# Patient Record
Sex: Female | Born: 1955
Health system: Southern US, Community
[De-identification: ages and names within clinical notes are randomized; demographics above are authoritative.]

## PROBLEM LIST (undated history)

## (undated) DIAGNOSIS — A6000 Herpesviral infection of urogenital system, unspecified: Secondary | ICD-10-CM

## (undated) DIAGNOSIS — E079 Disorder of thyroid, unspecified: Secondary | ICD-10-CM

## (undated) DIAGNOSIS — H3530 Unspecified macular degeneration: Secondary | ICD-10-CM

## (undated) HISTORY — DX: Unspecified macular degeneration: H35.30

## (undated) HISTORY — DX: Disorder of thyroid, unspecified: E07.9

## (undated) HISTORY — DX: Herpesviral infection of urogenital system, unspecified: A60.00

---

## 2002-11-12 ENCOUNTER — Encounter: Payer: Self-pay | Admitting: Family Medicine

## 2002-11-12 ENCOUNTER — Encounter: Admission: RE | Admit: 2002-11-12 | Discharge: 2002-11-12 | Payer: Self-pay | Admitting: Family Medicine

## 2002-11-28 ENCOUNTER — Encounter: Admission: RE | Admit: 2002-11-28 | Discharge: 2002-11-28 | Payer: Self-pay | Admitting: Family Medicine

## 2002-11-28 ENCOUNTER — Encounter: Payer: Self-pay | Admitting: Family Medicine

## 2003-07-24 ENCOUNTER — Encounter: Admission: RE | Admit: 2003-07-24 | Discharge: 2003-07-24 | Payer: Self-pay | Admitting: Family Medicine

## 2003-11-06 ENCOUNTER — Other Ambulatory Visit: Admission: RE | Admit: 2003-11-06 | Discharge: 2003-11-06 | Payer: Self-pay | Admitting: Family Medicine

## 2003-11-25 ENCOUNTER — Encounter: Admission: RE | Admit: 2003-11-25 | Discharge: 2003-11-25 | Payer: Self-pay | Admitting: Family Medicine

## 2004-07-25 ENCOUNTER — Encounter: Admission: RE | Admit: 2004-07-25 | Discharge: 2004-07-25 | Payer: Self-pay | Admitting: Family Medicine

## 2004-09-06 ENCOUNTER — Other Ambulatory Visit: Admission: RE | Admit: 2004-09-06 | Discharge: 2004-09-06 | Payer: Self-pay | Admitting: Family Medicine

## 2005-09-01 ENCOUNTER — Ambulatory Visit (HOSPITAL_COMMUNITY): Admission: RE | Admit: 2005-09-01 | Discharge: 2005-09-01 | Payer: Self-pay | Admitting: Family Medicine

## 2006-07-05 ENCOUNTER — Other Ambulatory Visit: Admission: RE | Admit: 2006-07-05 | Discharge: 2006-07-05 | Payer: Self-pay | Admitting: Family Medicine

## 2006-09-20 ENCOUNTER — Ambulatory Visit (HOSPITAL_COMMUNITY): Admission: RE | Admit: 2006-09-20 | Discharge: 2006-09-20 | Payer: Self-pay | Admitting: Family Medicine

## 2007-09-23 ENCOUNTER — Encounter: Admission: RE | Admit: 2007-09-23 | Discharge: 2007-09-23 | Payer: Self-pay | Admitting: Family Medicine

## 2008-09-24 ENCOUNTER — Ambulatory Visit (HOSPITAL_COMMUNITY): Admission: RE | Admit: 2008-09-24 | Discharge: 2008-09-24 | Payer: Self-pay | Admitting: Family Medicine

## 2009-09-24 ENCOUNTER — Ambulatory Visit (HOSPITAL_COMMUNITY): Admission: RE | Admit: 2009-09-24 | Discharge: 2009-09-24 | Payer: Self-pay | Admitting: Family Medicine

## 2010-07-10 ENCOUNTER — Encounter: Payer: Self-pay | Admitting: Family Medicine

## 2010-08-22 ENCOUNTER — Other Ambulatory Visit (HOSPITAL_COMMUNITY): Payer: Self-pay | Admitting: Family Medicine

## 2010-08-22 DIAGNOSIS — Z1231 Encounter for screening mammogram for malignant neoplasm of breast: Secondary | ICD-10-CM

## 2010-09-26 ENCOUNTER — Ambulatory Visit (HOSPITAL_COMMUNITY): Payer: BC Managed Care – PPO

## 2010-10-03 ENCOUNTER — Ambulatory Visit (HOSPITAL_COMMUNITY)
Admission: RE | Admit: 2010-10-03 | Discharge: 2010-10-03 | Disposition: A | Discharge status: Home / Self Care | Payer: BC Managed Care – PPO | Source: Ambulatory Visit | Attending: Family Medicine | Admitting: Family Medicine

## 2010-10-03 DIAGNOSIS — Z1231 Encounter for screening mammogram for malignant neoplasm of breast: Secondary | ICD-10-CM | POA: Insufficient documentation

## 2011-08-30 ENCOUNTER — Other Ambulatory Visit (HOSPITAL_COMMUNITY): Payer: Self-pay | Admitting: Family Medicine

## 2011-08-30 DIAGNOSIS — Z1231 Encounter for screening mammogram for malignant neoplasm of breast: Secondary | ICD-10-CM

## 2011-10-04 ENCOUNTER — Ambulatory Visit (HOSPITAL_COMMUNITY)
Admission: RE | Admit: 2011-10-04 | Discharge: 2011-10-04 | Disposition: A | Discharge status: Home / Self Care | Payer: BC Managed Care – PPO | Source: Ambulatory Visit | Attending: Family Medicine | Admitting: Family Medicine

## 2011-10-04 DIAGNOSIS — Z1231 Encounter for screening mammogram for malignant neoplasm of breast: Secondary | ICD-10-CM | POA: Insufficient documentation

## 2012-09-02 ENCOUNTER — Other Ambulatory Visit (HOSPITAL_COMMUNITY): Payer: Self-pay | Admitting: Family Medicine

## 2012-09-02 DIAGNOSIS — Z1231 Encounter for screening mammogram for malignant neoplasm of breast: Secondary | ICD-10-CM

## 2012-10-08 ENCOUNTER — Ambulatory Visit (HOSPITAL_COMMUNITY): Payer: BC Managed Care – PPO

## 2013-09-30 ENCOUNTER — Ambulatory Visit (HOSPITAL_COMMUNITY)
Admission: RE | Admit: 2013-09-30 | Discharge: 2013-09-30 | Disposition: A | Discharge status: Home / Self Care | Payer: BC Managed Care – PPO | Source: Ambulatory Visit | Attending: Family Medicine | Admitting: Family Medicine

## 2013-09-30 DIAGNOSIS — Z1231 Encounter for screening mammogram for malignant neoplasm of breast: Secondary | ICD-10-CM

## 2013-11-21 ENCOUNTER — Encounter: Payer: Self-pay | Admitting: Sports Medicine

## 2013-11-21 ENCOUNTER — Ambulatory Visit (INDEPENDENT_AMBULATORY_CARE_PROVIDER_SITE_OTHER): Payer: BC Managed Care – PPO | Admitting: Sports Medicine

## 2013-11-21 VITALS — BP 113/72 | HR 82 | Wt 134.0 lb

## 2013-11-21 DIAGNOSIS — Q667 Congenital pes cavus, unspecified foot: Secondary | ICD-10-CM

## 2013-11-21 DIAGNOSIS — IMO0002 Reserved for concepts with insufficient information to code with codable children: Secondary | ICD-10-CM

## 2013-11-21 DIAGNOSIS — M5416 Radiculopathy, lumbar region: Secondary | ICD-10-CM | POA: Insufficient documentation

## 2013-11-21 MED ORDER — MELOXICAM 15 MG PO TABS: ORAL_TABLET | ORAL | Status: DC

## 2013-11-21 MED ORDER — PREDNISONE 50 MG PO TABS: ORAL_TABLET | ORAL | Status: DC

## 2013-11-21 NOTE — Assessment & Plan Note (Signed)
Custom orthotics.

## 2013-11-21 NOTE — Assessment & Plan Note (Signed)
Prednisone, Mobic, formal physical therapy, x-rays. Return in a month, MRI for interventional injection planning if no better.

## 2013-11-21 NOTE — Progress Notes (Addendum)
    Patient was fitted for a : standard, cushioned, semi-rigid orthotic. The orthotic was heated and afterward the patient stood on the orthotic blank positioned on the orthotic stand. The patient was positioned in subtalar neutral position and 10 degrees of ankle dorsiflexion in a weight bearing stance. After completion of molding, a stable base was applied to the orthotic blank. The blank was ground to a stable position for weight bearing. Size: 9 Base: Blue EVA Additional Posting and Padding: None The patient ambulated these, and they were very comfortable.  I spent 45 minutes with this patient, greater than 50% was face-to-face time counseling regarding the below diagnosis.

## 2013-12-10 ENCOUNTER — Ambulatory Visit: Payer: BC Managed Care – PPO | Admitting: Physical Therapy

## 2013-12-11 ENCOUNTER — Telehealth: Payer: Self-pay | Admitting: *Deleted

## 2013-12-11 NOTE — Telephone Encounter (Signed)
The orthotic is molded exactly to her foot so its likely perfect, I don't think i'd make another set any differently.  I would recommend making her shoe really right since if the foot slides around it can pull the sock down.  Also maybe a taller sock? Or no Socks?

## 2013-12-11 NOTE — Telephone Encounter (Signed)
Pt called and said she got orthotics from you a couple weeks ago and no matter what shoes or socks she tries her sock is pulled down by orthotic.  Does she need a new one made?  KG CMA

## 2013-12-22 ENCOUNTER — Ambulatory Visit: Payer: BC Managed Care – PPO | Admitting: Sports Medicine

## 2014-09-04 ENCOUNTER — Other Ambulatory Visit (HOSPITAL_COMMUNITY): Payer: Self-pay | Admitting: Family Medicine

## 2014-09-04 DIAGNOSIS — Z1231 Encounter for screening mammogram for malignant neoplasm of breast: Secondary | ICD-10-CM

## 2014-10-07 ENCOUNTER — Ambulatory Visit (HOSPITAL_COMMUNITY)
Admission: RE | Admit: 2014-10-07 | Discharge: 2014-10-07 | Disposition: A | Discharge status: Home / Self Care | Payer: BC Managed Care – PPO | Source: Ambulatory Visit | Attending: Family Medicine | Admitting: Family Medicine

## 2014-10-07 DIAGNOSIS — Z1231 Encounter for screening mammogram for malignant neoplasm of breast: Secondary | ICD-10-CM | POA: Insufficient documentation

## 2015-04-14 ENCOUNTER — Encounter: Payer: Self-pay | Admitting: Sports Medicine

## 2015-04-14 ENCOUNTER — Ambulatory Visit (INDEPENDENT_AMBULATORY_CARE_PROVIDER_SITE_OTHER): Payer: BC Managed Care – PPO | Admitting: Sports Medicine

## 2015-04-14 VITALS — BP 143/59 | HR 86 | Wt 138.0 lb

## 2015-04-14 DIAGNOSIS — M25531 Pain in right wrist: Secondary | ICD-10-CM | POA: Diagnosis not present

## 2015-04-14 MED ORDER — NAPROXEN-ESOMEPRAZOLE 500-20 MG PO TBEC: 1.0000 | DELAYED_RELEASE_TABLET | Freq: Two times a day (BID) | ORAL | Status: DC

## 2015-04-14 NOTE — Assessment & Plan Note (Signed)
Multifactorial, there is a degree of osteoarthritis, and likely some carpal tunnel syndrome. She will wear her wrist brace at night and when possible work, adding Vimovo samples due to history of PUD, and formal physical therapy.  Declines x-ray, return in one month.

## 2015-04-14 NOTE — Progress Notes (Signed)
  Subjective:    CC: Right wrist pain  HPI: This is a pleasant 59 year old female, for the past month she has had pain that she localizes on the volar aspect of the right wrist, moderate, persistent with radiation into the forearm, no numbness or tingling into the hands but she simply feels tight. She has a job consists of repetitive motions, she did file a Sport and exercise psychologist. claim and was denied. Seen by nurse practitioner and diagnosed as tendinitis and given a wrist brace. She was taken out of work, currently she wants to return to work, wrist brace helps, not taking any NSAIDs, tells me she does have a history of peptic ulcer disease.  Past medical history, Surgical history, Family history not pertinant except as noted below, Social history, Allergies, and medications have been entered into the medical record, reviewed, and no changes needed.   Review of Systems: No fevers, chills, night sweats, weight loss, chest pain, or shortness of breath.   Objective:    General: Well Developed, well nourished, and in no acute distress.  Neuro: Alert and oriented x3, extra-ocular muscles intact, sensation grossly intact.  HEENT: Normocephalic, atraumatic, pupils equal round reactive to light, neck supple, no masses, no lymphadenopathy, thyroid nonpalpable.  Skin: Warm and dry, no rashes. Cardiac: Regular rate and rhythm, no murmurs rubs or gallops, no lower extremity edema.  Respiratory: Clear to auscultation bilaterally. Not using accessory muscles, speaking in full sentences. Right Wrist: Inspection normal with no visible erythema or swelling. ROM smooth and normal with good flexion and extension and ulnar/radial deviation that is symmetrical with opposite wrist. Minimal tenderness to palpation at the scapholunate joint on the volar aspect. No snuffbox tenderness. No tenderness over Canal of [place]. Strength 5/5 in all directions without pain. Negative Finkelstein, tinel's and phalens. Negative  Watson's test.  Impression and Recommendations:

## 2015-04-16 ENCOUNTER — Ambulatory Visit: Payer: BC Managed Care – PPO | Admitting: Sports Medicine

## 2015-04-20 ENCOUNTER — Ambulatory Visit: Payer: BC Managed Care – PPO | Admitting: Rehabilitative and Restorative Service Providers"

## 2015-04-22 ENCOUNTER — Encounter: Payer: Self-pay | Admitting: Rehabilitative and Restorative Service Providers"

## 2015-04-22 ENCOUNTER — Ambulatory Visit (INDEPENDENT_AMBULATORY_CARE_PROVIDER_SITE_OTHER): Payer: BC Managed Care – PPO | Admitting: Rehabilitative and Restorative Service Providers"

## 2015-04-22 DIAGNOSIS — M2560 Stiffness of unspecified joint, not elsewhere classified: Secondary | ICD-10-CM

## 2015-04-22 DIAGNOSIS — Z7409 Other reduced mobility: Secondary | ICD-10-CM

## 2015-04-22 DIAGNOSIS — M623 Immobility syndrome (paraplegic): Secondary | ICD-10-CM

## 2015-04-22 DIAGNOSIS — R293 Abnormal posture: Secondary | ICD-10-CM

## 2015-04-22 DIAGNOSIS — R531 Weakness: Secondary | ICD-10-CM

## 2015-04-22 NOTE — Therapy (Signed)
[place], [place], [address] Phone: [phone]   Fax:  [phone]  Physical Therapy Evaluation  Patient Details  Name: Sheila Harris MRN: [id number] Date of Birth: 17-Jan-1956 Referring Provider: Dr. Dianah Field  Encounter Date: 04/22/2015      PT End of Session - 04/22/15 0932    Visit Number 1   Number of Visits 6   Date for PT Re-Evaluation 06/03/15   PT Start Time 0917   PT Stop Time 1000   PT Time Calculation (min) 43 min   Activity Tolerance Patient tolerated treatment well      Past Medical History  Diagnosis Date  . Genital herpes   . Thyroid disease     History reviewed. No pertinent past surgical history.  There were no vitals filed for this visit.  Visit Diagnosis:  Abnormal posture - Plan: PT plan of care cert/re-cert  Stiffness due to immobility - Plan: PT plan of care cert/re-cert  Decreased strength, endurance, and mobility - Plan: PT plan of care cert/re-cert      Subjective Assessment - 04/22/15 0922    Subjective patient reports pain and discomfort in Rt forearm; wrist; hand which has been present for the past 6-12 months. She has seen several doctors with no resolution.    Pertinent History History of neck and back problems sees a chiropractor PRN   Currently in Pain? No/denies   Pain Location Wrist   Pain Orientation Right   Pain Descriptors / Indicators --  Stiffness   Pain Type Chronic pain   Pain Radiating Towards forearm; wrist; hand; fingers   Pain Onset More than a month ago   Pain Frequency Intermittent   Aggravating Factors  repetitive movements; lifting/grasping   Pain Relieving Factors deep tissue work; stretching            [facility] PT Assessment - 04/22/15 0001    Assessment   Medical Diagnosis Rt wrist pain    Referring Provider Dr. Dianah Field   Onset Date/Surgical Date 05/04/15   Hand Dominance Right   Next MD Visit 05/10/15    Prior Therapy none   Precautions   Precautions None   Balance Screen   Has the patient fallen in the past 6 months No   Has the patient had a decrease in activity level because of a fear of falling?  No   Is the patient reluctant to leave their home because of a fear of falling?  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; grasping; mopping; sweeping; reaching for ~5 years   Leisure household chores; cleaning; shopping   Observation/Other Assessments   Focus on Therapeutic Outcomes (FOTO)  37% limitation   Sensation   Additional Comments WFL's per patient report   Functional Tests   Functional tests --  (+) neural tension test bilat median nerve    AROM   Overall AROM Comments ROM shd/elbow/wrist/hand bilat WNL's throughout    Strength   Overall Strength Comments Bilat shoulder/elbow/wrist bilat 5/5 throughout   Right Hand Grip (lbs) 46   Right Hand Lateral Pinch 7.5 lbs   Right Hand 3 Point Pinch 8 lbs   Left Hand Grip (lbs) 49   Left Hand Lateral Pinch 8.5 lbs   Left Hand 3 Point Pinch 8 lbs   Palpation   Palpation comment tightness through the extensor forearm/slightly  through flexor forearm Rt                   [facility] Adult PT Treatment/Exercise - 04/22/15 0001    Exercises   Exercises --  wrist flexor stretch 30 sec x3 palm at wall   Shoulder Exercises: Standing   Other Standing Exercises chest lift    Shoulder Exercises: Stretch   Corner Stretch Limitations doorway 3 positions 30 sec x3    Neck Exercises: Stretches   Neck Stretch Limitations axial extension supine 10 sec x 5    Other Neck Stretches neural tension stretch bilat 1 min x2 reps                PT Education - 04/22/15 1010    Education provided Yes   Education Details postural education; HEP   Person(s) Educated Patient   Methods Explanation;Demonstration;Tactile  cues;Verbal cues;Handout   Comprehension Verbalized understanding;Returned demonstration;Verbal cues required;Tactile cues required             PT Long Term Goals - 04/22/15 1412    PT LONG TERM GOAL #2   Title Patient I in HEP at discharge 06/03/15   Time 6   Period Weeks   Status New   PT LONG TERM GOAL #3   Title Improve FOTO to </= 25% limitation 06/03/15   Time 6   Period Weeks               Plan - 04/22/15 1406    Clinical Impression Statement Sheila Harris presents with Rt UE stiffness and soreness in forearm; wrist; hand and fingers. She has poor posture and alighment; poor scapular stability and positioning; tightness to palpatioin through forearm musculature on RT and positive neural tension test through median nerve distribution bilaterally. ROM and strength are WFL's bilat UE's. We will schedule patient for trial of PT to address posture and posterior shoulder stability as well as a trial of dry needling to Rt forearm tightness.    Pt will benefit from skilled therapeutic intervention in order to improve on the following deficits Postural dysfunction;Improper body mechanics;Decreased activity tolerance;Increased fascial restricitons;Decreased strength   Rehab Potential Good   PT Frequency 1x / week   PT Duration 6 weeks   PT Treatment/Interventions Patient/family education;ADLs/Self Care Home Management;Therapeutic exercise;Therapeutic activities;Neuromuscular re-education;Dry needling;Cryotherapy;Electrical Stimulation;Moist Heat;Ultrasound;Manual techniques   PT Next Visit Plan trial of dry needling; check stretches; progress with posterior shoulder girdle strengthening   PT Home Exercise Plan postural correction; HEP   Consulted and Agree with Plan of Care Patient         Problem List Patient Active Problem List   Diagnosis Date Noted  . Right wrist pain 04/14/2015  . Pes cavus 11/21/2013  . Right lumbar radiculopathy 11/21/2013    Noland Pizano Nilda Simmer PT,  MPH 04/22/2015, 2:17 PM  [facility] [place], [place], [address] Phone: [phone]   Fax:  [phone]  Name: Sheila Harris MRN: [id number] Date of Birth: 08-Aug-1955

## 2015-04-22 NOTE — Patient Instructions (Signed)
Neurovascular: Median Nerve Glide With Cervical Bias - Supine    Lie with neck supported, right arm out to side, elbow straight, thumb down, fingers and wrist bent back. Hold 1 minute Repeat __2__ times per set. Do _2___ sets per session. Do __2__ sessions per day.  Axial Extension (Chin Tuck)    Pull chin in and lengthen back of neck. Hold __10-15__ seconds while counting out loud. Repeat __5-10__ times. Do _several ___ sessions per day. Lying; sitting and standing   Scapula Adduction With Pectoralis Stretch: Low - Standing   Shoulders at 45 hands even with shoulders, keeping weight through legs, shift weight forward until you feel pull or stretch through the front of your chest. Hold _30__ seconds. Do _3__ times, _2-4__ times per day.   Scapula Adduction With Pectoralis Stretch: Mid-Range - Standing   Shoulders at 90 elbows even with shoulders, keeping weight through legs, shift weight forward until you feel pull or strength through the front of your chest. Hold __30_ seconds. Do _3__ times, __2-4_ times per day.   Scapula Adduction With Pectoralis Stretch: High - Standing   Shoulders at 120 hands up high on the doorway, keeping weight on feet, shift weight forward until you feel pull or stretch through the front of your chest. Hold _30__ seconds. Do _3__ times, _2-3__ times per day.

## 2015-04-27 ENCOUNTER — Encounter: Payer: BC Managed Care – PPO | Admitting: Physical Therapy

## 2015-04-27 ENCOUNTER — Encounter: Payer: BC Managed Care – PPO | Admitting: Rehabilitative and Restorative Service Providers"

## 2015-04-28 ENCOUNTER — Ambulatory Visit (INDEPENDENT_AMBULATORY_CARE_PROVIDER_SITE_OTHER): Payer: BC Managed Care – PPO | Admitting: Physical Therapy

## 2015-04-28 ENCOUNTER — Encounter: Payer: Self-pay | Admitting: Physical Therapy

## 2015-04-28 DIAGNOSIS — R293 Abnormal posture: Secondary | ICD-10-CM | POA: Diagnosis not present

## 2015-04-28 DIAGNOSIS — Z7409 Other reduced mobility: Secondary | ICD-10-CM

## 2015-04-28 DIAGNOSIS — M623 Immobility syndrome (paraplegic): Secondary | ICD-10-CM | POA: Diagnosis not present

## 2015-04-28 DIAGNOSIS — M2560 Stiffness of unspecified joint, not elsewhere classified: Secondary | ICD-10-CM

## 2015-04-28 DIAGNOSIS — R531 Weakness: Secondary | ICD-10-CM

## 2015-04-28 NOTE — Therapy (Signed)
[place], [place], [address] Phone: [phone]   Fax:  [phone]  Physical Therapy Treatment  Patient Details  Name: Sheila Harris MRN: [id number] Date of Birth: Dec 20, 1955 Referring Provider: Dr. Dianah Field  Encounter Date: 04/28/2015      PT End of Session - 04/28/15 1023    Visit Number 2   Number of Visits 6   Date for PT Re-Evaluation 06/03/15   PT Start Time 1018   PT Stop Time 1103   PT Time Calculation (min) 45 min   Activity Tolerance Patient tolerated treatment well      Past Medical History  Diagnosis Date  . Genital herpes   . Thyroid disease     History reviewed. No pertinent past surgical history.  There were no vitals filed for this visit.  Visit Diagnosis:  Abnormal posture  Stiffness due to immobility  Decreased strength, endurance, and mobility      Subjective Assessment - 04/28/15 1021    Subjective Pt reports no change since her last visit, she did get a brace for her trigger finger and it seems to help. Still wearing a wrist brace while working and seems to have some relief with that.    Currently in Pain? No/denies                         [facility] Adult PT Treatment/Exercise - 04/28/15 0001    Elbow Exercises   Elbow Flexion --  stretching wrist flex/ext   Modalities   Modalities Moist Heat   Moist Heat Therapy   Number Minutes Moist Heat 15 Minutes   Moist Heat Location --  Rt forearm   Manual Therapy   Manual Therapy Soft tissue mobilization   Soft tissue mobilization Rt forearm and hand interossei between 3rd and 4th finger.           Trigger Point Dry Needling - 04/28/15 1053    Consent Given? Yes   Education Handout Provided Yes   Muscles Treated Upper Body --  common extensor muscles in Rt forearm, flexor carpi ulnaris,                   PT Long Term Goals - 04/22/15 1412    PT LONG TERM GOAL #2   Title  Patient I in HEP at discharge 06/03/15   Time 6   Period Weeks   Status New   PT LONG TERM GOAL #3   Title Improve FOTO to </= 25% limitation 06/03/15   Time 6   Period Weeks               Plan - 04/28/15 1056    Clinical Impression Statement This is patients second visit, no goals met . She had good twitch responses to TDN in the Rt forearm.     Pt will benefit from skilled therapeutic intervention in order to improve on the following deficits Postural dysfunction;Improper body mechanics;Decreased activity tolerance;Increased fascial restricitons;Decreased strength   Rehab Potential Good   PT Frequency 1x / week   PT Duration 6 weeks   PT Treatment/Interventions Patient/family education;ADLs/Self Care Home Management;Therapeutic exercise;Therapeutic activities;Neuromuscular re-education;Dry needling;Cryotherapy;Electrical Stimulation;Moist Heat;Ultrasound;Manual techniques   PT Next Visit Plan assess response to TDN   Consulted and Agree with Plan of Care Patient        Problem List Patient Active Problem List   Diagnosis Date Noted  . Right wrist pain 04/14/2015  .  Pes cavus 11/21/2013  . Right lumbar radiculopathy 11/21/2013    Jeral Pinch PT 04/28/2015, 10:58 AM  [facility] [place], [place], [address] Phone: [phone]   Fax:  [phone]  Name: Sheila Harris MRN: [id number] Date of Birth: 1955/10/30

## 2015-04-28 NOTE — Patient Instructions (Signed)

## 2015-05-04 ENCOUNTER — Encounter: Payer: BC Managed Care – PPO | Admitting: Physical Therapy

## 2015-05-10 ENCOUNTER — Ambulatory Visit: Payer: BC Managed Care – PPO | Admitting: Sports Medicine

## 2015-05-12 ENCOUNTER — Ambulatory Visit: Payer: BC Managed Care – PPO | Admitting: Sports Medicine

## 2015-05-12 ENCOUNTER — Ambulatory Visit (INDEPENDENT_AMBULATORY_CARE_PROVIDER_SITE_OTHER): Payer: BC Managed Care – PPO | Admitting: Physical Therapy

## 2015-05-12 DIAGNOSIS — Z7409 Other reduced mobility: Secondary | ICD-10-CM | POA: Diagnosis not present

## 2015-05-12 DIAGNOSIS — R531 Weakness: Secondary | ICD-10-CM

## 2015-05-12 DIAGNOSIS — R293 Abnormal posture: Secondary | ICD-10-CM

## 2015-05-12 DIAGNOSIS — M2560 Stiffness of unspecified joint, not elsewhere classified: Secondary | ICD-10-CM

## 2015-05-12 DIAGNOSIS — M623 Immobility syndrome (paraplegic): Secondary | ICD-10-CM | POA: Diagnosis not present

## 2015-05-12 NOTE — Patient Instructions (Addendum)
Over Head Pull: Narrow Grip     [place] [phone]   On back, knees bent, feet flat, band across thighs, elbows straight but relaxed. Pull hands apart (start). Keeping elbows straight, bring arms up and over head, hands toward floor. Keep pull steady on band. Hold momentarily. Return slowly, keeping pull steady, back to start. Repeat _2x10__ times. Band color __red____   Side Pull: Double Arm   On back, knees bent, feet flat. Arms perpendicular to body, shoulder level, elbows straight but relaxed. Pull arms out to sides, elbows straight. Resistance band comes across collarbones, hands toward floor. Hold momentarily. Slowly return to starting position. Repeat _2x10__ times. Band color _red____   Elmer Picker   On back, knees bent, feet flat, left hand on left hip, right hand above left. Pull right arm DIAGONALLY (hip to shoulder) across chest. Bring right arm along head toward floor. Hold momentarily. Slowly return to starting position. Repeat _2x10__ times. Do with left arm. Band color __red____   Shoulder Rotation: Double Arm   On back, knees bent, feet flat, elbows tucked at sides, bent 90, hands palms up. Pull hands apart and down toward floor, keeping elbows near sides. Hold momentarily. Slowly return to starting position. Repeat _2x10__ times. Band color _red_____    Finger waves x10Protrusion    Yoga for the jaw.

## 2015-05-12 NOTE — Therapy (Addendum)
[place], [place], [address] Phone: [phone]   Fax:  [phone]  Physical Therapy Treatment  Patient Details  Name: Sheila Harris MRN: [id number] Date of Birth: Dec 03, 1955 Referring Provider: Dr. Dianah Field  Encounter Date: 05/12/2015      PT End of Session - 05/12/15 0942    Visit Number 3   Number of Visits 6   Date for PT Re-Evaluation 06/03/15   PT Start Time 0933   PT Stop Time 1027   PT Time Calculation (min) 54 min   Activity Tolerance Patient tolerated treatment well      Past Medical History  Diagnosis Date  . Genital herpes   . Thyroid disease     No past surgical history on file.  There were no vitals filed for this visit.  Visit Diagnosis:  Abnormal posture  Stiffness due to immobility  Decreased strength, endurance, and mobility      Subjective Assessment - 05/12/15 0934    Subjective Pt was very sore for about 5 days after needling, now she feels a lot better, even her finger feels better. Stopped wearing the finger brace.    Pertinent History Went to chiropractor due to some vertigo he released the Rt side of her neck to release this area     Patient Stated Goals decrease pain   Currently in Pain? No/denies  nothing in the Rt arm                         [facility] Adult PT Treatment/Exercise - 05/12/15 0001    Self-Care   Self-Care --  yoga for the jaw/TMJ   Exercises   Exercises Shoulder;Hand   Shoulder Exercises: Supine   Horizontal ABduction Strengthening;Both;20 reps;Theraband   Theraband Level (Shoulder Horizontal ABduction) Level 2 (Red)   External Rotation Strengthening;Both;20 reps;Theraband   Theraband Level (Shoulder External Rotation) Level 2 (Red)   Flexion Strengthening;Both;20 reps;Theraband  over head   Shoulder Exercises: Seated   Other Seated Exercises SASH x20 with red band   Hand Exercises   Other Hand Exercises  finger waves Rt hand   Moist Heat Therapy   Number Minutes Moist Heat 15 Minutes   Moist Heat Location Cervical                PT Education - 05/12/15 0948    Education provided Yes   Education Details HEP   Person(s) Educated Patient   Methods Explanation;Demonstration;Handout   Comprehension Returned demonstration;Verbalized understanding             PT Long Term Goals - 05/12/15 0940    PT LONG TERM GOAL #1   Title Improve posture and alignment 06/03/15   Status On-going   PT LONG TERM GOAL #2   Title Patient I in HEP at discharge 06/03/15   Status On-going   PT LONG TERM GOAL #3   Title Improve FOTO to </= 25% limitation 06/03/15   Status On-going   PT LONG TERM GOAL #4   Status --               Plan - 05/12/15 0943    Clinical Impression Statement Sheila Harris had a lot of soreness in her Rt forearm/hand after her last treatment.  Once this settled down her overall pain was much improved and she is having less symptoms/problems with her trigger finger also.  She is now having more issues with her neck and  TMJ. These are long standing problems.  She is progressing towards her goals.    Pt will benefit from skilled therapeutic intervention in order to improve on the following deficits Postural dysfunction;Improper body mechanics;Decreased activity tolerance;Increased fascial restricitons;Decreased strength   Rehab Potential Good   PT Frequency 1x / week   PT Duration 6 weeks   PT Treatment/Interventions Patient/family education;ADLs/Self Care Home Management;Therapeutic exercise;Therapeutic activities;Neuromuscular re-education;Dry needling;Cryotherapy;Electrical Stimulation;Moist Heat;Ultrasound;Manual techniques   PT Next Visit Plan see one more visit and see if she is still doing well with forearm after adding in ther ex.    Consulted and Agree with Plan of Care Patient        Problem List Patient Active Problem List   Diagnosis Date Noted  . Right  wrist pain 04/14/2015  . Pes cavus 11/21/2013  . Right lumbar radiculopathy 11/21/2013    Jeral Pinch PT 05/12/2015, 10:15 AM  [facility] [address] [place], [place], [address] Phone: [phone]   Fax:  [phone]  Name: Sheila Harris MRN: [id number] Date of Birth: Mar 21, 1956    PHYSICAL THERAPY DISCHARGE SUMMARY  Visits from Start of Care: 3  Current functional level related to goals / functional outcomes: See above   Remaining deficits: Patient reports she is doing well and pleased with her progress   Education / Equipment: HEP  Plan: Patient agrees to discharge.  Patient goals were not met. Patient is being discharged due to being pleased with the current functional level.  ?????   Jeral Pinch, PT 06/10/2015 3:51 PM

## 2015-05-18 ENCOUNTER — Ambulatory Visit: Payer: BC Managed Care – PPO | Admitting: Sports Medicine

## 2015-05-18 ENCOUNTER — Encounter: Payer: BC Managed Care – PPO | Admitting: Physical Therapy

## 2015-05-20 ENCOUNTER — Ambulatory Visit: Payer: BC Managed Care – PPO | Admitting: Sports Medicine

## 2015-05-24 ENCOUNTER — Other Ambulatory Visit: Payer: Self-pay | Admitting: Sports Medicine

## 2015-05-24 DIAGNOSIS — M25531 Pain in right wrist: Secondary | ICD-10-CM

## 2015-05-24 MED ORDER — NAPROXEN-ESOMEPRAZOLE 500-20 MG PO TBEC: 1.0000 | DELAYED_RELEASE_TABLET | Freq: Two times a day (BID) | ORAL | Status: DC

## 2015-05-26 ENCOUNTER — Ambulatory Visit: Payer: BC Managed Care – PPO | Admitting: Sports Medicine

## 2015-05-26 ENCOUNTER — Encounter: Payer: BC Managed Care – PPO | Admitting: Physical Therapy

## 2015-05-27 ENCOUNTER — Other Ambulatory Visit: Payer: Self-pay

## 2015-05-27 DIAGNOSIS — M25531 Pain in right wrist: Secondary | ICD-10-CM

## 2015-05-27 MED ORDER — NAPROXEN-ESOMEPRAZOLE 500-20 MG PO TBEC: 1.0000 | DELAYED_RELEASE_TABLET | Freq: Two times a day (BID) | ORAL | Status: DC

## 2015-07-13 ENCOUNTER — Ambulatory Visit: Payer: BC Managed Care – PPO | Admitting: Family Medicine

## 2015-07-27 ENCOUNTER — Ambulatory Visit (INDEPENDENT_AMBULATORY_CARE_PROVIDER_SITE_OTHER): Payer: BC Managed Care – PPO | Admitting: Sports Medicine

## 2015-07-27 ENCOUNTER — Encounter: Payer: Self-pay | Admitting: Sports Medicine

## 2015-07-27 VITALS — BP 117/71 | HR 75 | Resp 16 | Wt 138.9 lb

## 2015-07-27 DIAGNOSIS — M216X9 Other acquired deformities of unspecified foot: Secondary | ICD-10-CM | POA: Diagnosis not present

## 2015-07-27 NOTE — Assessment & Plan Note (Signed)
Second set of custom orthotics as above.

## 2015-07-27 NOTE — Progress Notes (Signed)

## 2015-08-31 ENCOUNTER — Other Ambulatory Visit: Payer: Self-pay

## 2015-08-31 DIAGNOSIS — Z1231 Encounter for screening mammogram for malignant neoplasm of breast: Secondary | ICD-10-CM

## 2015-09-22 ENCOUNTER — Other Ambulatory Visit: Payer: Self-pay | Admitting: Family Medicine

## 2015-09-22 DIAGNOSIS — M810 Age-related osteoporosis without current pathological fracture: Secondary | ICD-10-CM

## 2015-09-27 ENCOUNTER — Ambulatory Visit: Payer: BC Managed Care – PPO | Admitting: Sports Medicine

## 2015-10-08 ENCOUNTER — Ambulatory Visit (INDEPENDENT_AMBULATORY_CARE_PROVIDER_SITE_OTHER): Payer: BC Managed Care – PPO | Admitting: Sports Medicine

## 2015-10-08 ENCOUNTER — Encounter: Payer: Self-pay | Admitting: Sports Medicine

## 2015-10-08 ENCOUNTER — Ambulatory Visit: Payer: BC Managed Care – PPO | Admitting: Sports Medicine

## 2015-10-08 VITALS — BP 109/70 | HR 71 | Wt 137.0 lb

## 2015-10-08 DIAGNOSIS — M6530 Trigger finger, unspecified finger: Secondary | ICD-10-CM | POA: Insufficient documentation

## 2015-10-08 NOTE — Progress Notes (Signed)
   Subjective:    I'm seeing this patient as a consultation for:  Dr. Marjean Donna  CC: Left hand pain  HPI: For weeks this pleasant 60 year old female has had pain that she localizes on the volar aspect of her left hand with occasional triggering of her fourth finger, symptoms are moderate, persistent, no radiation. 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  Past medical history, Surgical history, Family history not pertinant except as noted below, Social history, Allergies, and medications have been entered into the medical record, reviewed, and no changes needed.   Review of Systems: No headache, visual changes, nausea, vomiting, diarrhea, constipation, dizziness, abdominal pain, skin rash, fevers, chills, night sweats, weight loss, swollen lymph nodes, body aches, joint swelling, muscle aches, chest pain, shortness of breath, mood changes, visual or auditory hallucinations.   Objective:   General: Well Developed, well nourished, and in no acute distress.  Neuro/Psych: Alert and oriented x3, extra-ocular muscles intact, able to move all 4 extremities, sensation grossly intact. Skin: Warm and dry, no rashes noted.  Respiratory: Not using accessory muscles, speaking in full sentences, trachea midline.  Cardiovascular: Pulses palpable, no extremity edema. Abdomen: Does not appear distended. Left hand: Tender to palpation over the A1 pulley with palpable nodule, no visible triggering.  Procedure: Real-time Ultrasound Guided Injection of left fourth flexor tendon sheath Device: GE Logiq E  Verbal informed consent obtained.  Time-out conducted.  Noted no overlying erythema, induration, or other signs of local infection.  Skin prepped in a sterile fashion.  Local anesthesia: Topical Ethyl chloride.  With sterile technique and under real time ultrasound guidance:  25-gauge needle advanced under the A1 pulley, taking care to avoid intratendinous injection I placed 1/2  mL kenalog 40, 1/2 mL lidocaine into the flexor tendon sheath. Completed without difficulty  Pain immediately resolved suggesting accurate placement of the medication.  Advised to call if fevers/chills, erythema, induration, drainage, or persistent bleeding.  Images permanently stored and available for review in the ultrasound unit.  Impression: Technically successful ultrasound guided injection.  Impression and Recommendations:   This case required medical decision making of moderate complexity.

## 2015-10-08 NOTE — Assessment & Plan Note (Signed)
Ultrasound guided injection as above.

## 2015-10-12 ENCOUNTER — Ambulatory Visit (INDEPENDENT_AMBULATORY_CARE_PROVIDER_SITE_OTHER): Payer: BC Managed Care – PPO | Admitting: Sports Medicine

## 2015-10-12 VITALS — BP 106/69 | HR 75 | Resp 16 | Wt 137.2 lb

## 2015-10-12 DIAGNOSIS — M216X9 Other acquired deformities of unspecified foot: Secondary | ICD-10-CM | POA: Diagnosis not present

## 2015-10-12 NOTE — Progress Notes (Signed)

## 2015-10-12 NOTE — Assessment & Plan Note (Signed)
New custom orthotics created, arch was a bit high on the previous. Less aggressive arch today.

## 2015-10-12 NOTE — Addendum Note (Signed)
Addended by: Elizabeth Sauer on: 10/12/2015 10:41 AM   Modules accepted: Medications

## 2015-10-13 ENCOUNTER — Ambulatory Visit: Payer: BC Managed Care – PPO

## 2015-11-02 ENCOUNTER — Ambulatory Visit
Admission: RE | Admit: 2015-11-02 | Discharge: 2015-11-02 | Disposition: A | Discharge status: Home / Self Care | Payer: BC Managed Care – PPO | Source: Ambulatory Visit

## 2015-11-02 ENCOUNTER — Ambulatory Visit
Admission: RE | Admit: 2015-11-02 | Discharge: 2015-11-02 | Disposition: A | Discharge status: Home / Self Care | Payer: BC Managed Care – PPO | Source: Ambulatory Visit | Attending: Family Medicine | Admitting: Family Medicine

## 2015-11-02 DIAGNOSIS — M810 Age-related osteoporosis without current pathological fracture: Secondary | ICD-10-CM

## 2015-11-02 DIAGNOSIS — Z1231 Encounter for screening mammogram for malignant neoplasm of breast: Secondary | ICD-10-CM

## 2015-12-30 ENCOUNTER — Ambulatory Visit (INDEPENDENT_AMBULATORY_CARE_PROVIDER_SITE_OTHER): Payer: BC Managed Care – PPO | Admitting: Sports Medicine

## 2015-12-30 ENCOUNTER — Encounter: Payer: Self-pay | Admitting: Sports Medicine

## 2015-12-30 VITALS — BP 128/75 | HR 86 | Resp 18 | Wt 140.6 lb

## 2015-12-30 DIAGNOSIS — M19041 Primary osteoarthritis, right hand: Secondary | ICD-10-CM

## 2015-12-30 DIAGNOSIS — M5030 Other cervical disc degeneration, unspecified cervical region: Secondary | ICD-10-CM | POA: Insufficient documentation

## 2015-12-30 DIAGNOSIS — M5412 Radiculopathy, cervical region: Secondary | ICD-10-CM | POA: Diagnosis not present

## 2015-12-30 NOTE — Assessment & Plan Note (Signed)
Injection of the right third PIP

## 2015-12-30 NOTE — Progress Notes (Signed)
   Subjective:    I'm seeing this patient as a consultation for:  Dr. Orpah Melter  CC: Hand pain, right arm pain  HPI: This is a pleasant 60 year old female, for several months she's had pain that she localizes at the right third proximal interphalangeal joint. Moderate, persistent without radiation.  Right arm pain: Also notes some mild weakness, as well as with pain radiating from the right upper chest down the lateral aspect of the right forearm to the dorsum of the second and third fingers. No bowel or bladder dysfunction, no lower extremity symptoms.  Past medical history, Surgical history, Family history not pertinant except as noted below, Social history, Allergies, and medications have been entered into the medical record, reviewed, and no changes needed.   Review of Systems: No headache, visual changes, nausea, vomiting, diarrhea, constipation, dizziness, abdominal pain, skin rash, fevers, chills, night sweats, weight loss, swollen lymph nodes, body aches, joint swelling, muscle aches, chest pain, shortness of breath, mood changes, visual or auditory hallucinations.   Objective:   General: Well Developed, well nourished, and in no acute distress.  Neuro/Psych: Alert and oriented x3, extra-ocular muscles intact, able to move all 4 extremities, sensation grossly intact. Skin: Warm and dry, no rashes noted.  Respiratory: Not using accessory muscles, speaking in full sentences, trachea midline.  Cardiovascular: Pulses palpable, no extremity edema. Abdomen: Does not appear distended. Right hand: Tender to palpation over the third proximal interphalangeal joint. Neck: Negative spurling's Full neck range of motion Grip strength and sensation normal in bilateral hands Strength good C4 to T1 distribution No sensory change to C4 to T1 Reflexes normal  Procedure: Real-time Ultrasound Guided Injection of right third proximal interphalangeal joint Device: GE Logiq E  Verbal informed  consent obtained.  Time-out conducted.  Noted no overlying erythema, induration, or other signs of local infection.  Skin prepped in a sterile fashion.  Local anesthesia: Topical Ethyl chloride.  With sterile technique and under real time ultrasound guidance:  1/2 mL kenalog 40, 1/2 mL lidocaine injected easily. Completed without difficulty  Pain immediately resolved suggesting accurate placement of the medication.  Advised to call if fevers/chills, erythema, induration, drainage, or persistent bleeding.  Images permanently stored and available for review in the ultrasound unit.  Impression: Technically successful ultrasound guided injection.  Impression and Recommendations:   This case required medical decision making of moderate complexity.

## 2015-12-30 NOTE — Assessment & Plan Note (Signed)
Physical therapy, x-rays shows severe multilevel degenerative disc disease. If no improvement after one month we will proceed with MRI.

## 2016-01-04 ENCOUNTER — Ambulatory Visit (INDEPENDENT_AMBULATORY_CARE_PROVIDER_SITE_OTHER): Payer: BC Managed Care – PPO | Admitting: Physical Therapy

## 2016-01-04 ENCOUNTER — Encounter: Payer: Self-pay | Admitting: Physical Therapy

## 2016-01-04 DIAGNOSIS — R293 Abnormal posture: Secondary | ICD-10-CM

## 2016-01-04 DIAGNOSIS — M5412 Radiculopathy, cervical region: Secondary | ICD-10-CM | POA: Diagnosis not present

## 2016-01-04 DIAGNOSIS — M6281 Muscle weakness (generalized): Secondary | ICD-10-CM | POA: Diagnosis not present

## 2016-01-04 NOTE — Patient Instructions (Signed)
Head Press With [place]     Tuck chin SLIGHTLY toward chest, keep mouth closed. Feel weight on back of head. Increase weight by pressing head down. Hold _5-10__ seconds. Relax. Repeat _10__ times. Once a day. Surface: floor   Over Head Pull: Narrow Grip       On back, knees bent, feet flat, band across thighs, elbows straight but relaxed. Pull hands apart (start). Keeping elbows straight, bring arms up and over head, hands toward floor. Keep pull steady on band. Hold momentarily. Return slowly, keeping pull steady, back to start. Repeat _3x10__ times. Band color ___green___ , once a day  Side Pull: Double Arm   On back, knees bent, feet flat. Arms perpendicular to body, shoulder level, elbows straight but relaxed. Pull arms out to sides, elbows straight. Resistance band comes across collarbones, hands toward floor. Hold momentarily. Slowly return to starting position. Repeat _3x10__ times. Band color __green___ , once a day  Sash   On back, knees bent, feet flat, left hand on left hip, right hand above left. Pull right arm DIAGONALLY (hip to shoulder) across chest. Bring right arm along head toward floor. Hold momentarily. Slowly return to starting position. Repeat _3x10__ times. Do with left arm. Band color _green_____ , once a day  Shoulder Rotation: Double Arm   On back, knees bent, feet flat, elbows tucked at sides, bent 90, hands palms up. Pull hands apart and down toward floor, keeping elbows near sides. Hold momentarily. Slowly return to starting position. Repeat _3x10__ times. Band color __green____ , once a day.

## 2016-01-04 NOTE — Therapy (Signed)
[place], [place], [address] Phone: [phone]   Fax:  [phone]  Physical Therapy Evaluation  Patient Details  Name: Sheila Harris MRN: [id number] Date of Birth: [birth year] Referring Provider: Dr Dianah Field  Encounter Date: 01/04/2016      PT End of Session - 01/04/16 1149    Visit Number 1   Number of Visits 8   Date for PT Re-Evaluation 02/15/16   PT Start Time 1149   PT Stop Time 1237   PT Time Calculation (min) 48 min   Activity Tolerance Patient tolerated treatment well      Past Medical History  Diagnosis Date  . Genital herpes   . Thyroid disease     History reviewed. No pertinent past surgical history.  There were no vitals filed for this visit.       Subjective Assessment - 01/04/16 1151    Subjective Pt followed up with MD on her finger, had an injection.  She mentioned her neck and showed him an old x-ray.  This showed severe cervical DDD. She has numbness into the Rt arm to the hand and tightness into the Lt chest and numb into the ulnar distribution of Lt hand.    Pertinent History costochondritis - still healing from this - lower ribs right now. severe osteoporosis on fortea. currently performing TRX exercise about 3 times a week   Diagnostic tests x-ray - severe cervical DDD   Patient Stated Goals MD wants her to try PT before having any injections in her neck.    Currently in Pain? No/denies            [facility] PT Assessment - 01/04/16 0001    Assessment   Medical Diagnosis Rt cervical radiculpathy   Referring Provider Dr Dianah Field   Onset Date/Surgical Date 07/07/15   Next MD Visit 01/24/16   Prior Therapy not for neck   Precautions   Precautions None   Balance Screen   Has the patient fallen in the past 6 months No   Has the patient had a decrease in activity level because of a fear of falling?  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   Vocation Requirements lift up to 50#, lots of bending   Leisure not sure   Observation/Other Assessments   Focus on Therapeutic Outcomes (FOTO)  41% limited   Posture/Postural Control   Posture/Postural Control Postural limitations   Postural Limitations Decreased thoracic kyphosis   ROM / Strength   AROM / PROM / Strength AROM;Strength   AROM   Overall AROM Comments UE's WNL   AROM Assessment Site Cervical   Cervical Flexion WNL   Cervical Extension WNL   Cervical - Right Rotation 58   Cervical - Left Rotation 40   Strength   Overall Strength Comments UE's WNL except Lt shoulder ER 4/5, triceps 4+/5   Flexibility   Soft Tissue Assessment /Muscle Length --  tight bilat pecs,    Palpation   Palpation comment tightness in bilat upper traps and levators                   [facility] Adult PT Treatment/Exercise - 01/04/16 0001    Exercises   Exercises Neck   Neck Exercises: Standing   Other Standing Exercises  standing wall push ups with bounce off/landing for bone growth/weight bearing exercise.    Neck Exercises: Supine   Cervical Isometrics 10 reps  head presses   Other Supine Exercise 10-15 reps each with green band, overhead pull, horizontal abd, ER with scap retrac, SASH - rec blue band at home                PT Education - 01/04/16 1249    Education provided Yes   Education Details HEP, reviewed current and new, osteoporosis precautions    Person(s) Educated Patient   Methods Explanation;Demonstration;Handout   Comprehension Returned demonstration;Verbalized understanding             PT Long Term Goals - 01/04/16 1258    PT LONG TERM GOAL #1   Title I with advanced HEP for cervical stabilization ( 02/15/16)    Time 6   Period Weeks   Status New   PT LONG TERM GOAL #2   Title  increase bilat cervical rotation =/> 60 degrees to assist with looking for traffic ( 02/15/16)    Time 6   Period Weeks   Status New   PT LONG TERM GOAL #3   Title Improve FOTO to </= 33% limitation (02/15/16)    Time 6   Period Weeks   Status New   PT LONG TERM GOAL #4   Title increase strength bilat shoulder ER 5/5 ( 02/15/16)    Time 6   Period Weeks   Status New               Plan - 01/04/16 1253    Clinical Impression Statement 60 yo female with severe cervical DDD and symptoms into her Rt UE.  She also has some symptoms into her Lt hand however she hit that elbow recently and may have just bruised her ulnar nerve. She also has severe osteoporosis.  [name] has a great understanding of the precautions that go along with this diagnosis.  She would benefit from instruction in cervical stabilization exercises.    Rehab Potential Good   PT Frequency 2x / week   PT Duration 6 weeks   PT Treatment/Interventions Ultrasound;Neuromuscular re-education;Patient/family education;Passive range of motion;Dry needling;Cryotherapy;Electrical Stimulation;Moist Heat;Therapeutic exercise;Manual techniques   PT Next Visit Plan Pt will be out of town until 01/21/16, will return then.  Cervical stabilization ex.    Consulted and Agree with Plan of Care Patient      Patient will benefit from skilled therapeutic intervention in order to improve the following deficits and impairments:  Postural dysfunction, Decreased strength, Impaired UE functional use, Increased muscle spasms, Decreased range of motion  Visit Diagnosis: Radiculopathy, cervical region - Plan: PT plan of care cert/re-cert  Muscle weakness (generalized) - Plan: PT plan of care cert/re-cert  Abnormal posture - Plan: PT plan of care cert/re-cert     Problem List Patient Active Problem List   Diagnosis Date Noted  . Primary osteoarthritis of right hand 12/30/2015  . Right cervical radiculopathy 12/30/2015  . Trigger finger of  left hand 10/08/2015  . Right lumbar radiculopathy 11/21/2013    Jeral Pinch PT  01/04/2016, 1:04 PM  [facility] [place], [place], [address] Phone: [phone]   Fax:  [phone]  Name: Sheila Harris MRN: [id number] Date of Birth: 03/29/1956

## 2016-01-21 ENCOUNTER — Encounter: Payer: Self-pay | Admitting: Rehabilitative and Restorative Service Providers"

## 2016-01-27 ENCOUNTER — Ambulatory Visit (INDEPENDENT_AMBULATORY_CARE_PROVIDER_SITE_OTHER): Payer: BC Managed Care – PPO | Admitting: Physical Therapy

## 2016-01-27 ENCOUNTER — Ambulatory Visit: Payer: BC Managed Care – PPO | Admitting: Sports Medicine

## 2016-01-27 DIAGNOSIS — M5412 Radiculopathy, cervical region: Secondary | ICD-10-CM

## 2016-01-27 DIAGNOSIS — R293 Abnormal posture: Secondary | ICD-10-CM

## 2016-01-27 DIAGNOSIS — M6281 Muscle weakness (generalized): Secondary | ICD-10-CM

## 2016-01-27 DIAGNOSIS — M2560 Stiffness of unspecified joint, not elsewhere classified: Secondary | ICD-10-CM

## 2016-01-27 DIAGNOSIS — Z7409 Other reduced mobility: Secondary | ICD-10-CM

## 2016-01-27 DIAGNOSIS — M623 Immobility syndrome (paraplegic): Secondary | ICD-10-CM | POA: Diagnosis not present

## 2016-01-27 NOTE — Therapy (Signed)
[place], [place], [address] Phone: [phone]   Fax:  [phone]  Physical Therapy Treatment  Patient Details  Name: Sheila Harris MRN: [id number] Date of Birth: 1955/07/24 Referring Provider: Dr Dianah Field  Encounter Date: 01/27/2016      PT End of Session - 01/27/16 0939    Visit Number 2   Number of Visits 8   Date for PT Re-Evaluation 02/15/16   PT Start Time 0936   PT Stop Time 1031   PT Time Calculation (min) 55 min      Past Medical History:  Diagnosis Date  . Genital herpes   . Thyroid disease     No past surgical history on file.  There were no vitals filed for this visit.      Subjective Assessment - 01/27/16 0935    Subjective Neck is not really hurting, thinks the new cervical pillow helps a lot with that. Concerned about the Rt hand and forearm stiffness.    Currently in Pain? No/denies  numbness and stiffness in Rt forearm and hand            [facility] PT Assessment - 01/27/16 0001      Assessment   Medical Diagnosis Rt cervical radiculpathy   Referring Provider Dr Dianah Field   Onset Date/Surgical Date 07/07/15   Next MD Visit rescheduling      AROM   AROM Assessment Site Cervical   Cervical - Right Rotation 69   Cervical - Left Rotation 10                     [facility] Adult PT Treatment/Exercise - 01/27/16 0001      Exercises   Exercises Wrist     Neck Exercises: Machines for Strengthening   UBE (Upper Arm Bike) L2x4' alt FWD/BWD     Wrist Exercises   Wrist Flexion Self ROM;Right;Standing  2x20 sec stretch   Wrist Extension Both;Self ROM;Standing  weightbearing through UE's 2x20sec   Other wrist exercises velcro roller board turn key and long handle roller.      Modalities   Modalities Electrical Stimulation;Moist Heat     Moist Heat Therapy   Number Minutes Moist Heat 15 Minutes   Moist Heat Location --  Rt forearm and hand     Electrical Stimulation   Electrical Stimulation Location rt forearm and hand   Electrical Stimulation Action IFC   Electrical Stimulation Parameters to tolerance   Electrical Stimulation Goals Pain;Tone     Manual Therapy   Manual Therapy Soft tissue mobilization   Soft tissue mobilization Rt forearm and hand           Trigger Point Dry Needling - 01/27/16 0953    Consent Given? Yes   Education Handout Provided No   Muscles Treated Upper Body --  Rt wrist extensors and hadn interosseos              PT Education - 01/27/16 0958    Education provided Yes   Education Details cervical stretch   Person(s) Educated Patient   Methods Explanation;Demonstration;Handout   Comprehension Returned demonstration;Verbalized understanding             PT Long Term Goals - 01/27/16 0938      PT LONG TERM GOAL #1   Title I with advanced HEP for cervical stabilization ( 02/15/16)    Time 6   Period Weeks   Status On-going  PT LONG TERM GOAL #2   Title increase bilat cervical rotation =/> 60 degrees to assist with looking for traffic ( 02/15/16)    Status Partially Met     PT LONG TERM GOAL #3   Title Improve FOTO to </= 33% limitation (02/15/16)    Time 6   Period Weeks   Status On-going     PT LONG TERM GOAL #4   Title increase strength bilat shoulder ER 5/5 ( 02/15/16)    Time 6   Period Weeks   Status On-going               Plan - 01/27/16 1021    Clinical Impression Statement this is [name]'s second visit, she has been out of town.  Her neck symptoms have calmed down and she has improved cervical ROM. She continues to have symptoms in her Rt forearm.  Responded well to TDN and manual work [employer] met a goal.  She may have to go out of town next week and may miss another week of PT    Rehab Potential Good   PT Frequency 2x / week   PT Duration 6 weeks   PT Treatment/Interventions Ultrasound;Neuromuscular re-education;Patient/family education;Passive  range of motion;Dry needling;Cryotherapy;Electrical Stimulation;Moist Heat;Therapeutic exercise;Manual techniques   PT Next Visit Plan assess response to TDN, cervical stab and forearm work      Patient will benefit from skilled therapeutic intervention in order to improve the following deficits and impairments:  Postural dysfunction, Decreased strength, Impaired UE functional use, Increased muscle spasms, Decreased range of motion  Visit Diagnosis: Radiculopathy, cervical region  Muscle weakness (generalized)  Abnormal posture  Stiffness due to immobility     Problem List Patient Active Problem List   Diagnosis Date Noted  . Primary osteoarthritis of right hand 12/30/2015  . Right cervical radiculopathy 12/30/2015  . Trigger finger of left hand 10/08/2015  . Right lumbar radiculopathy 11/21/2013    Sheila Harris PT 01/27/2016, 11:10 AM  [facility] [place], [place], [address] Phone: [phone]   Fax:  [phone]  Name: Sheila Harris MRN: [id number] Date of Birth: 28-Nov-1955

## 2016-01-27 NOTE — Patient Instructions (Addendum)
     Flexibility: Upper Trapezius Stretch    Gently grasp right side of head while reaching behind back with other hand. Tilt head away until a gentle stretch is felt. Hold _20-30___ seconds. Repeat __1-2 __ times per set. Do ___1_ sets per session. Do _1-2___ sessions per day.  http://orth.exer.us/340   Copyright  VHI. All rights reserved.

## 2016-02-04 ENCOUNTER — Encounter: Payer: BC Managed Care – PPO | Admitting: Physical Therapy

## 2016-02-10 ENCOUNTER — Encounter: Payer: Self-pay | Admitting: Physical Therapy

## 2016-02-10 ENCOUNTER — Ambulatory Visit (INDEPENDENT_AMBULATORY_CARE_PROVIDER_SITE_OTHER): Payer: BC Managed Care – PPO | Admitting: Physical Therapy

## 2016-02-10 ENCOUNTER — Encounter (INDEPENDENT_AMBULATORY_CARE_PROVIDER_SITE_OTHER): Payer: Self-pay

## 2016-02-10 DIAGNOSIS — R6889 Other general symptoms and signs: Secondary | ICD-10-CM

## 2016-02-10 DIAGNOSIS — M6281 Muscle weakness (generalized): Secondary | ICD-10-CM

## 2016-02-10 DIAGNOSIS — R531 Weakness: Secondary | ICD-10-CM

## 2016-02-10 DIAGNOSIS — M623 Immobility syndrome (paraplegic): Secondary | ICD-10-CM | POA: Diagnosis not present

## 2016-02-10 DIAGNOSIS — Z7409 Other reduced mobility: Secondary | ICD-10-CM

## 2016-02-10 DIAGNOSIS — R293 Abnormal posture: Secondary | ICD-10-CM

## 2016-02-10 DIAGNOSIS — M2560 Stiffness of unspecified joint, not elsewhere classified: Secondary | ICD-10-CM

## 2016-02-10 DIAGNOSIS — M5412 Radiculopathy, cervical region: Secondary | ICD-10-CM

## 2016-02-10 NOTE — Therapy (Addendum)
[place], [place], [address] Phone: [phone]   Fax:  [phone]  Physical Therapy Treatment  Patient Details  Name: Sheila Harris MRN: [id number] Date of Birth: May 05, 1956 Referring Provider: Dr Dianah Field  Encounter Date: 02/10/2016      PT End of Session - 02/10/16 0858    Visit Number 3   Number of Visits 8   Date for PT Re-Evaluation 02/15/16   PT Start Time 0853   PT Stop Time 0935   PT Time Calculation (min) 42 min   Activity Tolerance Patient tolerated treatment well      Past Medical History:  Diagnosis Date  . Genital herpes   . Thyroid disease     History reviewed. No pertinent surgical history.  There were no vitals filed for this visit.      Subjective Assessment - 02/10/16 0854    Subjective Pt has been swimming alot, laps and exercise. This helps her neck some, still has the nerve pain. Forearm symptoms are on/off, still has the hand stiffness, ulnar nerve distribution. Doesn't think the needling lasts long.  She has access to a home traction unit.  ALso wishes to learn some exercises that don't invovle gripping as this has bothered her hand some.    Patient Stated Goals MD wants her to try PT before having any injections in her neck.    Currently in Pain? No/denies  this morning there was some in her hand            [facility] PT Assessment - 02/10/16 0001      Assessment   Medical Diagnosis Rt cervical radiculpathy     AROM   Cervical - Right Rotation 55   Cervical - Left Rotation 55     Strength   Overall Strength Comments bilat shoulder ER and triceps 5/5                     [facility] Adult PT Treatment/Exercise - 02/10/16 0001      Neck Exercises: Machines for Strengthening   UBE (Upper Arm Bike) L2x4' alt FWD/BWD     Neck Exercises: Prone   Other Prone Exercise 30 reps corner of bed, T's , W's, Y's     Modalities   Modalities Traction      Traction   Type of Traction Cervical   Min (lbs) 5   Max (lbs) 14   Hold Time 60   Rest Time 20   Time 12                PT Education - 02/10/16 0906    Education provided Yes   Education Details HEP   Person(s) Educated Patient   Methods Explanation;Demonstration;Handout   Comprehension Returned demonstration;Verbalized understanding             PT Long Term Goals - 02/10/16 0859      PT LONG TERM GOAL #1   Title I with advanced HEP for cervical stabilization ( 02/15/16)    Status On-going     PT LONG TERM GOAL #2   Title increase bilat cervical rotation =/> 60 degrees to assist with looking for traffic ( 02/15/16)    Status On-going     PT LONG TERM GOAL #3   Title Improve FOTO to </= 33% limitation (02/15/16)    Status On-going     PT LONG TERM GOAL #4   Title increase strength bilat shoulder ER 5/5 (  02/15/16)    Status Achieved               Plan - 02/10/16 0907    Clinical Impression Statement Barrett has had some improvement with PT however not significant.  She has been out of town and missed the last two weeks. We are trying a low pull on traction to see if we can get pressure off the nerve in her neck and we modified her HEP so she doesn't have to grip a lot. She has met her strength goal.    Rehab Potential Good   PT Frequency 2x / week   PT Duration 6 weeks   PT Treatment/Interventions Ultrasound;Neuromuscular re-education;Patient/family education;Passive range of motion;Dry needling;Cryotherapy;Electrical Stimulation;Moist Heat;Therapeutic exercise;Manual techniques   PT Next Visit Plan assess response to cervical traction, patient going to schedule follow up with MD    Consulted and Agree with Plan of Care Patient      Patient will benefit from skilled therapeutic intervention in order to improve the following deficits and impairments:  Postural dysfunction, Decreased strength, Impaired UE functional use, Increased muscle spasms,  Decreased range of motion  Visit Diagnosis: Radiculopathy, cervical region  Muscle weakness (generalized)  Abnormal posture  Stiffness due to immobility  Decreased strength, endurance, and mobility     Problem List Patient Active Problem List   Diagnosis Date Noted  . Primary osteoarthritis of right hand 12/30/2015  . Right cervical radiculopathy 12/30/2015  . Trigger finger of left hand 10/08/2015  . Right lumbar radiculopathy 11/21/2013    Jeral Pinch PT  02/10/2016, 9:29 AM  [facility] [place], [place], [address] Phone: [phone]   Fax:  [phone]  Name: Sheila Harris MRN: [id number] Date of Birth: 12-31-1955  PHYSICAL THERAPY DISCHARGE SUMMARY  Visits from Start of Care: 3  Current functional level related to goals / functional outcomes: unknown   Remaining deficits: unknown   Education / Equipment: HEP Plan:                                                    Patient goals were partially met. Patient is being discharged due to not returning since the last visit.  ?????    Jeral Pinch, PT 05/15/16 2:17 PM

## 2016-02-10 NOTE — Patient Instructions (Addendum)
Can perform these on a ball or at the corner of a bed/stool.  Scapular Retraction: Abduction / Extension (Prone)    Lie with arms out from sides 90. Pinch shoulder blades together and raise arms a few inches from floor. Repeat __30__ times per set. Do _1___ sets per session. Do __1_ sessions per day. Scapular Retraction: Abduction (Prone)    Lie with upper arms straight out from sides, elbows bent to 90. Pinch shoulder blades together and raise arms a few inches from floor. Repeat __30__ times per set. Do __1__ sets per session. Do __1__ sessions per day. Scapular Retraction: Flexion (Prone)    Lie with arms forward. Pinch shoulder blades together and raise arms a few inches from floor. Repeat __30__ times per set. Do __1__ sets per session. Do ___1_ sessions per day. Scapular: Retraction in External Rotation    With hands clasped behind head, elbows up, pull elbows back, pinching shoulder blades together. Repeat __10__ times per set. Do __3__ sets per session. Do __1__ sessions per day. Copyright  VHI. All rights reserved.

## 2016-02-18 ENCOUNTER — Ambulatory Visit: Payer: BC Managed Care – PPO | Admitting: Sports Medicine

## 2016-02-22 ENCOUNTER — Ambulatory Visit (INDEPENDENT_AMBULATORY_CARE_PROVIDER_SITE_OTHER): Payer: BC Managed Care – PPO | Admitting: Sports Medicine

## 2016-02-22 DIAGNOSIS — M5412 Radiculopathy, cervical region: Secondary | ICD-10-CM

## 2016-02-22 DIAGNOSIS — G5601 Carpal tunnel syndrome, right upper limb: Secondary | ICD-10-CM | POA: Diagnosis not present

## 2016-02-22 DIAGNOSIS — M19041 Primary osteoarthritis, right hand: Secondary | ICD-10-CM | POA: Diagnosis not present

## 2016-02-22 NOTE — Progress Notes (Signed)
  Subjective:    CC: cervical radiculopathy and R 4th trigger finger   HPI: 60 yo with history of R cervical radiculopathy and trigger finger presenting for 6 week follow-up.  She has been going to PT for cervical pain, which has not helped relieve symptoms. She continues to complain of a stiff R neck and parasthesias and numbness that travels down her forearm and into her hand.  She says numbness in her hand is on the volar aspect of her palm.  She is also complaining of R fourth trigger finger, similar to prior.  She is not on Meloxicam because she says it hasn't helped in the past.  She is receiving "dry needling" treatments from her acupuncturist which she says provides some moderate temporary relief.  She understands proceeding to an epidural is a logical next step to treat her cervical radiculopathy, but does not think she can afford it at this time.   Past medical history, Surgical history, Family history not pertinant except as noted below, Social history, Allergies, and medications have been entered into the medical record, reviewed, and no changes needed.   Review of Systems: No fevers, chills, night sweats, weight loss, chest pain, or shortness of breath.   Objective:    General: Well Developed, well nourished, and in no acute distress.  Neuro: Alert and oriented x3, extra-ocular muscles intact, sensation grossly intact.  HEENT: Normocephalic, atraumatic, pupils equal round reactive to light, neck supple, no masses, no lymphadenopathy, thyroid nonpalpable.  Skin: Warm and dry, no rashes. Cardiac: Regular rate and rhythm, no murmurs rubs or gallops, no lower extremity edema.  Respiratory: Clear to auscultation bilaterally. Not using accessory muscles, speaking in full sentences. R Neck: Inspection unremarkable. No palpable stepoffs. Negative Spurling's maneuver. Full neck range of motion Grip strength and sensation normal in bilateral hands Strength good C4 to T1 distribution No  sensory change to C4 to T1 Negative Hoffman sign bilaterally Reflexes normal. R Wrist: Inspection normal with no visible erythema or swelling. ROM smooth and normal with good flexion and extension and ulnar/radial deviation that is symmetrical with opposite wrist. Palpation is normal over metacarpals, navicular, lunate, and TFCC.  No snuffbox tenderness. No tenderness over Canal of [place]. Strength 5/5 in all directions without pain. Positive Tinel's test.  Triggering of R fourth digit during flexion.   Procedure: Real-time Ultrasound Guided Injection of right median nerve hydrodissection Device: GE Logiq E  Verbal informed consent obtained.  Time-out conducted.  Noted no overlying erythema, induration, or other signs of local infection.  Skin prepped in a sterile fashion.  Local anesthesia: Topical Ethyl chloride.  With sterile technique and under real time ultrasound guidance:  Using a total of 1 mL kenalog 40, 5 mL lidocaine injected medication both superficial to and deep to the median nerve as well as a visible adjacent branch of the median nerve taking care to avoid intraneural injection, I also placed some medication deep within the carpal tunnel. Completed without difficulty  Pain immediately resolved suggesting accurate placement of the medication.  Advised to call if fevers/chills, erythema, induration, drainage, or persistent bleeding.  Images permanently stored and available for review in the ultrasound unit.  Impression: Technically successful ultrasound guided injection.  Impression and Recommendations:    1. R cervical radiculopathy  -MRI c-spine, will likely need epidural   2. Carpal tunnel: likely explanation of hand numbness/tingling -Carpal tunnel hydrodissection today  -Return in one month for follow-up

## 2016-02-22 NOTE — Assessment & Plan Note (Signed)
Knuckle pain has resolved after right third PIP injection at the last visit

## 2016-02-22 NOTE — Assessment & Plan Note (Signed)
Has failed conservative measures, median nerve hydrodissection as above, return in one month.

## 2016-02-22 NOTE — Assessment & Plan Note (Signed)
Persistent pain, she's already had an x-ray that showed C5 and C6 degenerative disc disease, proceeding with MRI for epidural planning, right cervical radiculopathy.

## 2016-02-23 ENCOUNTER — Encounter: Payer: Self-pay | Admitting: Radiology

## 2016-03-10 ENCOUNTER — Telehealth: Payer: Self-pay

## 2016-03-10 DIAGNOSIS — M159 Polyosteoarthritis, unspecified: Secondary | ICD-10-CM

## 2016-03-10 NOTE — Telephone Encounter (Signed)
Pt left VM stating she can't afford her MRI at this time but would like for you to put in a referral for a certified hand therapist. Pt stated multiple times she wants it to be someone who specializes in the hand for therapy. Please assist.

## 2016-03-10 NOTE — Telephone Encounter (Signed)
[facility] has occupational therapy, referral placed.

## 2016-03-21 ENCOUNTER — Ambulatory Visit: Payer: BC Managed Care – PPO | Admitting: Sports Medicine

## 2016-05-24 ENCOUNTER — Ambulatory Visit: Payer: BC Managed Care – PPO | Admitting: Family Medicine

## 2016-06-07 ENCOUNTER — Ambulatory Visit (INDEPENDENT_AMBULATORY_CARE_PROVIDER_SITE_OTHER): Payer: BC Managed Care – PPO | Admitting: Sports Medicine

## 2016-06-07 ENCOUNTER — Encounter: Payer: Self-pay | Admitting: Sports Medicine

## 2016-06-07 DIAGNOSIS — M6530 Trigger finger, unspecified finger: Secondary | ICD-10-CM | POA: Insufficient documentation

## 2016-06-07 DIAGNOSIS — M65331 Trigger finger, right middle finger: Secondary | ICD-10-CM

## 2016-06-07 NOTE — Assessment & Plan Note (Signed)
Right third flexor tendon sheath injection as above. Return in one month. If insufficient improve and we can consider a right third  MCP injection.

## 2016-06-07 NOTE — Progress Notes (Signed)
  Subjective:    CC: Follow-up  HPI: Right hand pain: Localized at the volar third flexor tendon sheath, desires interventional treatment today, only minimal triggering.  Right hand paresthesias: Did not respond to a median nerve hydrodissection. Has declined MRI in the past.  Low back pain: Agrees to discuss this at a future visit.  Past medical history:  Negative.  See flowsheet/record as well for more information.  Surgical history: Negative.  See flowsheet/record as well for more information.  Family history: Negative.  See flowsheet/record as well for more information.  Social history: Negative.  See flowsheet/record as well for more information.  Allergies, and medications have been entered into the medical record, reviewed, and no changes needed.   Review of Systems: No fevers, chills, night sweats, weight loss, chest pain, or shortness of breath.   Objective:    General: Well Developed, well nourished, and in no acute distress.  Neuro: Alert and oriented x3, extra-ocular muscles intact, sensation grossly intact.  HEENT: Normocephalic, atraumatic, pupils equal round reactive to light, neck supple, no masses, no lymphadenopathy, thyroid nonpalpable.  Skin: Warm and dry, no rashes. Cardiac: Regular rate and rhythm, no murmurs rubs or gallops, no lower extremity edema.  Respiratory: Clear to auscultation bilaterally. Not using accessory muscles, speaking in full sentences. Right hand: There is a palpable flexor tendon nodule with minimal tenderness at the third flexor tendon sheath near the A1 pulley.  Procedure: Real-time Ultrasound Guided Injection of right third flexor tendon sheath Device: GE Logiq E  Verbal informed consent obtained.  Time-out conducted.  Noted no overlying erythema, induration, or other signs of local infection.  Skin prepped in a sterile fashion.  Local anesthesia: Topical Ethyl chloride.  With sterile technique and under real time ultrasound guidance:   30-gauge needle advanced under the A1 pulling into the flexor tendon sheath and 1/2 mL kenalog 40, 1/2 mL lidocaine injected easily. Completed without difficulty  Pain immediately resolved suggesting accurate placement of the medication.  Advised to call if fevers/chills, erythema, induration, drainage, or persistent bleeding.  Images permanently stored and available for review in the ultrasound unit.  Impression: Technically successful ultrasound guided injection.  Impression and Recommendations:    Trigger finger of right hand Right third flexor tendon sheath injection as above. Return in one month. If insufficient improve and we can consider a right third  MCP injection.

## 2016-06-13 ENCOUNTER — Ambulatory Visit: Payer: BC Managed Care – PPO | Admitting: Family Medicine

## 2016-06-14 ENCOUNTER — Telehealth: Payer: Self-pay

## 2016-06-14 DIAGNOSIS — M6530 Trigger finger, unspecified finger: Secondary | ICD-10-CM

## 2016-06-14 MED ORDER — GABAPENTIN 300 MG PO CAPS: ORAL_CAPSULE | ORAL | 3 refills | Status: DC

## 2016-06-14 NOTE — Telephone Encounter (Signed)
Orders placed for gabapentin, also I would like her to touch base once with physical therapy to learn the hand exercises appropriately.

## 2016-06-14 NOTE — Telephone Encounter (Signed)
Pt left VM asking when she can do hand exercises? Pt also stated that she was told about gabapentin and if you were going to order it for her. Please advise.

## 2016-06-14 NOTE — Telephone Encounter (Signed)
Pt.notified

## 2016-06-29 ENCOUNTER — Ambulatory Visit: Payer: BC Managed Care – PPO | Admitting: Family Medicine

## 2016-07-05 ENCOUNTER — Ambulatory Visit: Payer: BC Managed Care – PPO | Admitting: Sports Medicine

## 2016-08-15 ENCOUNTER — Ambulatory Visit: Payer: BC Managed Care – PPO | Admitting: Family Medicine

## 2016-09-21 ENCOUNTER — Other Ambulatory Visit: Payer: Self-pay | Admitting: Physician Assistant

## 2016-09-21 DIAGNOSIS — Z1231 Encounter for screening mammogram for malignant neoplasm of breast: Secondary | ICD-10-CM

## 2016-09-29 ENCOUNTER — Ambulatory Visit (INDEPENDENT_AMBULATORY_CARE_PROVIDER_SITE_OTHER): Payer: BC Managed Care – PPO | Admitting: Sports Medicine

## 2016-09-29 DIAGNOSIS — M6530 Trigger finger, unspecified finger: Secondary | ICD-10-CM

## 2016-09-29 DIAGNOSIS — M7702 Medial epicondylitis, left elbow: Secondary | ICD-10-CM | POA: Insufficient documentation

## 2016-09-29 MED ORDER — PREDNISONE 50 MG PO TABS: ORAL_TABLET | ORAL | 0 refills | Status: DC

## 2016-09-29 NOTE — Progress Notes (Signed)
   Subjective:    I'm seeing this patient as a consultation for:  Dr. Orpah Melter  CC: Multiple issues  HPI: Right hand pain: With multiple episodes of stenosing tenosynovitis in the past that responded well to injections, now having pain and tightness in the second through fifth fingers.  Left elbow pain: Localized at the medial epicondyle, moderate, persistent with radiation down to the hand, worse with gripping.  Past medical history:  Negative.  See flowsheet/record as well for more information.  Surgical history: Negative.  See flowsheet/record as well for more information.  Family history: Negative.  See flowsheet/record as well for more information.  Social history: Negative.  See flowsheet/record as well for more information.  Allergies, and medications have been entered into the medical record, reviewed, and no changes needed.   Review of Systems: No headache, visual changes, nausea, vomiting, diarrhea, constipation, dizziness, abdominal pain, skin rash, fevers, chills, night sweats, weight loss, swollen lymph nodes, body aches, joint swelling, muscle aches, chest pain, shortness of breath, mood changes, visual or auditory hallucinations.   Objective:   General: Well Developed, well nourished, and in no acute distress.  Neuro/Psych: Alert and oriented x3, extra-ocular muscles intact, able to move all 4 extremities, sensation grossly intact. Skin: Warm and dry, no rashes noted.  Respiratory: Not using accessory muscles, speaking in full sentences, trachea midline.  Cardiovascular: Pulses palpable, no extremity edema. Abdomen: Does not appear distended. Left Elbow: Unremarkable to inspection. Range of motion full pronation, supination, flexion, extension. Strength is full to all of the above directions Stable to varus, valgus stress. Negative moving valgus stress test. Tender to palpation at the medial epicondyle Ulnar nerve does not sublux. Negative cubital tunnel  Tinel's.  Impression and Recommendations:   This case required medical decision making of moderate complexity.  Medial epicondylitis of elbow, left Rehabilitation exercises given. Injection if no better.  Trigger finger of right hand Stenosing tenosynovitis of the second through fifth flexor tendons, patient declines multiple injections so we are going to simply use oral prednisone for now. Return in one month, multiple injections if no better.

## 2016-09-29 NOTE — Assessment & Plan Note (Signed)
Stenosing tenosynovitis of the second through fifth flexor tendons, patient declines multiple injections so we are going to simply use oral prednisone for now. Return in one month, multiple injections if no better.

## 2016-09-29 NOTE — Assessment & Plan Note (Addendum)
Rehabilitation exercises given. Injection if no better.

## 2016-10-27 ENCOUNTER — Ambulatory Visit: Payer: BC Managed Care – PPO | Admitting: Sports Medicine

## 2016-11-02 ENCOUNTER — Ambulatory Visit
Admission: RE | Admit: 2016-11-02 | Discharge: 2016-11-02 | Disposition: A | Discharge status: Home / Self Care | Payer: BC Managed Care – PPO | Source: Ambulatory Visit | Attending: Physician Assistant | Admitting: Physician Assistant

## 2016-11-02 DIAGNOSIS — Z1231 Encounter for screening mammogram for malignant neoplasm of breast: Secondary | ICD-10-CM

## 2017-09-28 ENCOUNTER — Other Ambulatory Visit: Payer: Self-pay | Admitting: Family Medicine

## 2017-09-28 DIAGNOSIS — Z1231 Encounter for screening mammogram for malignant neoplasm of breast: Secondary | ICD-10-CM

## 2017-10-17 ENCOUNTER — Other Ambulatory Visit: Payer: Self-pay | Admitting: Family Medicine

## 2017-10-17 DIAGNOSIS — M810 Age-related osteoporosis without current pathological fracture: Secondary | ICD-10-CM

## 2017-11-06 ENCOUNTER — Ambulatory Visit
Admission: RE | Admit: 2017-11-06 | Discharge: 2017-11-06 | Disposition: A | Discharge status: Home / Self Care | Payer: BC Managed Care – PPO | Source: Ambulatory Visit | Attending: Family Medicine | Admitting: Family Medicine

## 2017-11-06 DIAGNOSIS — Z1231 Encounter for screening mammogram for malignant neoplasm of breast: Secondary | ICD-10-CM

## 2017-12-04 ENCOUNTER — Ambulatory Visit
Admission: RE | Admit: 2017-12-04 | Discharge: 2017-12-04 | Disposition: A | Discharge status: Home / Self Care | Payer: BC Managed Care – PPO | Source: Ambulatory Visit | Attending: Family Medicine | Admitting: Family Medicine

## 2017-12-04 ENCOUNTER — Other Ambulatory Visit: Payer: BC Managed Care – PPO

## 2017-12-04 DIAGNOSIS — M810 Age-related osteoporosis without current pathological fracture: Secondary | ICD-10-CM

## 2018-07-19 ENCOUNTER — Ambulatory Visit (INDEPENDENT_AMBULATORY_CARE_PROVIDER_SITE_OTHER): Payer: BC Managed Care – PPO | Admitting: Sports Medicine

## 2018-07-19 DIAGNOSIS — Q667 Congenital pes cavus, unspecified foot: Secondary | ICD-10-CM

## 2018-07-19 NOTE — Progress Notes (Signed)
    Patient was fitted for a : standard, cushioned, semi-rigid orthotic. The orthotic was heated and afterward the patient stood on the orthotic blank positioned on the orthotic stand. The patient was positioned in subtalar neutral position and 10 degrees of ankle dorsiflexion in a weight bearing stance. After completion of molding, a stable base was applied to the orthotic blank. The blank was ground to a stable position for weight bearing. Size: 10 Base: [place] Health and safety inspector and [place]adding: None The patient ambulated these, and they were very comfortable.  ___________________________________________ Gwen Her. Dianah Field, M.D., ABFM., CAQSM. Primary Care and [place] Instructor of [place] [facility]

## 2018-07-19 NOTE — Assessment & Plan Note (Signed)
New set of custom orthotics as above.

## 2018-09-26 ENCOUNTER — Other Ambulatory Visit: Payer: Self-pay | Admitting: Family Medicine

## 2018-09-26 DIAGNOSIS — Z1231 Encounter for screening mammogram for malignant neoplasm of breast: Secondary | ICD-10-CM

## 2018-11-29 ENCOUNTER — Ambulatory Visit: Payer: BC Managed Care – PPO

## 2019-01-10 ENCOUNTER — Other Ambulatory Visit: Payer: Self-pay

## 2019-01-10 ENCOUNTER — Ambulatory Visit
Admission: RE | Admit: 2019-01-10 | Discharge: 2019-01-10 | Disposition: A | Discharge status: Home / Self Care | Payer: BC Managed Care – PPO | Source: Ambulatory Visit | Attending: Family Medicine | Admitting: Family Medicine

## 2019-01-10 DIAGNOSIS — Z1231 Encounter for screening mammogram for malignant neoplasm of breast: Secondary | ICD-10-CM

## 2019-01-10 LAB — HM MAMMOGRAPHY

## 2019-06-27 ENCOUNTER — Ambulatory Visit: Payer: BC Managed Care – PPO | Attending: Internal Medicine

## 2019-06-27 DIAGNOSIS — Z20822 Contact with and (suspected) exposure to covid-19: Secondary | ICD-10-CM

## 2019-06-28 LAB — NOVEL CORONAVIRUS, NAA: SARS-CoV-2, NAA: NOT DETECTED

## 2019-07-02 LAB — COMPREHENSIVE METABOLIC PANEL: Calcium: 9.4 (ref 8.7–10.7)

## 2019-07-02 LAB — LIPID PANEL
Cholesterol: 181 (ref 0–200)
HDL: 63 (ref 35–70)
LDL Cholesterol: 106
Triglycerides: 65 (ref 40–160)

## 2019-07-02 LAB — BASIC METABOLIC PANEL
BUN: 15 (ref 4–21)
CO2: 31 — AB (ref 13–22)
Chloride: 105 (ref 99–108)
Creatinine: 0.8 (ref 0.5–1.1)
Glucose: 94
Potassium: 4.4 (ref 3.4–5.3)
Sodium: 142 (ref 137–147)

## 2019-07-02 LAB — VITAMIN B12: Vitamin B-12: 412

## 2019-07-02 LAB — VITAMIN D 25 HYDROXY (VIT D DEFICIENCY, FRACTURES): Vit D, 25-Hydroxy: 92.7

## 2019-07-14 ENCOUNTER — Other Ambulatory Visit: Payer: Self-pay | Admitting: Family Medicine

## 2019-07-14 DIAGNOSIS — M810 Age-related osteoporosis without current pathological fracture: Secondary | ICD-10-CM

## 2019-09-15 ENCOUNTER — Encounter: Payer: BC Managed Care – PPO | Admitting: Family Medicine

## 2019-10-29 LAB — HEMOGLOBIN A1C: Hemoglobin A1C: 5.3

## 2019-10-29 LAB — THYROID PANEL WITH TSH
Free T3: 3.65
Free T4: 1.11
Reverse T3, Serum: 13.5
TSH: 1.46
Thyroglobulin Antibody: 109.66
Thyroid Peroxidase (TPO) Ab: 16.04

## 2019-10-29 LAB — TESTOSTERONE: Testosterone: 21.62

## 2019-10-29 LAB — PROGESTERONE: Progesterone: 0.54

## 2019-10-29 LAB — ESTRADIOL: Estradiol: 37.7

## 2019-10-29 LAB — FOLLICLE STIMULATING HORMONE: FSH: 84.8

## 2019-11-19 ENCOUNTER — Ambulatory Visit (INDEPENDENT_AMBULATORY_CARE_PROVIDER_SITE_OTHER): Payer: PPO | Admitting: Family Medicine

## 2019-11-19 ENCOUNTER — Encounter: Payer: Self-pay | Admitting: Family Medicine

## 2019-11-19 VITALS — BP 118/66 | HR 77 | Ht 66.0 in | Wt 129.0 lb

## 2019-11-19 DIAGNOSIS — M545 Low back pain, unspecified: Secondary | ICD-10-CM | POA: Insufficient documentation

## 2019-11-19 DIAGNOSIS — B009 Herpesviral infection, unspecified: Secondary | ICD-10-CM

## 2019-11-19 DIAGNOSIS — D6851 Activated protein C resistance: Secondary | ICD-10-CM | POA: Diagnosis not present

## 2019-11-19 DIAGNOSIS — M818 Other osteoporosis without current pathological fracture: Secondary | ICD-10-CM

## 2019-11-19 DIAGNOSIS — E039 Hypothyroidism, unspecified: Secondary | ICD-10-CM | POA: Insufficient documentation

## 2019-11-19 DIAGNOSIS — F419 Anxiety disorder, unspecified: Secondary | ICD-10-CM | POA: Diagnosis not present

## 2019-11-19 DIAGNOSIS — M810 Age-related osteoporosis without current pathological fracture: Secondary | ICD-10-CM | POA: Diagnosis not present

## 2019-11-19 MED ORDER — LEVOTHYROXINE SODIUM 75 MCG PO TABS: 75.0000 ug | ORAL_TABLET | Freq: Every day | ORAL | 1 refills | Status: DC

## 2019-11-19 NOTE — Assessment & Plan Note (Addendum)
On Forteo x2 years.  Currently on Fosamax and participates in weightbearing exercise.  More recently trying topical estrogen therapy to help with her bone density through integrative specialist and [facility]

## 2019-11-19 NOTE — Assessment & Plan Note (Signed)
She is on daily suppressive therapy with Valtrex.

## 2019-11-19 NOTE — Assessment & Plan Note (Signed)
Continue with monthly therapy visits.  She uses clonazepam as needed.  Often taking half to 1 during the daytime and 1 at night.

## 2019-11-19 NOTE — Progress Notes (Signed)
Established Patient Office Visit  Subjective:  Patient ID: Sheila Harris, female    DOB: 1955/12/05  Age: 64 y.o. MRN: [id number]  CC:  Chief Complaint  Patient presents with  . Establish Care    HPI Sheila Harris presents to establish care here her husband and son have been seeing me for years.  She was recently seen by Dr. Sammuel Cooper.  She also had been getting treatment at integrative therapy in [facility] for her thyroid and hormones.  Hypothyroidism-she says she was recently increased from 50 mcg back to 75 mcg which was what she used to take.  She been feeling a little bit more fatigued she says since the switch she has been feeling better.  She also uses Cytomel.  She would like a 90-day prescription sent to pharmacy.  She says she was diagnosed with factor V Leiden years ago.  Also diagnosed with osteoporosis she has a T score of -3.5.  She said she was on Forteo for about 2 years and had some benefit and then was switched to Fosamax she does participate in weightbearing exercise.  She is due for her repeat 2-year DEXA scan in July.  She also has a history of colon polyps and gets a colonoscopy every 3 years she follows with Dr. Arville Go off.  She has been seeing a therapist virtually for about 4 years her name is Caitlin Hecox.  She has been seeing her monthly more recently.  Her mammogram is scheduled for July.  Past Medical History:  Diagnosis Date  . Genital herpes   . Thyroid disease     No past surgical history on file.  Family History  Problem Relation Age of Onset  . Heart disease Mother   . Hypertension Mother   . Stroke Mother   . Breast cancer Sister     Social History   Socioeconomic History  . Marital status: Married    Spouse name: Not on file  . Number of children: Not on file  . Years of education: Not on file  . Highest education level: Not on file  Occupational History  . Not on file  Tobacco Use  . Smoking status: Never Smoker   . Smokeless tobacco: Never Used  Substance and Sexual Activity  . Alcohol use: Yes    Alcohol/week: 5.0 standard drinks    Types: 5 Standard drinks or equivalent per week    Comment: weekly  . Drug use: Never  . Sexual activity: Not Currently    Partners: Male  Other Topics Concern  . Not on file  Social History Narrative  . Not on file   Social Determinants of Health   Financial Resource Strain:   . Difficulty of Paying Living Expenses:   Food Insecurity:   . Worried About Charity fundraiser in the Last Year:   . Arboriculturist in the Last Year:   Transportation Needs:   . Film/video editor (Medical):   Marland Kitchen Lack of Transportation (Non-Medical):   Physical Activity:   . Days of Exercise per Week:   . Minutes of Exercise per Session:   Stress:   . Feeling of Stress :   Social Connections:   . Frequency of Communication with Friends and Family:   . Frequency of Social Gatherings with Friends and Family:   . Attends Religious Services:   . Active Member of Clubs or Organizations:   . Attends Archivist Meetings:   .  Marital Status:   Intimate Partner Violence:   . Fear of Current or Ex-Partner:   . Emotionally Abused:   Marland Kitchen Physically Abused:   . Sexually Abused:     Outpatient Medications Prior to Visit  Medication Sig Dispense Refill  . alendronate (FOSAMAX) 70 MG tablet Take 70 mg by mouth once a week. Take with a full glass of water on an empty stomach.    Marland Kitchen ascorbic acid (VITAMIN C) 100 MG tablet Take by mouth.    Marland Kitchen b complex vitamins tablet Take by mouth.    . clonazePAM (KLONOPIN) 0.5 MG tablet Take 0.25 mg by mouth at bedtime as needed.    Marland Kitchen escitalopram (LEXAPRO) 10 MG tablet Take 15 mg by mouth daily.    Marland Kitchen estradiol (CLIMARA - DOSED IN MG/24 HR) 0.025 mg/24hr patch Place 0.025 mg onto the skin once a week.    Marland Kitchen liothyronine (CYTOMEL) 25 MCG tablet Take 12.5 mcg by mouth daily.    . Multiple Vitamin (THERA) TABS Take by mouth.    .  progesterone 200 MG SUPP Place 200 mg vaginally at bedtime.    . valACYclovir (VALTREX) 500 MG tablet Take by mouth.    . levothyroxine (SYNTHROID, LEVOTHROID) 50 MCG tablet Take by mouth.    . ALPRAZolam (XANAX) 0.25 MG tablet TAKE ONE OR TWO TABLETS BY MOUTH TWICE A DAY AS NEEDED FOR 30 DAYS  1  . gabapentin (NEURONTIN) 300 MG capsule One tab PO qHS for a week, then BID for a week, then TID. May double weekly to a max of 3,670m/day 90 capsule 3  . ibuprofen (ADVIL,MOTRIN) 200 MG tablet Take by mouth.    . predniSONE (DELTASONE) 50 MG tablet One tab PO daily for 5 days. 5 tablet 0  . Teriparatide, Recombinant, 600 MCG/2.4ML SOLN Inject into the skin.     No facility-administered medications prior to visit.    No Known Allergies  ROS Review of Systems  Constitutional: Negative for diaphoresis, fever and unexpected weight change.  HENT: Negative for hearing loss, rhinorrhea and tinnitus.   Eyes: Negative for visual disturbance.  Respiratory: Negative for cough and wheezing.   Cardiovascular: Negative for chest pain and palpitations.  Gastrointestinal: Negative for blood in stool, diarrhea, nausea and vomiting.  Genitourinary: Negative for difficulty urinating, vaginal bleeding and vaginal discharge.  Musculoskeletal: Negative for arthralgias and myalgias.  Skin: Negative for rash.       Concerning mole for which she has been followed by dermatology.  Neurological: Negative for headaches.  Hematological: Negative for adenopathy. Does not bruise/bleed easily.  Psychiatric/Behavioral: Negative for dysphoric mood and sleep disturbance. The patient is nervous/anxious.       Objective:    Physical Exam  Constitutional: She is oriented to person, place, and time. She appears well-developed and well-nourished.  HENT:  Head: Normocephalic and atraumatic.  Cardiovascular: Normal rate, regular rhythm and normal heart sounds.  Pulmonary/Chest: Effort normal and breath sounds normal.   Neurological: She is alert and oriented to person, place, and time.  Skin: Skin is warm and dry.  Psychiatric: She has a normal mood and affect. Her behavior is normal.    BP 118/66   Pulse 77   Ht 5' 6"  (1.676 m)   Wt 129 lb (58.5 kg)   LMP 04/03/2010   SpO2 100%   BMI 20.82 kg/m  Wt Readings from Last 3 Encounters:  11/19/19 129 lb (58.5 kg)  09/29/16 130 lb (59 kg)  06/07/16 135 lb 9.6  oz (61.5 kg)     Health Maintenance Due  Topic Date Due  . PAP SMEAR-Modifier  11/19/2019    There are no preventive care reminders to display for this patient.  No results found for: TSH No results found for: WBC, HGB, HCT, MCV, PLT No results found for: NA, K, CHLORIDE, CO2, GLUCOSE, BUN, CREATININE, BILITOT, ALKPHOS, AST, ALT, PROT, ALBUMIN, CALCIUM, ANIONGAP, EGFR, GFR No results found for: CHOL No results found for: HDL No results found for: LDLCALC No results found for: TRIG No results found for: CHOLHDL No results found for: HGBA1C    Assessment & Plan:   Problem List Items Addressed This Visit      Endocrine   Hypothyroidism    But had not refilled her levothyroxine today for 90-day supplies she also takes levothyroid on.      Relevant Medications   liothyronine (CYTOMEL) 25 MCG tablet   levothyroxine (SYNTHROID) 75 MCG tablet     Musculoskeletal and Integument   Osteoporosis    On Forteo x2 years.  Currently on Fosamax and participates in weightbearing exercise.  More recently trying topical estrogen therapy to help with her bone density through integrative specialist and [place]      Relevant Medications   alendronate (FOSAMAX) 70 MG tablet     Hematopoietic and Hemostatic   Factor V Leiden (HCC)     Other   Recurrent low back pain - Primary    Uses ibuprofen 800 mg as needed.      Herpes infection    She is on daily suppressive therapy with Valtrex.      Anxiety    Continue with monthly therapy visits.  She uses clonazepam as needed.  Often  taking half to 1 during the daytime and 1 at night.      Relevant Medications   escitalopram (LEXAPRO) 10 MG tablet     She does see dermatology for her skin checks.  She recently had a biopsy done on her right lower leg and they have asked her to return for further treatment.  Meds ordered this encounter  Medications  . levothyroxine (SYNTHROID) 75 MCG tablet    Sig: Take 1 tablet (75 mcg total) by mouth daily.    Dispense:  90 tablet    Refill:  1    Follow-up: Return in about 6 months (around 05/20/2020) for recheck thyroid.    [name], MD

## 2019-11-19 NOTE — Assessment & Plan Note (Signed)
But had not refilled her levothyroxine today for 90-day supplies she also takes levothyroid on.

## 2019-11-19 NOTE — Assessment & Plan Note (Signed)
Uses ibuprofen 800 mg as needed.

## 2019-11-24 ENCOUNTER — Encounter: Payer: Self-pay | Admitting: Family Medicine

## 2019-11-24 DIAGNOSIS — Z1211 Encounter for screening for malignant neoplasm of colon: Secondary | ICD-10-CM | POA: Diagnosis not present

## 2019-11-24 DIAGNOSIS — Z86010 Personal history of colonic polyps: Secondary | ICD-10-CM | POA: Diagnosis not present

## 2019-11-24 LAB — HM COLONOSCOPY

## 2019-11-25 ENCOUNTER — Encounter: Payer: Self-pay | Admitting: Family Medicine

## 2019-11-26 DIAGNOSIS — B070 Plantar wart: Secondary | ICD-10-CM | POA: Diagnosis not present

## 2019-11-28 ENCOUNTER — Other Ambulatory Visit: Payer: Self-pay | Admitting: *Deleted

## 2019-11-28 MED ORDER — LIOTHYRONINE SODIUM 25 MCG PO TABS: 12.5000 ug | ORAL_TABLET | Freq: Every day | ORAL | 1 refills | Status: DC

## 2019-12-08 ENCOUNTER — Other Ambulatory Visit: Payer: Self-pay

## 2019-12-08 ENCOUNTER — Ambulatory Visit
Admission: RE | Admit: 2019-12-08 | Discharge: 2019-12-08 | Disposition: A | Discharge status: Home / Self Care | Payer: BC Managed Care – PPO | Source: Ambulatory Visit | Attending: Family Medicine | Admitting: Family Medicine

## 2019-12-08 DIAGNOSIS — M810 Age-related osteoporosis without current pathological fracture: Secondary | ICD-10-CM

## 2019-12-08 DIAGNOSIS — Z780 Asymptomatic menopausal state: Secondary | ICD-10-CM | POA: Diagnosis not present

## 2019-12-09 ENCOUNTER — Other Ambulatory Visit: Payer: Self-pay | Admitting: Family Medicine

## 2019-12-09 ENCOUNTER — Encounter: Payer: Self-pay | Admitting: Family Medicine

## 2019-12-09 DIAGNOSIS — Z1231 Encounter for screening mammogram for malignant neoplasm of breast: Secondary | ICD-10-CM

## 2019-12-10 MED ORDER — CLONAZEPAM 0.5 MG PO TABS: 0.2500 mg | ORAL_TABLET | Freq: Every evening | ORAL | 1 refills | Status: DC | PRN

## 2019-12-10 NOTE — Telephone Encounter (Signed)
Med sent.

## 2019-12-11 ENCOUNTER — Encounter: Payer: Self-pay | Admitting: Family Medicine

## 2019-12-11 DIAGNOSIS — B079 Viral wart, unspecified: Secondary | ICD-10-CM | POA: Diagnosis not present

## 2019-12-11 DIAGNOSIS — L918 Other hypertrophic disorders of the skin: Secondary | ICD-10-CM | POA: Diagnosis not present

## 2019-12-11 DIAGNOSIS — D485 Neoplasm of uncertain behavior of skin: Secondary | ICD-10-CM | POA: Diagnosis not present

## 2019-12-12 DIAGNOSIS — M5032 Other cervical disc degeneration, mid-cervical region, unspecified level: Secondary | ICD-10-CM | POA: Diagnosis not present

## 2019-12-12 DIAGNOSIS — M9904 Segmental and somatic dysfunction of sacral region: Secondary | ICD-10-CM | POA: Diagnosis not present

## 2019-12-12 DIAGNOSIS — M5137 Other intervertebral disc degeneration, lumbosacral region: Secondary | ICD-10-CM | POA: Diagnosis not present

## 2019-12-12 DIAGNOSIS — M9903 Segmental and somatic dysfunction of lumbar region: Secondary | ICD-10-CM | POA: Diagnosis not present

## 2019-12-12 DIAGNOSIS — M5136 Other intervertebral disc degeneration, lumbar region: Secondary | ICD-10-CM | POA: Diagnosis not present

## 2019-12-12 DIAGNOSIS — M9901 Segmental and somatic dysfunction of cervical region: Secondary | ICD-10-CM | POA: Diagnosis not present

## 2019-12-13 DIAGNOSIS — M25572 Pain in left ankle and joints of left foot: Secondary | ICD-10-CM | POA: Diagnosis not present

## 2019-12-16 DIAGNOSIS — M84375A Stress fracture, left foot, initial encounter for fracture: Secondary | ICD-10-CM | POA: Diagnosis not present

## 2019-12-23 DIAGNOSIS — M84375D Stress fracture, left foot, subsequent encounter for fracture with routine healing: Secondary | ICD-10-CM | POA: Diagnosis not present

## 2019-12-24 ENCOUNTER — Ambulatory Visit: Payer: PPO | Admitting: Sports Medicine

## 2019-12-30 DIAGNOSIS — M791 Myalgia, unspecified site: Secondary | ICD-10-CM | POA: Diagnosis not present

## 2019-12-30 DIAGNOSIS — M9902 Segmental and somatic dysfunction of thoracic region: Secondary | ICD-10-CM | POA: Diagnosis not present

## 2019-12-30 DIAGNOSIS — M9905 Segmental and somatic dysfunction of pelvic region: Secondary | ICD-10-CM | POA: Diagnosis not present

## 2019-12-30 DIAGNOSIS — M9901 Segmental and somatic dysfunction of cervical region: Secondary | ICD-10-CM | POA: Diagnosis not present

## 2019-12-30 DIAGNOSIS — M9903 Segmental and somatic dysfunction of lumbar region: Secondary | ICD-10-CM | POA: Diagnosis not present

## 2020-01-01 ENCOUNTER — Encounter: Payer: Self-pay | Admitting: Family Medicine

## 2020-01-01 ENCOUNTER — Ambulatory Visit (INDEPENDENT_AMBULATORY_CARE_PROVIDER_SITE_OTHER): Payer: PPO | Admitting: Family Medicine

## 2020-01-01 ENCOUNTER — Other Ambulatory Visit: Payer: Self-pay

## 2020-01-01 VITALS — BP 110/64 | HR 78 | Ht 66.0 in | Wt 129.0 lb

## 2020-01-01 DIAGNOSIS — M810 Age-related osteoporosis without current pathological fracture: Secondary | ICD-10-CM | POA: Diagnosis not present

## 2020-01-01 DIAGNOSIS — M84375A Stress fracture, left foot, initial encounter for fracture: Secondary | ICD-10-CM

## 2020-01-01 NOTE — Assessment & Plan Note (Signed)
At this point because she has had a significant drop in her T score to -4.1 I think we should consider alternative treatments she is already done a treatment of Forteo x2 years that as far as I am aware she has not able to retry that during her lifetime.  She might be a candidate for one of the newer drugs called Evenity.  I would like to refer her to endocrine for further consultation continue with supplementation of calcium and vitamin D and weightbearing exercise which she has been faithful with.  Did discuss that after treatment with Evenity she would absolutely have to be very consistent with a bisphosphonate to help maintain her bone density afterwards.  We also discussed the possibility of Prolia as an option as well.  She was just a little bit fearful of doing an injection that last 6 months in case she had side effects.

## 2020-01-01 NOTE — Telephone Encounter (Signed)
We can always refer her to endocrinology for consult for osteoporosis if she would like.  They can maybe help her decide what would be the best option.  I still think Prolia could be helpful.

## 2020-01-01 NOTE — Progress Notes (Signed)
She reports that she hasn't been consistent with taking the Fosamax weekly and would like to discuss if she has alternative options.

## 2020-01-01 NOTE — Progress Notes (Signed)
Established Patient Office Visit  Subjective:  Patient ID: Sheila Harris, female    DOB: Mar 26, 1956  Age: 64 y.o. MRN: [id number]  CC:  Chief Complaint  Patient presents with  . Results    bone density    HPI Sheila Harris presents for follow-up of severe osteoporosis.  She says she was diagnosed with osteopenia at the age of 48 after initially having some back pain and problems.  Since then she has had a significant drop in her T score.  Back in 2018 it was -3.5 and she did 2 years of treatment with Forteo.  She saw significant improvement in her T score to -2.5.  She has been completed with that treatment for 2 years but admits she has not been consistent with her bisphosphonate since then.  She finds it challenging to take it with her thyroid medication and having to space and time things.  She has been extremely consistent with doing weightbearing exercise since being diagnosed years ago.  She has never been a smoker but was around a heavy secondhand smoker for about 6 years when she was younger.  She is just extremely fearful that she will fall and fractured her hip.  She takes 2500 IUs of vitamin D daily and 1500 mg of calcium daily with magnesium.  Currently she is dealing with 2 hairline fractures in her left foot she is wearing a cam walker boot today.  Note: She did experience some dental issues while on Forteo.  Past Medical History:  Diagnosis Date  . Genital herpes   . Thyroid disease     No past surgical history on file.  Family History  Problem Relation Age of Onset  . Heart disease Mother   . Hypertension Mother   . Stroke Mother   . Breast cancer Sister     Social History   Socioeconomic History  . Marital status: Married    Spouse name: Not on file  . Number of children: Not on file  . Years of education: Not on file  . Highest education level: Not on file  Occupational History  . Not on file  Tobacco Use  . Smoking status: Never Smoker  . Smokeless  tobacco: Never Used  Substance and Sexual Activity  . Alcohol use: Yes    Alcohol/week: 5.0 standard drinks    Types: 5 Standard drinks or equivalent per week    Comment: weekly  . Drug use: Never  . Sexual activity: Not Currently    Partners: Male  Other Topics Concern  . Not on file  Social History Narrative  . Not on file   Social Determinants of Health   Financial Resource Strain:   . Difficulty of Paying Living Expenses:   Food Insecurity:   . Worried About Charity fundraiser in the Last Year:   . Arboriculturist in the Last Year:   Transportation Needs:   . Film/video editor (Medical):   Marland Kitchen Lack of Transportation (Non-Medical):   Physical Activity:   . Days of Exercise per Week:   . Minutes of Exercise per Session:   Stress:   . Feeling of Stress :   Social Connections:   . Frequency of Communication with Friends and Family:   . Frequency of Social Gatherings with Friends and Family:   . Attends Religious Services:   . Active Member of Clubs or Organizations:   . Attends Archivist Meetings:   Marland Kitchen Marital Status:  Intimate Partner Violence:   . Fear of Current or Ex-Partner:   . Emotionally Abused:   Marland Kitchen Physically Abused:   . Sexually Abused:     Outpatient Medications Prior to Visit  Medication Sig Dispense Refill  . alendronate (FOSAMAX) 70 MG tablet Take 70 mg by mouth once a week. Take with a full glass of water on an empty stomach.    Marland Kitchen ascorbic acid (VITAMIN C) 100 MG tablet Take by mouth.    Marland Kitchen b complex vitamins tablet Take by mouth.    Marland Kitchen CALCIUM PO Take 1,500 mg by mouth daily.    . clonazePAM (KLONOPIN) 0.5 MG tablet Take 0.5 tablets (0.25 mg total) by mouth at bedtime as needed. This is a 30 day supply 10 tablet 1  . escitalopram (LEXAPRO) 10 MG tablet Take 15 mg by mouth daily.    Marland Kitchen estradiol (CLIMARA - DOSED IN MG/24 HR) 0.025 mg/24hr patch Place 0.025 mg onto the skin once a week.    . levothyroxine (SYNTHROID) 75 MCG tablet Take 1  tablet (75 mcg total) by mouth daily. 90 tablet 1  . liothyronine (CYTOMEL) 25 MCG tablet Take 0.5 tablets (12.5 mcg total) by mouth daily. 45 tablet 1  . MAGNESIUM PO Take 1,000 mg by mouth daily.    . Multiple Vitamin (THERA) TABS Take by mouth.    . progesterone (PROMETRIUM) 200 MG capsule Take 200 mg by mouth at bedtime.    . valACYclovir (VALTREX) 500 MG tablet Take by mouth.    Marland Kitchen VITAMIN D PO Take 2,500 mg by mouth daily.    . progesterone 200 MG SUPP Place 200 mg vaginally at bedtime.     No facility-administered medications prior to visit.    No Known Allergies  ROS Review of Systems    Objective:    Physical Exam  BP 110/64   Pulse 78   Ht _0  (1.676 m)   Wt 129 lb (58.5 kg)   LMP 04/03/2010   SpO2 100%   BMI 20.82 kg/m  Wt Readings from Last 3 Encounters:  01/01/20 129 lb (58.5 kg)  11/19/19 129 lb (58.5 kg)  09/29/16 130 lb (59 kg)     Health Maintenance Due  Topic Date Due  . PAP SMEAR-Modifier  11/19/2019  . PNA vac Low Risk Adult (1 of 2 - PCV13) 12/10/2019    There are no preventive care reminders to display for this patient.  No results found for: TSH No results found for: WBC, HGB, HCT, MCV, PLT No results found for: NA, K, CHLORIDE, CO2, GLUCOSE, BUN, CREATININE, BILITOT, ALKPHOS, AST, ALT, PROT, ALBUMIN, CALCIUM, ANIONGAP, EGFR, GFR No results found for: CHOL No results found for: HDL No results found for: LDLCALC No results found for: TRIG No results found for: CHOLHDL No results found for: HGBA1C    Assessment & Plan:   Problem List Items Addressed This Visit      Musculoskeletal and Integument   Osteoporosis - Primary    At this point because she has had a significant drop in her T score to -4.1 I think we should consider alternative treatments she is already done a treatment of Forteo x2 years that as far as I am aware she has not able to retry that during her lifetime.  She might be a candidate for one of the newer drugs called  Evenity.  I would like to refer her to endocrine for further consultation continue with supplementation of calcium and vitamin D  and weightbearing exercise which she has been faithful with.  Did discuss that after treatment with Evenity she would absolutely have to be very consistent with a bisphosphonate to help maintain her bone density afterwards.  We also discussed the possibility of Prolia as an option as well.  She was just a little bit fearful of doing an injection that last 6 months in case she had side effects.      Relevant Medications   VITAMIN D PO   CALCIUM PO   Other Relevant Orders   Ambulatory referral to Endocrinology    Other Visit Diagnoses    Stress fracture of metatarsal bone of left foot, initial encounter          No orders of the defined types were placed in this encounter.   Follow-up: Return if symptoms worsen or fail to improve.   Time spent 22 minutes in encounter.  [name], MD

## 2020-01-06 DIAGNOSIS — M84375D Stress fracture, left foot, subsequent encounter for fracture with routine healing: Secondary | ICD-10-CM | POA: Diagnosis not present

## 2020-01-13 ENCOUNTER — Other Ambulatory Visit: Payer: Self-pay

## 2020-01-13 ENCOUNTER — Ambulatory Visit
Admission: RE | Admit: 2020-01-13 | Discharge: 2020-01-13 | Disposition: A | Discharge status: Home / Self Care | Payer: PPO | Source: Ambulatory Visit | Attending: Family Medicine | Admitting: Family Medicine

## 2020-01-13 DIAGNOSIS — M9901 Segmental and somatic dysfunction of cervical region: Secondary | ICD-10-CM | POA: Diagnosis not present

## 2020-01-13 DIAGNOSIS — M9902 Segmental and somatic dysfunction of thoracic region: Secondary | ICD-10-CM | POA: Diagnosis not present

## 2020-01-13 DIAGNOSIS — M9905 Segmental and somatic dysfunction of pelvic region: Secondary | ICD-10-CM | POA: Diagnosis not present

## 2020-01-13 DIAGNOSIS — M9903 Segmental and somatic dysfunction of lumbar region: Secondary | ICD-10-CM | POA: Diagnosis not present

## 2020-01-13 DIAGNOSIS — Z1231 Encounter for screening mammogram for malignant neoplasm of breast: Secondary | ICD-10-CM | POA: Diagnosis not present

## 2020-01-21 ENCOUNTER — Ambulatory Visit: Payer: PPO | Admitting: Internal Medicine

## 2020-01-21 ENCOUNTER — Encounter: Payer: Self-pay | Admitting: Internal Medicine

## 2020-01-21 ENCOUNTER — Other Ambulatory Visit: Payer: Self-pay

## 2020-01-21 VITALS — BP 122/78 | HR 77 | Ht 66.0 in | Wt 130.4 lb

## 2020-01-21 DIAGNOSIS — M810 Age-related osteoporosis without current pathological fracture: Secondary | ICD-10-CM | POA: Diagnosis not present

## 2020-01-21 LAB — COMPREHENSIVE METABOLIC PANEL
ALT: 20 U/L (ref 0–35)
AST: 24 U/L (ref 0–37)
Albumin: 4.3 g/dL (ref 3.5–5.2)
Alkaline Phosphatase: 77 U/L (ref 39–117)
BUN: 14 mg/dL (ref 6–23)
CO2: 29 mEq/L (ref 19–32)
Calcium: 9.4 mg/dL (ref 8.4–10.5)
Chloride: 104 mEq/L (ref 96–112)
Creatinine, Ser: 0.77 mg/dL (ref 0.40–1.20)
GFR: 75.21 mL/min (ref >60.00)
Glucose, Bld: 70 mg/dL (ref 70–99)
Potassium: 4.4 mEq/L (ref 3.5–5.1)
Sodium: 139 mEq/L (ref 135–145)
Total Bilirubin: 0.6 mg/dL (ref 0.2–1.2)
Total Protein: 6.3 g/dL (ref 6.0–8.3)

## 2020-01-21 LAB — TSH: TSH: 1.57 u[IU]/mL (ref 0.35–4.50)

## 2020-01-21 LAB — VITAMIN D 25 HYDROXY (VIT D DEFICIENCY, FRACTURES): VITD: 79.48 ng/mL (ref 30.00–100.00)

## 2020-01-21 NOTE — Patient Instructions (Signed)
-   Calcium 1200  mg daily  - Continue Vitamin D 2500 mg daily

## 2020-01-21 NOTE — Progress Notes (Signed)
Name: Sheila Harris  MRN/ DOB: [id number], [birth year]    Age/ Sex: 64 y.o., female    PCP: Hali Marry, MD   Reason for Endocrinology Evaluation: Osteoporosis      Date of Initial Endocrinology Evaluation: 01/21/2020     HPI: [name] is a 64 y.o. female with a past medical history of Osteoporosis  And hypothyroidism. The patient presented for initial endocrinology clinic visit on 01/22/2020 for consultative assistance with her Osteoporosis    Pt was diagnosed with osteoporosis: 2005  Menarche at age : 43 Menopausal at age : age 42 Fracture Hx: Stress fracture of left foot, toes Hx of HRT: Yes- started a few months ago  through [facility] had hot flashes just during heat  FH of osteoporosis or hip fracture:  Unknown  Prior Hx of anti-estrogenic therapy : no  Prior Hx of anti-resorptive therapy : Fosamax- 2005. Forteo (2017-2019) was transitioned to fosamax ( pt declined reclast at the time ) Pt admits to non-compliance with   Calcium 1500 mg  Mag 1500 mg daily  Vitamin D 2500 iu daily    No personal hx of CVA Pt does not smoke   HISTORY:  Past Medical History:  Past Medical History:  Diagnosis Date  . Genital herpes   . Thyroid disease     Past Surgical History: No past surgical history on file.   Social History:  reports that she has never smoked. She has never used smokeless tobacco. She reports current alcohol use of about 5.0 standard drinks of alcohol per week. She reports that she does not use drugs.  Family History: family history includes Breast cancer in her sister; Heart disease in her mother; Hypertension in her mother; Stroke in her mother.   HOME MEDICATIONS: Allergies as of 01/21/2020   No Known Allergies     Medication List       Accurate as of January 21, 2020 11:59 PM. If you have any questions, ask your nurse or doctor.        alendronate 70 MG tablet Commonly known as: FOSAMAX Take 70 mg by mouth once a  week. Take with a full glass of water on an empty stomach.   ascorbic acid 100 MG tablet Commonly known as: VITAMIN C Take by mouth.   b complex vitamins tablet Take by mouth.   CALCIUM PO Take 1,500 mg by mouth daily.   clonazePAM 0.5 MG tablet Commonly known as: KLONOPIN Take 0.5 tablets (0.25 mg total) by mouth at bedtime as needed. This is a 30 day supply   escitalopram 10 MG tablet Commonly known as: LEXAPRO Take 15 mg by mouth daily.   estradiol 0.025 mg/24hr patch Commonly known as: CLIMARA - Dosed in mg/24 hr Place 0.025 mg onto the skin once a week.   levothyroxine 75 MCG tablet Commonly known as: SYNTHROID Take 1 tablet (75 mcg total) by mouth daily.   liothyronine 25 MCG tablet Commonly known as: CYTOMEL Take 0.5 tablets (12.5 mcg total) by mouth daily.   MAGNESIUM PO Take 1,000 mg by mouth daily.   progesterone 200 MG capsule Commonly known as: PROMETRIUM Take 200 mg by mouth at bedtime.   Thera Tabs Take by mouth.   valACYclovir 500 MG tablet Commonly known as: VALTREX Take by mouth.   VITAMIN D PO Take 2,500 mg by mouth daily.         REVIEW OF SYSTEMS: A comprehensive ROS was conducted with  the patient and is negative except as per HPI     OBJECTIVE:  VS: BP 122/78 (BP Location: Right Arm, Patient Position: Sitting, Cuff Size: Normal)   Pulse 77   Ht 5\' 6"  (1.676 m)   Wt 130 lb 6.4 oz (59.1 kg)   LMP 04/03/2010   SpO2 95%   BMI 21.05 kg/m    Wt Readings from Last 3 Encounters:  01/21/20 130 lb 6.4 oz (59.1 kg)  01/01/20 129 lb (58.5 kg)  11/19/19 129 lb (58.5 kg)     EXAM: General: Pt appears well and is in NAD  Neck: General: Supple without adenopathy. Thyroid: Thyroid size normal.  No goiter or nodules appreciated. No thyroid bruit.  Lungs: Clear with good BS bilat with no rales, rhonchi, or wheezes  Heart: Auscultation: RRR.  Abdomen: Normoactive bowel sounds, soft, nontender, without masses or organomegaly palpable    Extremities:  BL LE: No pretibial edema on right, left foot boot in place   Skin: Hair: Texture and amount normal with gender appropriate distribution Skin Inspection: No rashes Skin Palpation: Skin temperature, texture, and thickness normal to palpation  Neuro: Cranial nerves: II - XII grossly intact  DTRs: 2+ and symmetric in UE without delay in relaxation phase  Mental Status: Judgment, insight: Intact Orientation: Oriented to time, place, and person Mood and affect: No depression, anxiety, or agitation     DATA REVIEWED: Results for KATHLYNN, SWOFFORD (MRN [id number]) as of 01/22/2020 08:27  Ref. Range 01/21/2020 10:16  Sodium Latest Ref Range: 135 - 145 mEq/L 139  Potassium Latest Ref Range: 3.5 - 5.1 mEq/L 4.4  Chloride Latest Ref Range: 96 - 112 mEq/L 104  CO2 Latest Ref Range: 19 - 32 mEq/L 29  Glucose Latest Ref Range: 70 - 99 mg/dL 70  BUN Latest Ref Range: 6 - 23 mg/dL 14  Creatinine Latest Ref Range: 0.40 - 1.20 mg/dL 0.77  Calcium Latest Ref Range: 8.4 - 10.5 mg/dL 9.4  Alkaline Phosphatase Latest Ref Range: 39 - 117 U/L 77  Albumin Latest Ref Range: 3.5 - 5.2 g/dL 4.3  AST Latest Ref Range: 0 - 37 U/L 24  ALT Latest Ref Range: 0 - 35 U/L 20  Total Protein Latest Ref Range: 6.0 - 8.3 g/dL 6.3  Total Bilirubin Latest Ref Range: 0.2 - 1.2 mg/dL 0.6  GFR Latest Ref Range: >60.00 mL/min 75.21  VITD Latest Ref Range: 30.00 - 100.00 ng/mL 79.48  TSH Latest Ref Range: 0.35 - 4.50 uIU/mL 1.57      11/02/2015 Change from '05 12/04/2017 Change from '17 2021 Change from '2019  AP spine  -3.2 Down 6.5%* -2.8 Up 6.7% -4.1 Down 18.5%*  LFN -3.5 Down 22.6%* -2.7 Up 20.1 %  -3.0 Down 9.9% *  RFN -2.7  -2.5  -3.0   *- significant  ASSESSMENT/PLAN/RECOMMENDATIONS:   1. Osteoporosis :  - Pt was on Fosamax for a few years around 2005. She was subsequently started on Forteo from 2017-2019 with a 6.7% increase in spine BMD and 20.1 % in hip BMD . She was offered reclast at the time but  declined and opted to restart Fosamax.  - Pt admits to non-compliance with fosamax, repeat DXA showed worsening of BMD. - Pt interested in [place] , we did discuss the side effects of local injection reaction and the rare risk of CVA. I did express my concern about her being on HRT , this seems to have started for her bone health, hot flahses weren't excessive ,  I explained to her that HRT is NOT FDA approved for osteoporosis due to increased risk of breat ca, CVA, CVD and dementia. I have strongly advised her to discuss these concerns with her [facility]ative [facility] Provider.   Medications :  Calcium 1200 mg daily  Decrease Vitamin D3 to 2000 iu daily  Start Evenity (Romosozumab) 210 mg [place] monthly x 12 months    F/U in 6 months   Signed electronically by: Mack Guise, MD  [facility]  [address] [place],  [address] Phone: [phone] FAX: [phone]   CC: Hali Marry, [address] [place] [address] Phone: [phone] Fax: [phone]   Return to Endocrinology clinic as below: Future Appointments  Date Time Provider [place]  05/20/2020 10:50 AM Hali Marry, MD [facility] None  07/27/2020 11:10 AM Lakela Kuba, Melanie Crazier, MD [facility]

## 2020-01-22 ENCOUNTER — Encounter: Payer: Self-pay | Admitting: Internal Medicine

## 2020-01-22 LAB — PARATHYROID HORMONE, INTACT (NO CA): PTH: 19 pg/mL (ref 14–64)

## 2020-01-27 DIAGNOSIS — M9905 Segmental and somatic dysfunction of pelvic region: Secondary | ICD-10-CM | POA: Diagnosis not present

## 2020-01-27 DIAGNOSIS — M9901 Segmental and somatic dysfunction of cervical region: Secondary | ICD-10-CM | POA: Diagnosis not present

## 2020-01-27 DIAGNOSIS — M810 Age-related osteoporosis without current pathological fracture: Secondary | ICD-10-CM | POA: Diagnosis not present

## 2020-01-27 DIAGNOSIS — M9903 Segmental and somatic dysfunction of lumbar region: Secondary | ICD-10-CM | POA: Diagnosis not present

## 2020-01-27 DIAGNOSIS — M9902 Segmental and somatic dysfunction of thoracic region: Secondary | ICD-10-CM | POA: Diagnosis not present

## 2020-01-27 DIAGNOSIS — M25572 Pain in left ankle and joints of left foot: Secondary | ICD-10-CM | POA: Diagnosis not present

## 2020-02-05 ENCOUNTER — Telehealth: Payer: Self-pay | Admitting: Internal Medicine

## 2020-02-05 DIAGNOSIS — M84375D Stress fracture, left foot, subsequent encounter for fracture with routine healing: Secondary | ICD-10-CM | POA: Diagnosis not present

## 2020-02-05 NOTE — Telephone Encounter (Signed)
Patient called stating she discussed with Dr Kelton Pillar a medication she could take for the osteoporosis but had to wait on approval from her insurance, I do not see any encounters/notes relating to this. Please advise. Ph# [phone] (patient gives consent to leave detailed message)

## 2020-02-05 NOTE — Telephone Encounter (Signed)
Dr. Kelton Pillar, I see in the last office visit note, [place] was mentioned. Is this the medication that was supposed to have been checked into?

## 2020-02-06 NOTE — Patient Outreach (Signed)
Sheila Harris called asking for help with her medication cost.  A referral was sent to [facility].

## 2020-02-06 NOTE — Telephone Encounter (Signed)
Mel Almond contacted rep to get patient estimate cost (approx $300/month). She will contact patient to determine if she would like to proceed.

## 2020-02-09 ENCOUNTER — Other Ambulatory Visit: Payer: Self-pay | Admitting: Family Medicine

## 2020-02-09 DIAGNOSIS — F419 Anxiety disorder, unspecified: Secondary | ICD-10-CM

## 2020-02-09 NOTE — Telephone Encounter (Signed)
Spoke with patient about cost. She states she would like to talk with her insurance to see if there is any possible loan or assistance program prior to signing up. She advised she would call me back with further info.

## 2020-02-09 NOTE — Telephone Encounter (Signed)
Last filled 12/10/2019 #10 and 1 refill Last appt 01/01/2020

## 2020-02-12 ENCOUNTER — Telehealth: Payer: Self-pay | Admitting: Internal Medicine

## 2020-02-12 NOTE — Telephone Encounter (Signed)
Patient came in and dropped off forms for patient assistance, informed her that paperwork takes 7-10 business days and I informed her there could be a fee. Just FYI

## 2020-02-13 ENCOUNTER — Encounter: Payer: Self-pay | Admitting: Family Medicine

## 2020-02-13 DIAGNOSIS — T148XXA Other injury of unspecified body region, initial encounter: Secondary | ICD-10-CM | POA: Diagnosis not present

## 2020-02-13 DIAGNOSIS — M84374D Stress fracture, right foot, subsequent encounter for fracture with routine healing: Secondary | ICD-10-CM | POA: Diagnosis not present

## 2020-02-13 MED ORDER — ALENDRONATE SODIUM 70 MG PO TABS: 70.0000 mg | ORAL_TABLET | ORAL | 1 refills | Status: DC

## 2020-02-17 ENCOUNTER — Telehealth: Payer: PPO | Admitting: Family Medicine

## 2020-02-17 DIAGNOSIS — Z1152 Encounter for screening for COVID-19: Secondary | ICD-10-CM | POA: Diagnosis not present

## 2020-02-17 DIAGNOSIS — M9904 Segmental and somatic dysfunction of sacral region: Secondary | ICD-10-CM | POA: Diagnosis not present

## 2020-02-17 DIAGNOSIS — M9901 Segmental and somatic dysfunction of cervical region: Secondary | ICD-10-CM | POA: Diagnosis not present

## 2020-02-17 DIAGNOSIS — M5032 Other cervical disc degeneration, mid-cervical region, unspecified level: Secondary | ICD-10-CM | POA: Diagnosis not present

## 2020-02-17 DIAGNOSIS — M9903 Segmental and somatic dysfunction of lumbar region: Secondary | ICD-10-CM | POA: Diagnosis not present

## 2020-02-17 DIAGNOSIS — M5137 Other intervertebral disc degeneration, lumbosacral region: Secondary | ICD-10-CM | POA: Diagnosis not present

## 2020-02-17 DIAGNOSIS — R112 Nausea with vomiting, unspecified: Secondary | ICD-10-CM | POA: Diagnosis not present

## 2020-02-17 DIAGNOSIS — Z03818 Encounter for observation for suspected exposure to other biological agents ruled out: Secondary | ICD-10-CM | POA: Diagnosis not present

## 2020-02-17 DIAGNOSIS — M5136 Other intervertebral disc degeneration, lumbar region: Secondary | ICD-10-CM | POA: Diagnosis not present

## 2020-02-24 DIAGNOSIS — R2681 Unsteadiness on feet: Secondary | ICD-10-CM | POA: Diagnosis not present

## 2020-02-24 DIAGNOSIS — M9906 Segmental and somatic dysfunction of lower extremity: Secondary | ICD-10-CM | POA: Diagnosis not present

## 2020-02-24 DIAGNOSIS — R531 Weakness: Secondary | ICD-10-CM | POA: Diagnosis not present

## 2020-02-24 DIAGNOSIS — M25572 Pain in left ankle and joints of left foot: Secondary | ICD-10-CM | POA: Diagnosis not present

## 2020-02-24 DIAGNOSIS — R269 Unspecified abnormalities of gait and mobility: Secondary | ICD-10-CM | POA: Diagnosis not present

## 2020-02-24 DIAGNOSIS — M810 Age-related osteoporosis without current pathological fracture: Secondary | ICD-10-CM | POA: Diagnosis not present

## 2020-02-25 DIAGNOSIS — D485 Neoplasm of uncertain behavior of skin: Secondary | ICD-10-CM | POA: Diagnosis not present

## 2020-02-26 ENCOUNTER — Encounter: Payer: Self-pay | Admitting: Internal Medicine

## 2020-03-08 ENCOUNTER — Telehealth: Payer: Self-pay

## 2020-03-08 NOTE — Telephone Encounter (Signed)
New message    Need the provider transaction access number sent to [employer]  .   Fax  [phone]

## 2020-03-17 DIAGNOSIS — L02416 Cutaneous abscess of left lower limb: Secondary | ICD-10-CM | POA: Diagnosis not present

## 2020-03-19 NOTE — Telephone Encounter (Signed)
See latest message sent to patient. FYI.

## 2020-03-19 NOTE — Telephone Encounter (Signed)
Correct, she did not qualify due to her insurance

## 2020-03-23 DIAGNOSIS — M9904 Segmental and somatic dysfunction of sacral region: Secondary | ICD-10-CM | POA: Diagnosis not present

## 2020-03-23 DIAGNOSIS — M5032 Other cervical disc degeneration, mid-cervical region, unspecified level: Secondary | ICD-10-CM | POA: Diagnosis not present

## 2020-03-23 DIAGNOSIS — M9903 Segmental and somatic dysfunction of lumbar region: Secondary | ICD-10-CM | POA: Diagnosis not present

## 2020-03-23 DIAGNOSIS — M9901 Segmental and somatic dysfunction of cervical region: Secondary | ICD-10-CM | POA: Diagnosis not present

## 2020-03-23 DIAGNOSIS — M5136 Other intervertebral disc degeneration, lumbar region: Secondary | ICD-10-CM | POA: Diagnosis not present

## 2020-03-23 DIAGNOSIS — M5137 Other intervertebral disc degeneration, lumbosacral region: Secondary | ICD-10-CM | POA: Diagnosis not present

## 2020-03-25 ENCOUNTER — Encounter: Payer: Self-pay | Admitting: Family Medicine

## 2020-03-25 ENCOUNTER — Telehealth: Payer: Self-pay

## 2020-03-25 DIAGNOSIS — M25572 Pain in left ankle and joints of left foot: Secondary | ICD-10-CM | POA: Diagnosis not present

## 2020-03-25 NOTE — Telephone Encounter (Signed)
Patient is requesting a call back in reference prolia injection

## 2020-03-25 NOTE — Telephone Encounter (Deleted)
error 

## 2020-03-26 DIAGNOSIS — M79645 Pain in left finger(s): Secondary | ICD-10-CM | POA: Diagnosis not present

## 2020-03-26 DIAGNOSIS — M65332 Trigger finger, left middle finger: Secondary | ICD-10-CM | POA: Diagnosis not present

## 2020-03-26 NOTE — Telephone Encounter (Signed)
Yes, okay to get set up for Prolia.  We will just have to make sure that she has the appropriate labs and that we have order the Prolia and its in stock to give it.

## 2020-03-30 DIAGNOSIS — M25572 Pain in left ankle and joints of left foot: Secondary | ICD-10-CM | POA: Diagnosis not present

## 2020-04-06 ENCOUNTER — Other Ambulatory Visit: Payer: Self-pay

## 2020-04-06 DIAGNOSIS — M25572 Pain in left ankle and joints of left foot: Secondary | ICD-10-CM | POA: Diagnosis not present

## 2020-04-06 DIAGNOSIS — M810 Age-related osteoporosis without current pathological fracture: Secondary | ICD-10-CM

## 2020-04-06 MED ORDER — DENOSUMAB 60 MG/ML ~~LOC~~ SOSY: 60.0000 mg | PREFILLED_SYRINGE | Freq: Once | SUBCUTANEOUS | 0 refills | Status: AC

## 2020-04-06 NOTE — Telephone Encounter (Signed)
Sheila Harris states it is cheaper to pick up the prescription at the pharmacy and bring to a nurse visit. She would like the prescription sent to [facility].

## 2020-04-06 NOTE — Telephone Encounter (Signed)
Adanely is going to pick it up through the pharmacy.

## 2020-04-06 NOTE — Telephone Encounter (Signed)
rx sent

## 2020-04-06 NOTE — Telephone Encounter (Signed)
Ordered labs for Prolia.  

## 2020-04-06 NOTE — Addendum Note (Signed)
Addended by: Narda Rutherford on: 04/06/2020 01:12 PM   Modules accepted: Orders

## 2020-04-09 DIAGNOSIS — M810 Age-related osteoporosis without current pathological fracture: Secondary | ICD-10-CM | POA: Diagnosis not present

## 2020-04-10 LAB — COMPLETE METABOLIC PANEL WITH GFR
AG Ratio: 2.1 (calc) (ref 1.0–2.5)
ALT: 19 U/L (ref 6–29)
AST: 24 U/L (ref 10–35)
Albumin: 4.4 g/dL (ref 3.6–5.1)
Alkaline phosphatase (APISO): 72 U/L (ref 37–153)
BUN: 13 mg/dL (ref 7–25)
CO2: 24 mmol/L (ref 20–32)
Calcium: 9.6 mg/dL (ref 8.6–10.4)
Chloride: 106 mmol/L (ref 98–110)
Creat: 0.77 mg/dL (ref 0.50–0.99)
GFR, Est African American: 94 mL/min/{1.73_m2} (ref >=60)
GFR, Est Non African American: 81 mL/min/{1.73_m2} (ref >=60)
Globulin: 2.1 g/dL (calc) (ref 1.9–3.7)
Glucose, Bld: 82 mg/dL (ref 65–99)
Potassium: 5 mmol/L (ref 3.5–5.3)
Sodium: 139 mmol/L (ref 135–146)
Total Bilirubin: 0.7 mg/dL (ref 0.2–1.2)
Total Protein: 6.5 g/dL (ref 6.1–8.1)

## 2020-04-11 NOTE — Telephone Encounter (Signed)
All labs are normal. 

## 2020-04-13 DIAGNOSIS — M25572 Pain in left ankle and joints of left foot: Secondary | ICD-10-CM | POA: Diagnosis not present

## 2020-04-14 ENCOUNTER — Ambulatory Visit: Payer: PPO

## 2020-04-20 ENCOUNTER — Encounter: Payer: Self-pay | Admitting: Family Medicine

## 2020-04-20 ENCOUNTER — Ambulatory Visit (INDEPENDENT_AMBULATORY_CARE_PROVIDER_SITE_OTHER): Payer: PPO | Admitting: Family Medicine

## 2020-04-20 VITALS — BP 112/65 | HR 80 | Ht 66.0 in | Wt 136.0 lb

## 2020-04-20 DIAGNOSIS — M25572 Pain in left ankle and joints of left foot: Secondary | ICD-10-CM | POA: Diagnosis not present

## 2020-04-20 DIAGNOSIS — M810 Age-related osteoporosis without current pathological fracture: Secondary | ICD-10-CM

## 2020-04-20 DIAGNOSIS — S81802A Unspecified open wound, left lower leg, initial encounter: Secondary | ICD-10-CM

## 2020-04-20 MED ORDER — ALENDRONATE SODIUM 70 MG PO TABS: 70.0000 mg | ORAL_TABLET | ORAL | 1 refills | Status: DC

## 2020-04-20 NOTE — Assessment & Plan Note (Signed)
She has not decided whether or not she wants to start the Prolia or not so she would like to continue with the Fosamax for at least another month.  Prescription sent to pharmacy.  We did discuss the pros and cons of the Prolia I think she would actually be a really good candidate and would probably do well with minimal side effects.

## 2020-04-20 NOTE — Addendum Note (Signed)
Addended by: Teddy Spike on: 04/20/2020 05:20 PM   Modules accepted: Orders

## 2020-04-20 NOTE — Progress Notes (Signed)
Acute Office Visit  Subjective:    Patient ID: Sheila Harris, female    DOB: 08-25-1955, 64 y.o.   MRN: [id number]  Chief Complaint  Patient presents with  . Rash    HPI    Patient is in today for wound on her left lower leg after having surgical excision of skin lesion out 8 weeks ago.  She says the lesion was almost a melanoma.  Says about 6 weeks ago she had to go back into the office to have it drained because it was swollen and infected.  They did do a culture at that time and it grew out Enterobacter cloaca K.  She has been on 2 rounds of Keflex and a round of Bactrim.  The Bactrim should have actually cleared up the infection based on the sensitivities that she showed me on her phone.  She says it is still draining daily is not nearly as swollen she has been placing mupirocin ointment on it twice a day for almost 8 weeks at this point.  As well as using Aquaphor.  No fever or chills.  She has had 2 rounds of ABX, Bactrim DS and Keflex. Has used Bactroban ointment.  Currently using Aquaphor and Bactroban.    Past Medical History:  Diagnosis Date  . Genital herpes   . Thyroid disease     History reviewed. No pertinent surgical history.  Family History  Problem Relation Age of Onset  . Heart disease Mother   . Hypertension Mother   . Stroke Mother   . Breast cancer Sister     Social History   Socioeconomic History  . Marital status: Married    Spouse name: Not on file  . Number of children: Not on file  . Years of education: Not on file  . Highest education level: Not on file  Occupational History  . Not on file  Tobacco Use  . Smoking status: Never Smoker  . Smokeless tobacco: Never Used  Substance and Sexual Activity  . Alcohol use: Yes    Alcohol/week: 5.0 standard drinks    Types: 5 Standard drinks or equivalent per week    Comment: weekly  . Drug use: Never  . Sexual activity: Not Currently    Partners: Male  Other Topics Concern  . Not on file   Social History Narrative  . Not on file   Social Determinants of Health   Financial Resource Strain:   . Difficulty of Paying Living Expenses: Not on file  Food Insecurity:   . Worried About Charity fundraiser in the Last Year: Not on file  . Ran Out of Food in the Last Year: Not on file  Transportation Needs:   . Lack of Transportation (Medical): Not on file  . Lack of Transportation (Non-Medical): Not on file  Physical Activity:   . Days of Exercise per Week: Not on file  . Minutes of Exercise per Session: Not on file  Stress:   . Feeling of Stress : Not on file  Social Connections:   . Frequency of Communication with Friends and Family: Not on file  . Frequency of Social Gatherings with Friends and Family: Not on file  . Attends Religious Services: Not on file  . Active Member of Clubs or Organizations: Not on file  . Attends Archivist Meetings: Not on file  . Marital Status: Not on file  Intimate Partner Violence:   . Fear of Current or Ex-Partner: Not on  file  . Emotionally Abused: Not on file  . Physically Abused: Not on file  . Sexually Abused: Not on file    Outpatient Medications Prior to Visit  Medication Sig Dispense Refill  . ascorbic acid (VITAMIN C) 100 MG tablet Take by mouth.    Marland Kitchen b complex vitamins tablet Take by mouth.    Marland Kitchen CALCIUM PO Take 1,500 mg by mouth daily.    . clonazePAM (KLONOPIN) 0.5 MG tablet 1/2 TABLET AT BEDTIME AS NEEDED FOR ANXIETY ORALLY 30 DAYS 10 tablet 1  . escitalopram (LEXAPRO) 10 MG tablet Take 15 mg by mouth daily.    Marland Kitchen estradiol (CLIMARA - DOSED IN MG/24 HR) 0.025 mg/24hr patch Place 0.025 mg onto the skin once a week.    . levothyroxine (SYNTHROID) 75 MCG tablet Take 1 tablet (75 mcg total) by mouth daily. 90 tablet 1  . liothyronine (CYTOMEL) 25 MCG tablet Take 0.5 tablets (12.5 mcg total) by mouth daily. 45 tablet 1  . MAGNESIUM PO Take 1,000 mg by mouth daily.    . Multiple Vitamin (THERA) TABS Take by mouth.     . progesterone (PROMETRIUM) 200 MG capsule Take 200 mg by mouth at bedtime.    . valACYclovir (VALTREX) 500 MG tablet Take by mouth.    Marland Kitchen VITAMIN D PO Take 2,500 mg by mouth daily.    Marland Kitchen alendronate (FOSAMAX) 70 MG tablet Take 1 tablet (70 mg total) by mouth once a week. Take with a full glass of water on an empty stomach. 4 tablet 1   No facility-administered medications prior to visit.    No Known Allergies  Review of Systems     Objective:    Physical Exam Vitals reviewed.  Constitutional:      Appearance: She is well-developed.  HENT:     Head: Normocephalic and atraumatic.  Eyes:     Conjunctiva/sclera: Conjunctivae normal.  Cardiovascular:     Rate and Rhythm: Normal rate.  Pulmonary:     Effort: Pulmonary effort is normal.  Skin:    General: Skin is dry.     Coloration: Skin is not pale.     Comments: The left lower leg she has a diagonal incision at the very bottom towards the foot she has a slightly open area measuring approximately 1 cm.  And towards the top of the lesion she has a much more open wound measuring about 1-1/2 x 1 cm with some serous and bloody drainage.  Part of the incision is actually sealed and closed but it does have a fair amount of erythema and duskiness around the borders.  Neurological:     Mental Status: She is alert and oriented to person, place, and time.  Psychiatric:        Behavior: Behavior normal.     BP 112/65   Pulse 80   Ht 5\' 6"  (1.676 m)   Wt 136 lb (61.7 kg)   LMP 04/03/2010   SpO2 100%   BMI 21.95 kg/m  Wt Readings from Last 3 Encounters:  04/20/20 136 lb (61.7 kg)  01/21/20 130 lb 6.4 oz (59.1 kg)  01/01/20 129 lb (58.5 kg)    Health Maintenance Due  Topic Date Due  . PAP SMEAR-Modifier  11/19/2019  . PNA vac Low Risk Adult (1 of 2 - PCV13) Never done  . INFLUENZA VACCINE  01/18/2020    There are no preventive care reminders to display for this patient.   Lab Results  Component Value Date  TSH 1.57  01/21/2020   No results found for: WBC, HGB, HCT, MCV, PLT Lab Results  Component Value Date   NA 139 04/09/2020   K 5.0 04/09/2020   CO2 24 04/09/2020   GLUCOSE 82 04/09/2020   BUN 13 04/09/2020   CREATININE 0.77 04/09/2020   BILITOT 0.7 04/09/2020   ALKPHOS 77 01/21/2020   AST 24 04/09/2020   ALT 19 04/09/2020   PROT 6.5 04/09/2020   ALBUMIN 4.3 01/21/2020   CALCIUM 9.6 04/09/2020   GFR 75.21 01/21/2020   Lab Results  Component Value Date   CHOL 181 07/02/2019   Lab Results  Component Value Date   HDL 63 07/02/2019   Lab Results  Component Value Date   LDLCALC 106 07/02/2019   Lab Results  Component Value Date   TRIG 65 07/02/2019   No results found for: CHOLHDL Lab Results  Component Value Date   HGBA1C 5.3 10/29/2019       Assessment & Plan:   Problem List Items Addressed This Visit      Musculoskeletal and Integument   Osteoporosis    She has not decided whether or not she wants to start the Prolia or not so she would like to continue with the Fosamax for at least another month.  Prescription sent to pharmacy.  We did discuss the pros and cons of the Prolia I think she would actually be a really good candidate and would probably do well with minimal side effects.      Relevant Medications   alendronate (FOSAMAX) 70 MG tablet    Other Visit Diagnoses    Wound, open, leg, left, initial encounter    -  Primary   Relevant Orders   Ambulatory referral to Dermatology   Wound culture     Open wounds/p surgical excision- I  did express some drainage after cleaning the wound and culture was obtained will send out for further evaluation.  We discussed some wound care.  She is going discontinue the Bactroban completely and just use Aquaphor with nonstick gauze and then the compression stocking during the day around dinnertime she will take off the compression stocking and just put some loose nonstick gauze since it does tend to drain at night with a little bit  of the Aquaphor on it as well.  Discussed keeping it well moisturized with the wet Aquaphor.  Not using any other products on the area getting her in with a new dermatologist to work on wound healing.  Call dermatology and get copy of records so that we can have her pathology report on file.  Meds ordered this encounter  Medications  . alendronate (FOSAMAX) 70 MG tablet    Sig: Take 1 tablet (70 mg total) by mouth once a week. Take with a full glass of water on an empty stomach.    Dispense:  4 tablet    Refill:  1     [name], MD

## 2020-04-23 ENCOUNTER — Ambulatory Visit: Payer: PPO

## 2020-04-23 ENCOUNTER — Encounter: Payer: Self-pay | Admitting: Family Medicine

## 2020-04-23 LAB — WOUND CULTURE
MICRO NUMBER:: 11149483
RESULT:: NO GROWTH
SPECIMEN QUALITY:: ADEQUATE

## 2020-04-27 DIAGNOSIS — M25572 Pain in left ankle and joints of left foot: Secondary | ICD-10-CM | POA: Diagnosis not present

## 2020-05-04 DIAGNOSIS — M9902 Segmental and somatic dysfunction of thoracic region: Secondary | ICD-10-CM | POA: Diagnosis not present

## 2020-05-04 DIAGNOSIS — M25572 Pain in left ankle and joints of left foot: Secondary | ICD-10-CM | POA: Diagnosis not present

## 2020-05-04 DIAGNOSIS — M9903 Segmental and somatic dysfunction of lumbar region: Secondary | ICD-10-CM | POA: Diagnosis not present

## 2020-05-04 DIAGNOSIS — M9905 Segmental and somatic dysfunction of pelvic region: Secondary | ICD-10-CM | POA: Diagnosis not present

## 2020-05-04 DIAGNOSIS — M9901 Segmental and somatic dysfunction of cervical region: Secondary | ICD-10-CM | POA: Diagnosis not present

## 2020-05-06 ENCOUNTER — Other Ambulatory Visit: Payer: Self-pay

## 2020-05-06 ENCOUNTER — Encounter: Payer: Self-pay | Admitting: Family Medicine

## 2020-05-06 ENCOUNTER — Ambulatory Visit (INDEPENDENT_AMBULATORY_CARE_PROVIDER_SITE_OTHER): Payer: PPO | Admitting: Family Medicine

## 2020-05-06 VITALS — BP 118/69 | HR 87 | Ht 66.0 in | Wt 133.0 lb

## 2020-05-06 DIAGNOSIS — S81802A Unspecified open wound, left lower leg, initial encounter: Secondary | ICD-10-CM

## 2020-05-06 NOTE — Progress Notes (Signed)
Established Patient Office Visit  Subjective:  Patient ID: Sheila Harris, female    DOB: 1956-04-25  Age: 64 y.o. MRN: [id number]  CC:  Chief Complaint  Patient presents with  . Wound Check    HPI Sheila Harris presents for f/U wound.  He is here today for follow-up wound check.  She had a surgical excision done on her left lower leg about 10 weeks ago and unfortunately got a secondary infection.  She had already had two rounds of antibiotics and been using Bactroban ointment.  We changed her wound care regimen.  She has been cleaning it with Hibiclens patting dry and applying Aquaphor she is putting a gauze over it and wearing a compression stocking as she had significant swelling previously.  And then at night she is removing the compression stocking and just keeping some gauze lightly taped to the skin so that it does not drain onto her clothing and sheets or get rubbed.  She is cleaning it in the shower with a little bit of Cetaphil.  She still having a little bit of drainage from the wound but very small amount.  She is still a little bit tender.  One area is closing and the other is still slightly open.  Past Medical History:  Diagnosis Date  . Genital herpes   . Thyroid disease     No past surgical history on file.  Family History  Problem Relation Age of Onset  . Heart disease Mother   . Hypertension Mother   . Stroke Mother   . Breast cancer Sister     Social History   Socioeconomic History  . Marital status: Married    Spouse name: Not on file  . Number of children: Not on file  . Years of education: Not on file  . Highest education level: Not on file  Occupational History  . Not on file  Tobacco Use  . Smoking status: Never Smoker  . Smokeless tobacco: Never Used  Substance and Sexual Activity  . Alcohol use: Yes    Alcohol/week: 5.0 standard drinks    Types: 5 Standard drinks or equivalent per week    Comment: weekly  . Drug use: Never  . Sexual  activity: Not Currently    Partners: Male  Other Topics Concern  . Not on file  Social History Narrative  . Not on file   Social Determinants of Health   Financial Resource Strain:   . Difficulty of Paying Living Expenses: Not on file  Food Insecurity:   . Worried About Charity fundraiser in the Last Year: Not on file  . Ran Out of Food in the Last Year: Not on file  Transportation Needs:   . Lack of Transportation (Medical): Not on file  . Lack of Transportation (Non-Medical): Not on file  Physical Activity:   . Days of Exercise per Week: Not on file  . Minutes of Exercise per Session: Not on file  Stress:   . Feeling of Stress : Not on file  Social Connections:   . Frequency of Communication with Friends and Family: Not on file  . Frequency of Social Gatherings with Friends and Family: Not on file  . Attends Religious Services: Not on file  . Active Member of Clubs or Organizations: Not on file  . Attends Archivist Meetings: Not on file  . Marital Status: Not on file  Intimate Partner Violence:   . Fear of Current or Ex-Partner:  Not on file  . Emotionally Abused: Not on file  . Physically Abused: Not on file  . Sexually Abused: Not on file    Outpatient Medications Prior to Visit  Medication Sig Dispense Refill  . alendronate (FOSAMAX) 70 MG tablet Take 1 tablet (70 mg total) by mouth once a week. Take with a full glass of water on an empty stomach. 4 tablet 1  . ascorbic acid (VITAMIN C) 100 MG tablet Take by mouth.    Marland Kitchen b complex vitamins tablet Take by mouth.    Marland Kitchen CALCIUM PO Take 1,500 mg by mouth daily.    . clonazePAM (KLONOPIN) 0.5 MG tablet 1/2 TABLET AT BEDTIME AS NEEDED FOR ANXIETY ORALLY 30 DAYS 10 tablet 1  . escitalopram (LEXAPRO) 10 MG tablet Take 15 mg by mouth daily.    Marland Kitchen estradiol (CLIMARA - DOSED IN MG/24 HR) 0.025 mg/24hr patch Place 0.025 mg onto the skin once a week.    . levothyroxine (SYNTHROID) 75 MCG tablet Take 1 tablet (75 mcg  total) by mouth daily. 90 tablet 1  . liothyronine (CYTOMEL) 25 MCG tablet Take 0.5 tablets (12.5 mcg total) by mouth daily. 45 tablet 1  . MAGNESIUM PO Take 1,000 mg by mouth daily.    . Multiple Vitamin (THERA) TABS Take by mouth.    . progesterone (PROMETRIUM) 200 MG capsule Take 200 mg by mouth at bedtime.    . valACYclovir (VALTREX) 500 MG tablet Take by mouth.    Marland Kitchen VITAMIN D PO Take 2,500 mg by mouth daily.     No facility-administered medications prior to visit.    No Known Allergies  ROS Review of Systems    Objective:    Physical Exam  Please see picture of the wound below.  The more open lesion is measuring approximately 0.5 x 1.0 cm.  The bottom lesion is fairly shallow but it looks like it is closing.  She is still a little tender no significant erythema.  She does still have some Aquaphor over the wound and the photograph it does have a shiny appearance but I did not see any active drainage.   BP 118/69   Pulse 87   Ht 5\' 6"  (1.676 m)   Wt 133 lb (60.3 kg)   LMP 04/03/2010   SpO2 98%   BMI 21.47 kg/m  Wt Readings from Last 3 Encounters:  05/06/20 133 lb (60.3 kg)  04/20/20 136 lb (61.7 kg)  01/21/20 130 lb 6.4 oz (59.1 kg)     Health Maintenance Due  Topic Date Due  . PAP SMEAR-Modifier  11/19/2019  . PNA vac Low Risk Adult (1 of 2 - PCV13) Never done    There are no preventive care reminders to display for this patient.  Lab Results  Component Value Date   TSH 1.57 01/21/2020   No results found for: WBC, HGB, HCT, MCV, PLT Lab Results  Component Value Date   NA 139 04/09/2020   K 5.0 04/09/2020   CO2 24 04/09/2020   GLUCOSE 82 04/09/2020   BUN 13 04/09/2020   CREATININE 0.77 04/09/2020   BILITOT 0.7 04/09/2020   ALKPHOS 77 01/21/2020   AST 24 04/09/2020   ALT 19 04/09/2020   PROT 6.5 04/09/2020   ALBUMIN 4.3 01/21/2020   CALCIUM 9.6 04/09/2020   GFR 75.21 01/21/2020   Lab Results  Component Value Date   CHOL 181 07/02/2019   Lab  Results  Component Value Date   HDL 63 07/02/2019  Lab Results  Component Value Date   LDLCALC 106 07/02/2019   Lab Results  Component Value Date   TRIG 65 07/02/2019   No results found for: [facility] Lab Results  Component Value Date   HGBA1C 5.3 10/29/2019      Assessment & Plan:   Problem List Items Addressed This Visit    None    Visit Diagnoses    Wound, open, leg, left, initial encounter    -  Primary      Wound is healing very well.  I know it is not completely closed but it is significantly better than previous the upper wound is about half the size that it was previously so I think you are making great strides.  Continue with current wound care regimen she does not have to cover if she is just going to be at home and able to wear pants that will rub the area but just continue to keep it moisturized with the Aquaphor.  Believe she has a follow-up with dermatology may be in December so they can also recheck the wound at that time.  No orders of the defined types were placed in this encounter.   Follow-up: No follow-ups on file.    [name], MD

## 2020-05-07 ENCOUNTER — Telehealth: Payer: Self-pay

## 2020-05-07 ENCOUNTER — Other Ambulatory Visit: Payer: Self-pay | Admitting: Family Medicine

## 2020-05-07 DIAGNOSIS — F419 Anxiety disorder, unspecified: Secondary | ICD-10-CM

## 2020-05-07 MED ORDER — CLONAZEPAM 0.5 MG PO TABS: ORAL_TABLET | ORAL | 1 refills | Status: DC

## 2020-05-07 NOTE — Telephone Encounter (Signed)
Med refilled.  Just a reminder to try to use very sparingly.  I know she does try but just remind her.  These medicines  are not recommended for people over 65 so I really just want a make sure that we are using them very sparingly.

## 2020-05-07 NOTE — Telephone Encounter (Signed)
Sheila Harris called and left a message asking why the Clonazepam was denied.

## 2020-05-10 NOTE — Telephone Encounter (Signed)
Patient advised.

## 2020-05-11 DIAGNOSIS — M25572 Pain in left ankle and joints of left foot: Secondary | ICD-10-CM | POA: Diagnosis not present

## 2020-05-16 ENCOUNTER — Encounter: Payer: Self-pay | Admitting: Family Medicine

## 2020-05-20 ENCOUNTER — Other Ambulatory Visit: Payer: Self-pay

## 2020-05-20 ENCOUNTER — Encounter: Payer: Self-pay | Admitting: Family Medicine

## 2020-05-20 ENCOUNTER — Ambulatory Visit (INDEPENDENT_AMBULATORY_CARE_PROVIDER_SITE_OTHER): Payer: PPO | Admitting: Family Medicine

## 2020-05-20 VITALS — BP 119/69 | HR 79 | Ht 66.0 in | Wt 135.0 lb

## 2020-05-20 DIAGNOSIS — M810 Age-related osteoporosis without current pathological fracture: Secondary | ICD-10-CM | POA: Diagnosis not present

## 2020-05-20 DIAGNOSIS — F419 Anxiety disorder, unspecified: Secondary | ICD-10-CM | POA: Diagnosis not present

## 2020-05-20 DIAGNOSIS — E039 Hypothyroidism, unspecified: Secondary | ICD-10-CM

## 2020-05-20 MED ORDER — ALENDRONATE SODIUM 70 MG PO TABS: 70.0000 mg | ORAL_TABLET | ORAL | 3 refills | Status: DC

## 2020-05-20 MED ORDER — LEVOTHYROXINE SODIUM 75 MCG PO TABS: 75.0000 ug | ORAL_TABLET | Freq: Every day | ORAL | 1 refills | Status: DC

## 2020-05-20 NOTE — Assessment & Plan Note (Signed)
She will be changing generics thsi week. Plan to recheck TSH in 6-8 weeks.

## 2020-05-20 NOTE — Assessment & Plan Note (Signed)
Does report some depression and anxiety symptoms but still working with her therapist. Has a new dog and that has been really positive for her.  Encouraged her to let me know if she feels her symptoms are worsening.

## 2020-05-20 NOTE — Assessment & Plan Note (Signed)
Will start Fosamax, doing well so far.

## 2020-05-20 NOTE — Progress Notes (Signed)
Established Patient Office Visit  Subjective:  Patient ID: Sheila Harris, female    DOB: 05-29-1956  Age: 64 y.o. MRN: [id number]  CC:  Chief Complaint  Patient presents with   Hypothyroidism    HPI Sheila Harris presents for   Hypothyroidism - Taking medication regularly in the AM away from food and vitamins, etc. No recent change to skin, hair, or energy levels.  Get notification from the pharmacy that they will be changing her generic of her thyroid medication she is going to pick that up in the next day or 2.  He also reports that she is still under a lot of stress she worries very much about her husband and her son whose health is not good and they do not seem very motivated to take good care of themselves.  She still seeing a therapist every 6 weeks and says that is been helpful.  Past Medical History:  Diagnosis Date   Genital herpes    Thyroid disease     No past surgical history on file.  Family History  Problem Relation Age of Onset   Heart disease Mother    Hypertension Mother    Stroke Mother    Breast cancer Sister     Social History   Socioeconomic History   Marital status: Married    Spouse name: Not on file   Number of children: Not on file   Years of education: Not on file   Highest education level: Not on file  Occupational History   Not on file  Tobacco Use   Smoking status: Never Smoker   Smokeless tobacco: Never Used  Substance and Sexual Activity   Alcohol use: Yes    Alcohol/week: 5.0 standard drinks    Types: 5 Standard drinks or equivalent per week    Comment: weekly   Drug use: Never   Sexual activity: Not Currently    Partners: Male  Other Topics Concern   Not on file  Social History Narrative   Not on file   Social Determinants of Health   Financial Resource Strain:    Difficulty of Paying Living Expenses: Not on file  Food Insecurity:    Worried About [place] in the Last Year: Not on file    Ran Out of Food in the Last Year: Not on file  Transportation Needs:    Lack of Transportation (Medical): Not on file   Lack of Transportation (Non-Medical): Not on file  Physical Activity:    Days of Exercise per Week: Not on file   Minutes of Exercise per Session: Not on file  Stress:    Feeling of Stress : Not on file  Social Connections:    Frequency of Communication with Friends and Family: Not on file   Frequency of Social Gatherings with Friends and Family: Not on file   Attends Religious Services: Not on file   Active Member of Clubs or Organizations: Not on file   Attends Archivist Meetings: Not on file   Marital Status: Not on file  Intimate Partner Violence:    Fear of Current or Ex-Partner: Not on file   Emotionally Abused: Not on file   Physically Abused: Not on file   Sexually Abused: Not on file    Outpatient Medications Prior to Visit  Medication Sig Dispense Refill   ascorbic acid (VITAMIN C) 100 MG tablet Take by mouth.     b complex vitamins tablet Take by mouth.  CALCIUM PO Take 1,500 mg by mouth daily.     clonazePAM (KLONOPIN) 0.5 MG tablet 1/2 TABLET AT BEDTIME AS NEEDED FOR ANXIETY ORALLY 30 DAYS 10 tablet 1   escitalopram (LEXAPRO) 10 MG tablet Take 15 mg by mouth daily.     estradiol (CLIMARA - DOSED IN MG/24 HR) 0.025 mg/24hr patch Place 0.025 mg onto the skin once a week.     liothyronine (CYTOMEL) 25 MCG tablet Take 0.5 tablets (12.5 mcg total) by mouth daily. 45 tablet 1   MAGNESIUM PO Take 1,000 mg by mouth daily.     Multiple Vitamin (THERA) TABS Take by mouth.     progesterone (PROMETRIUM) 200 MG capsule Take 200 mg by mouth at bedtime.     valACYclovir (VALTREX) 500 MG tablet Take by mouth.     VITAMIN D PO Take 2,500 mg by mouth daily.     alendronate (FOSAMAX) 70 MG tablet Take 1 tablet (70 mg total) by mouth once a week. Take with a full glass of water on an empty stomach. 4 tablet 1    levothyroxine (SYNTHROID) 75 MCG tablet Take 1 tablet (75 mcg total) by mouth daily. 90 tablet 1   No facility-administered medications prior to visit.    No Known Allergies  ROS Review of Systems    Objective:    Physical Exam Constitutional:      Appearance: She is well-developed.  HENT:     Head: Normocephalic and atraumatic.  Cardiovascular:     Rate and Rhythm: Normal rate and regular rhythm.     Heart sounds: Normal heart sounds.  Pulmonary:     Effort: Pulmonary effort is normal.     Breath sounds: Normal breath sounds.  Musculoskeletal:     Cervical back: No tenderness.  Lymphadenopathy:     Cervical: No cervical adenopathy.  Skin:    General: Skin is warm and dry.  Neurological:     Mental Status: She is alert and oriented to person, place, and time.  Psychiatric:        Behavior: Behavior normal.     BP 119/69    Pulse 79    Ht 5\' 6"  (1.676 m)    Wt 135 lb (61.2 kg)    LMP 04/03/2010    SpO2 100%    BMI 21.79 kg/m  Wt Readings from Last 3 Encounters:  05/20/20 135 lb (61.2 kg)  05/06/20 133 lb (60.3 kg)  04/20/20 136 lb (61.7 kg)     Health Maintenance Due  Topic Date Due   PAP SMEAR-Modifier  11/19/2019   PNA vac Low Risk Adult (1 of 2 - PCV13) Never done    There are no preventive care reminders to display for this patient.  Lab Results  Component Value Date   TSH 1.57 01/21/2020   No results found for: WBC, HGB, HCT, MCV, PLT Lab Results  Component Value Date   NA 139 04/09/2020   K 5.0 04/09/2020   CO2 24 04/09/2020   GLUCOSE 82 04/09/2020   BUN 13 04/09/2020   CREATININE 0.77 04/09/2020   BILITOT 0.7 04/09/2020   ALKPHOS 77 01/21/2020   AST 24 04/09/2020   ALT 19 04/09/2020   PROT 6.5 04/09/2020   ALBUMIN 4.3 01/21/2020   CALCIUM 9.6 04/09/2020   GFR 75.21 01/21/2020   Lab Results  Component Value Date   CHOL 181 07/02/2019   Lab Results  Component Value Date   HDL 63 07/02/2019   Lab Results  Component  Value Date    LDLCALC 106 07/02/2019   Lab Results  Component Value Date   TRIG 65 07/02/2019   No results found for: [facility] Lab Results  Component Value Date   HGBA1C 5.3 10/29/2019      Assessment & Plan:   Problem List Items Addressed This Visit      Endocrine   Hypothyroidism    She will be changing generics thsi week. Plan to recheck TSH in 6-8 weeks.       Relevant Medications   levothyroxine (SYNTHROID) 75 MCG tablet     Musculoskeletal and Integument   Osteoporosis - Primary    Will start Fosamax, doing well so far.        Relevant Medications   alendronate (FOSAMAX) 70 MG tablet     Other   Anxiety    Does report some depression and anxiety symptoms but still working with her therapist. Has a new dog and that has been really positive for her.  Encouraged her to let me know if she feels her symptoms are worsening.           Keep appt in jan for CPE.    Meds ordered this encounter  Medications   alendronate (FOSAMAX) 70 MG tablet    Sig: Take 1 tablet (70 mg total) by mouth once a week. Take with a full glass of water on an empty stomach.    Dispense:  12 tablet    Refill:  3   levothyroxine (SYNTHROID) 75 MCG tablet    Sig: Take 1 tablet (75 mcg total) by mouth daily.    Dispense:  90 tablet    Refill:  1    Follow-up: No follow-ups on file.     [name], MD

## 2020-05-24 DIAGNOSIS — M9903 Segmental and somatic dysfunction of lumbar region: Secondary | ICD-10-CM | POA: Diagnosis not present

## 2020-05-24 DIAGNOSIS — M5137 Other intervertebral disc degeneration, lumbosacral region: Secondary | ICD-10-CM | POA: Diagnosis not present

## 2020-05-24 DIAGNOSIS — M5136 Other intervertebral disc degeneration, lumbar region: Secondary | ICD-10-CM | POA: Diagnosis not present

## 2020-05-24 DIAGNOSIS — M9901 Segmental and somatic dysfunction of cervical region: Secondary | ICD-10-CM | POA: Diagnosis not present

## 2020-05-24 DIAGNOSIS — M9904 Segmental and somatic dysfunction of sacral region: Secondary | ICD-10-CM | POA: Diagnosis not present

## 2020-05-24 DIAGNOSIS — M5032 Other cervical disc degeneration, mid-cervical region, unspecified level: Secondary | ICD-10-CM | POA: Diagnosis not present

## 2020-05-25 DIAGNOSIS — M25572 Pain in left ankle and joints of left foot: Secondary | ICD-10-CM | POA: Diagnosis not present

## 2020-05-26 ENCOUNTER — Other Ambulatory Visit: Payer: Self-pay

## 2020-05-26 ENCOUNTER — Encounter (HOSPITAL_BASED_OUTPATIENT_CLINIC_OR_DEPARTMENT_OTHER): Payer: PPO | Attending: Physician Assistant | Admitting: Physician Assistant

## 2020-05-26 DIAGNOSIS — L97822 Non-pressure chronic ulcer of other part of left lower leg with fat layer exposed: Secondary | ICD-10-CM | POA: Diagnosis not present

## 2020-05-26 DIAGNOSIS — X58XXXA Exposure to other specified factors, initial encounter: Secondary | ICD-10-CM | POA: Diagnosis not present

## 2020-05-26 DIAGNOSIS — M25572 Pain in left ankle and joints of left foot: Secondary | ICD-10-CM | POA: Diagnosis not present

## 2020-05-26 DIAGNOSIS — T8131XA Disruption of external operation (surgical) wound, not elsewhere classified, initial encounter: Secondary | ICD-10-CM | POA: Insufficient documentation

## 2020-05-27 ENCOUNTER — Encounter: Payer: Self-pay | Admitting: Family Medicine

## 2020-05-27 NOTE — Progress Notes (Signed)
Sheila Harris, CORBIT ([id number]) Visit Report for 05/26/2020 Abuse/Suicide Risk Screen Details Patient Name: Date of Service: V ISI, Sheila Harris [name] 05/26/2020 10:30 A M Medical Record Number: [id number] Patient Account Number: [id number] Date of Birth/Sex: Treating RN: 1956-01-02 (64 y.o. Sheila Harris Primary Care Vonita Calloway: [name] Other Clinician: Referring Emoni Whitworth: Treating Karren Newland/Extender: Ree Edman in Treatment: 0 Abuse/Suicide Risk Screen Items Answer ABUSE RISK SCREEN: Has anyone close to you tried to hurt or harm you recentlyo No Do you feel uncomfortable with anyone in your familyo No Has anyone forced you do things that you didnt want to doo No Electronic Signature(s) Signed: 05/27/2020 4:05:55 PM By: Levan Hurst RN, BSN Entered By: Levan Hurst on 05/26/2020 11:43:26 -------------------------------------------------------------------------------- Activities of Daily Living Details Patient Name: Date of Service: V ISI, Sheila [name] 05/26/2020 10:30 A M Medical Record Number: [id number] Patient Account Number: [id number] Date of Birth/Sex: Treating RN: 11-Nov-1955 (64 y.o. Sheila Harris Primary Care Sheila Harris: [name] Other Clinician: Referring Kennon Encinas: Treating Sheila Harris/Extender: Ree Edman in Treatment: 0 Activities of Daily Living Items Answer Activities of Daily Living (Please select one for each item) Drive Automobile Completely Able T Medications ake Completely Able Use T elephone Completely Able Care for Appearance Completely Able Use T oilet Completely Able Bath / Shower Completely Able Dress Self Completely Able Feed Self Completely Able Walk Completely Able Get In / Out Bed Completely Able Housework Completely Able Prepare Meals Completely [place] for Self Completely Able Electronic Signature(s) Signed: 05/27/2020 4:05:55 PM By:  Levan Hurst RN, BSN Entered By: Levan Hurst on 05/26/2020 11:43:43 -------------------------------------------------------------------------------- Education Screening Details Patient Name: Date of Service: Sheila Harris, Sheila [name] 05/26/2020 10:30 A M Medical Record Number: [id number] Patient Account Number: [id number] Date of Birth/Sex: Treating RN: 07/01/1955 (64 y.o. Sheila Harris Primary Care Michelina Mexicano: [name] Other Clinician: Referring Analiya Porco: Treating Sheila Harris/Extender: Ree Edman in Treatment: 0 Primary Learner Assessed: Patient Learning Preferences/Education Level/Primary Language Learning Preference: Explanation, Demonstration, Printed Material Highest Education Level: High School Preferred Language: English Cognitive Barrier Language Barrier: No Translator Needed: No Memory Deficit: No Emotional Barrier: No Cultural/Religious Beliefs Affecting Medical Care: No Physical Barrier Impaired Vision: No Impaired Hearing: No Decreased Hand dexterity: No Knowledge/Comprehension Knowledge Level: High Comprehension Level: High Ability to understand written instructions: High Ability to understand verbal instructions: High Motivation Anxiety Level: Calm Cooperation: Cooperative Education Importance: Acknowledges Need Interest in Health Problems: Asks Questions Perception: Coherent Willingness to Engage in Self-Management High Activities: Readiness to Engage in Self-Management High Activities: Electronic Signature(s) Signed: 05/27/2020 4:05:55 PM By: Levan Hurst RN, BSN Entered By: Levan Hurst on 05/26/2020 11:44:56 -------------------------------------------------------------------------------- Fall Risk Assessment Details Patient Name: Date of Service: V ISI, Sheila [name] 05/26/2020 10:30 A M Medical Record Number: [id number] Patient Account Number: [id number] Date of Birth/Sex: Treating RN: July 19, 1955 (64 y.o. Sheila Harris Primary Care Sheila Harris: [name] Other Clinician: Referring Sheila Harris: Treating Sheila Harris/Extender: Ree Edman in Treatment: 0 Fall Risk Assessment Items Have you had 2 or more falls in the last 12 monthso 0 No Have you had any fall that resulted in injury in the last 12 monthso 0 No FALLS RISK SCREEN History of falling - immediate or within 3 months 0 No Secondary diagnosis (Do you have 2 or more medical diagnoseso) 0 No Ambulatory aid None/bed rest/wheelchair/nurse 0 Yes Crutches/cane/walker 0 No Furniture 0 No Intravenous therapy Access/Saline/Heparin Lock 0  No Gait/Transferring Normal/ bed rest/ wheelchair 0 Yes Weak (short steps with or without shuffle, stooped but able to lift head while walking, may seek 0 No support from furniture) Impaired (short steps with shuffle, may have difficulty arising from chair, head down, impaired 0 No balance) Mental Status Oriented to own ability 0 Yes Electronic Signature(s) Signed: 05/27/2020 4:05:55 PM By: Levan Hurst RN, BSN Entered By: Levan Hurst on 05/26/2020 11:44:30 -------------------------------------------------------------------------------- Foot Assessment Details Patient Name: Date of Service: Sheila Harris, Sheila [name] 05/26/2020 10:30 A M Medical Record Number: [id number] Patient Account Number: [id number] Date of Birth/Sex: Treating RN: 01/19/1956 (64 y.o. Sheila Harris Primary Care Levell Tavano: [name] Other Clinician: Referring Sheila Harris: Treating Sheila Harris/Extender: Ree Edman in Treatment: 0 Foot Assessment Items Site Locations + = Sensation present, - = Sensation absent, C = Callus, U = Ulcer R = Redness, W = Warmth, M = Maceration, PU = Pre-ulcerative lesion F = Fissure, S = Swelling, D = Dryness Assessment Right: Left: Other Deformity: No No Prior Foot Ulcer: No No Prior Amputation: No No Charcot Joint: No  No Ambulatory Status: Ambulatory Without Help Gait: Steady Electronic Signature(s) Signed: 05/27/2020 4:05:55 PM By: Levan Hurst RN, BSN Entered By: Levan Hurst on 05/26/2020 11:59:25 -------------------------------------------------------------------------------- Nutrition Risk Screening Details Patient Name: Date of Service: Sheila Harris, Sheila [name] 05/26/2020 10:30 A M Medical Record Number: [id number] Patient Account Number: [id number] Date of Birth/Sex: Treating RN: 17-Aug-1955 (64 y.o. Sheila Harris Primary Care Riddick Nuon: [name] Other Clinician: Referring Miakoda Mcmillion: Treating Ellerie Arenz/Extender: Ree Edman in Treatment: 0 Height (in): 66 Weight (lbs): 130 Body Mass Index (BMI): 21 Nutrition Risk Screening Items Score Screening NUTRITION RISK SCREEN: I have an illness or condition that made me change the kind and/or amount of food I eat 0 No I eat fewer than two meals per day 0 No I eat few fruits and vegetables, or milk products 0 No I have three or more drinks of beer, liquor or wine almost every day 0 No I have tooth or mouth problems that make it hard for me to eat 0 No I don't always have enough money to buy the food I need 0 No I eat alone most of the time 0 No I take three or more different prescribed or over-the-counter drugs a day 1 Yes Without wanting to, I have lost or gained 10 pounds in the last six months 0 No I am not always physically able to shop, cook and/or feed myself 0 No Nutrition Protocols Good Risk Protocol Moderate Risk Protocol 0 Provide education on nutrition High Risk Proctocol Risk Level: Good Risk Score: 1 Electronic Signature(s) Signed: 05/27/2020 4:05:55 PM By: Levan Hurst RN, BSN Entered By: Levan Hurst on 05/26/2020 11:44:46

## 2020-05-27 NOTE — Telephone Encounter (Signed)
Just an FYI

## 2020-05-27 NOTE — Progress Notes (Signed)
EMRYN, FLANERY ([id number]) Visit Report for 05/26/2020 Allergy List Details Patient Name: Date of Service: V ISI, BO [name] 05/26/2020 10:30 A M Medical Record Number: [id number] Patient Account Number: [id number] Date of Birth/Sex: Treating RN: [birth year] (64 y.o. [name] Primary Care Sierrah Luevano: [name] Other Clinician: Referring Paxten Appelt: Treating Keelan Pomerleau/Extender: Ree Edman in Treatment: 0 Allergies Active Allergies No Known Drug Allergies Allergy Notes Electronic Signature(s) Signed: 05/27/2020 4:05:55 PM By: Levan Hurst RN, BSN Entered By: Levan Hurst on 05/26/2020 11:41:18 -------------------------------------------------------------------------------- Arrival Information Details Patient Name: Date of Service: Lovenia Shuck, BO [name] 05/26/2020 10:30 A M Medical Record Number: [id number] Patient Account Number: [id number] Date of Birth/Sex: Treating RN: January 19, 1956 (64 y.o. [name] Primary Care Ayelen Sciortino: [name] Other Clinician: Referring Josetta Wigal: Treating Keary Hanak/Extender: Ree Edman in Treatment: 0 Visit Information Patient Arrived: Ambulatory Arrival Time: 11:35 Accompanied By: alone Transfer Assistance: None Patient Identification Verified: Yes Secondary Verification Process Completed: Yes Patient Requires Transmission-Based Precautions: No Patient Has Alerts: No Electronic Signature(s) Signed: 05/27/2020 4:05:55 PM By: Levan Hurst RN, BSN Entered By: Levan Hurst on 05/26/2020 11:39:41 -------------------------------------------------------------------------------- Clinic Level of Care Assessment Details Patient Name: Date of Service: V ISI, BO [name] 05/26/2020 10:30 A M Medical Record Number: [id number] Patient Account Number: [id number] Date of Birth/Sex: Treating RN: 1955-11-16 (64 y.o. Elam Dutch Primary Care Shamell Hittle: [name]  Other Clinician: Referring Chelsye Suhre: Treating Ronald Londo/Extender: Ree Edman in Treatment: 0 Clinic Level of Care Assessment Items TOOL 2 Quantity Score []  - 0 Use when only an EandM is performed on the INITIAL visit ASSESSMENTS - Nursing Assessment / Reassessment X- 1 20 General Physical Exam (combine w/ comprehensive assessment (listed just below) when performed on new pt. evals) X- 1 25 Comprehensive Assessment (HX, ROS, Risk Assessments, Wounds Hx, etc.) ASSESSMENTS - Wound and Skin A ssessment / Reassessment []  - 0 Simple Wound Assessment / Reassessment - one wound X- 2 5 Complex Wound Assessment / Reassessment - multiple wounds []  - 0 Dermatologic / Skin Assessment (not related to wound area) ASSESSMENTS - Ostomy and/or Continence Assessment and Care []  - 0 Incontinence Assessment and Management []  - 0 Ostomy Care Assessment and Management (repouching, etc.) PROCESS - Coordination of Care X - Simple Patient / Family Education for ongoing care 1 15 []  - 0 Complex (extensive) Patient / Family Education for ongoing care X- 1 10 Staff obtains Programmer, systems, Records, T Results / Process Orders est []  - 0 Staff telephones HHA, Nursing Homes / Clarify orders / etc []  - 0 Routine Transfer to another Facility (non-emergent condition) []  - 0 Routine Hospital Admission (non-emergent condition) X- 1 15 New Admissions / Biomedical engineer / Ordering NPWT Apligraf, etc. , []  - 0 Emergency Hospital Admission (emergent condition) X- 1 10 Simple Discharge Coordination []  - 0 Complex (extensive) Discharge Coordination PROCESS - Special Needs []  - 0 Pediatric / Minor Patient Management []  - 0 Isolation Patient Management []  - 0 Hearing / Language / Visual special needs []  - 0 Assessment of Community assistance (transportation, D/C planning, etc.) []  - 0 Additional assistance / Altered mentation []  - 0 Support Surface(s) Assessment (bed,  cushion, seat, etc.) INTERVENTIONS - Wound Cleansing / Measurement X- 1 5 Wound Imaging (photographs - any number of wounds) []  - 0 Wound Tracing (instead of photographs) []  - 0 Simple Wound Measurement - one wound X- 2 5 Complex Wound Measurement - multiple wounds []  - 0  Simple Wound Cleansing - one wound X- 2 5 Complex Wound Cleansing - multiple wounds INTERVENTIONS - Wound Dressings X - Small Wound Dressing one or multiple wounds 2 10 []  - 0 Medium Wound Dressing one or multiple wounds []  - 0 Large Wound Dressing one or multiple wounds []  - 0 Application of Medications - injection INTERVENTIONS - Miscellaneous []  - 0 External ear exam []  - 0 Specimen Collection (cultures, biopsies, blood, body fluids, etc.) []  - 0 Specimen(s) / Culture(s) sent or taken to Lab for analysis []  - 0 Patient Transfer (multiple staff / Harrel Lemon Lift / Similar devices) []  - 0 Simple Staple / Suture removal (25 or less) []  - 0 Complex Staple / Suture removal (26 or more) []  - 0 Hypo / Hyperglycemic Management (close monitor of Blood Glucose) X- 1 15 Ankle / Brachial Index (ABI) - do not check if billed separately Has the patient been seen at the hospital within the last three years: Yes Total Score: 165 Level Of Care: New/Established - Level 5 Electronic Signature(s) Signed: 05/26/2020 6:12:31 PM By: Baruch Gouty RN, BSN Entered By: Baruch Gouty on 05/26/2020 12:17:12 -------------------------------------------------------------------------------- Encounter Discharge Information Details Patient Name: Date of Service: Lovenia Shuck, BO [name] 05/26/2020 10:30 A M Medical Record Number: [id number] Patient Account Number: [id number] Date of Birth/Sex: Treating RN: [birth year] (64 y.o. Helene Shoe, Tammi Klippel Primary Care Lindalou Soltis: [name] Other Clinician: Referring Demara Lover: Treating Jury Caserta/Extender: Ree Edman in Treatment: 0 Encounter Discharge  Information Items Discharge Condition: Stable Ambulatory Status: Ambulatory Discharge Destination: Home Transportation: Private Auto Accompanied By: self Schedule Follow-up Appointment: Yes Clinical Summary of Care: Electronic Signature(s) Signed: 05/26/2020 5:17:43 PM By: Deon Pilling Entered By: Deon Pilling on 05/26/2020 14:17:33 -------------------------------------------------------------------------------- Lower Extremity Assessment Details Patient Name: Date of Service: Marciano Sequin [name] 05/26/2020 10:30 A M Medical Record Number: [id number] Patient Account Number: [id number] Date of Birth/Sex: Treating RN: [birth year] (64 y.o. [name] Primary Care Shantoria Ellwood: [name] Other Clinician: Referring Cariana Karge: Treating Rolondo Pierre/Extender: Ree Edman in Treatment: 0 Edema Assessment Assessed: [Left: No] [Right: No] Edema: [Left: N] [Right: o] Calf Left: Right: Point of Measurement: 30 cm From Medial Instep 33 cm Ankle Left: Right: Point of Measurement: 10 cm From Medial Instep 19.2 cm Vascular Assessment Pulses: Dorsalis Pedis Palpable: [Left:Yes] Blood Pressure: Brachial: [Left:128] Ankle: [Left:Dorsalis Pedis: 142 1.11] Electronic Signature(s) Signed: 05/27/2020 4:05:55 PM By: Levan Hurst RN, BSN Entered By: Levan Hurst on 05/26/2020 11:57:16 -------------------------------------------------------------------------------- Multi-Disciplinary Care Plan Details Patient Name: Date of Service: Lovenia Shuck, BO [name] 05/26/2020 10:30 A M Medical Record Number: [id number] Patient Account Number: [id number] Date of Birth/Sex: Treating RN: 1955/09/02 (64 y.o. Elam Dutch Primary Care Darenda Fike: [name] Other Clinician: Referring Yanis Juma: Treating Alaura Schippers/Extender: Ree Edman in Treatment: 0 Active Inactive Wound/Skin Impairment Nursing Diagnoses: Impaired tissue  integrity Knowledge deficit related to ulceration/compromised skin integrity Goals: Patient/caregiver will verbalize understanding of skin care regimen Date Initiated: 05/26/2020 Target Resolution Date: 06/23/2020 Goal Status: Active Ulcer/skin breakdown will have a volume reduction of 30% by week 4 Date Initiated: 05/26/2020 Target Resolution Date: 06/23/2020 Goal Status: Active Interventions: Assess patient/caregiver ability to obtain necessary supplies Assess patient/caregiver ability to perform ulcer/skin care regimen upon admission and as needed Assess ulceration(s) every visit Treatment Activities: Skin care regimen initiated : 05/26/2020 Topical wound management initiated : 05/26/2020 Notes: Electronic Signature(s) Signed: 05/26/2020 6:12:31 PM By: Baruch Gouty RN, BSN Entered By: Baruch Gouty on 05/26/2020  12:15:30 -------------------------------------------------------------------------------- Pain Assessment Details Patient Name: Date of Service: V ISI, [name] [name] 05/26/2020 10:30 A M Medical Record Number: [id number] Patient Account Number: [id number] Date of Birth/Sex: Treating RN: [birth year] (64 y.o. [name] Primary Care Tempestt Silba: [name] Other Clinician: Referring Ricco Dershem: Treating Dorraine Ellender/Extender: Ree Edman in Treatment: 0 Active Problems Location of Pain Severity and Description of Pain Patient Has Paino No Site Locations Pain Management and Medication Current Pain Management: Electronic Signature(s) Signed: 05/27/2020 4:05:55 PM By: Levan Hurst RN, BSN Entered By: Levan Hurst on 05/26/2020 11:45:15 -------------------------------------------------------------------------------- Patient/Caregiver Education Details Patient Name: Date of Service: Lovenia Shuck, BO [name] 12/8/2021andnbsp10:30 A M Medical Record Number: [id number] Patient Account Number: [id number] Date of Birth/Gender: Treating  RN: 1955-10-08 (64 y.o. Elam Dutch Primary Care Physician: [name] Other Clinician: Referring Physician: Treating Physician/Extender: Ree Edman in Treatment: 0 Education Assessment Education Provided To: Patient Education Topics Provided [place]: o Handouts: Welcome T [facility] o Methods: Explain/Verbal, Printed Responses: Reinforcements needed, State content correctly Wound/Skin Impairment: Handouts: Caring for Your Ulcer, Skin Care Do's and Dont's Methods: Explain/Verbal, Printed Responses: Reinforcements needed, State content correctly Electronic Signature(s) Signed: 05/26/2020 6:12:31 PM By: Baruch Gouty RN, BSN Entered By: Baruch Gouty on 05/26/2020 12:16:09 -------------------------------------------------------------------------------- Wound Assessment Details Patient Name: Date of Service: Lovenia Shuck, BO [name] 05/26/2020 10:30 A M Medical Record Number: [id number] Patient Account Number: [id number] Date of Birth/Sex: Treating RN: July 20, 1955 (64 y.o. [name] Primary Care Keagen Heinlen: [name] Other Clinician: Referring Oshay Stranahan: Treating William Laske/Extender: Ree Edman in Treatment: 0 Wound Status Wound Number: 1 Primary Etiology: Dehisced Wound Wound Location: Left, Proximal, Medial Lower Leg Wound Status: Open Wounding Event: Surgical Injury Comorbid History: Osteoarthritis Date Acquired: 02/25/2020 Weeks Of Treatment: 0 Clustered Wound: No Wound Measurements Length: (cm) 0.6 Width: (cm) 0.5 Depth: (cm) 0.2 Area: (cm) 0.236 Volume: (cm) 0.047 % Reduction in Area: 0% % Reduction in Volume: 0% Epithelialization: None Tunneling: Yes Position (o'clock): 6 Maximum Distance: (cm) 0.8 Undermining: No Wound Description Classification: Full Thickness Without Exposed Support Structures Wound Margin: Well defined, not  attached Exudate Amount: Small Exudate Type: Serosanguineous Exudate Color: red, brown Foul Odor After Cleansing: No Slough/Fibrino No Wound Bed Granulation Amount: Large (67-100%) Exposed Structure Granulation Quality: Pink Fascia Exposed: No Necrotic Amount: None Present (0%) Fat Layer (Subcutaneous Tissue) Exposed: Yes Tendon Exposed: No Muscle Exposed: No Joint Exposed: No Bone Exposed: No Electronic Signature(s) Signed: 05/27/2020 4:05:55 PM By: Levan Hurst RN, BSN Entered By: Levan Hurst on 05/26/2020 12:02:24 -------------------------------------------------------------------------------- Wound Assessment Details Patient Name: Date of Service: Lovenia Shuck, BO [name] 05/26/2020 10:30 A M Medical Record Number: [id number] Patient Account Number: [id number] Date of Birth/Sex: Treating RN: 04/03/1956 (64 y.o. [name] Primary Care Kashius Dominic: [name] Other Clinician: Referring Margi Edmundson: Treating Dinorah Masullo/Extender: Ree Edman in Treatment: 0 Wound Status Wound Number: 2 Primary Etiology: Dehisced Wound Wound Location: Left, Distal, Medial Lower Leg Wound Status: Open Wounding Event: Surgical Injury Comorbid History: Osteoarthritis Date Acquired: 02/25/2020 Weeks Of Treatment: 0 Clustered Wound: No Wound Measurements Length: (cm) 0.5 Width: (cm) 0.2 Depth: (cm) 0.2 Area: (cm) 0.079 Volume: (cm) 0.016 % Reduction in Area: % Reduction in Volume: Epithelialization: None Tunneling: Yes Position (o'clock): 11 Maximum Distance: (cm) 0.7 Undermining: No Wound Description Classification: Full Thickness Without Exposed Support Structu Wound Margin: Well defined, not attached Exudate Amount: Small Exudate Type:  Serosanguineous Exudate Color: red, brown res Foul Odor After Cleansing: No Slough/Fibrino No Wound Bed Granulation Amount: Large (67-100%) Exposed Structure Granulation Quality: Pink Fascia Exposed:  No Necrotic Amount: None Present (0%) Fat Layer (Subcutaneous Tissue) Exposed: Yes Tendon Exposed: No Muscle Exposed: No Joint Exposed: No Bone Exposed: No Electronic Signature(s) Signed: 05/27/2020 4:05:55 PM By: Levan Hurst RN, BSN Entered By: Levan Hurst on 05/26/2020 12:04:12 -------------------------------------------------------------------------------- Vitals Details Patient Name: Date of Service: V ISI, BO [name] 05/26/2020 10:30 A M Medical Record Number: [id number] Patient Account Number: [id number] Date of Birth/Sex: Treating RN: 29-May-1956 (64 y.o. [name] Primary Care Brennon Otterness: [name] Other Clinician: Referring Tamiki Kuba: Treating Chirstine Defrain/Extender: Ree Edman in Treatment: 0 Vital Signs Time Taken: 11:39 Temperature (F): 97.8 Height (in): 66 Pulse (bpm): 73 Source: Stated Respiratory Rate (breaths/min): 18 Weight (lbs): 130 Blood Pressure (mmHg): 128/82 Source: Stated Reference Range: 80 - 120 mg / dl Body Mass Index (BMI): 21 Electronic Signature(s) Signed: 05/27/2020 4:05:55 PM By: Levan Hurst RN, BSN Entered By: Levan Hurst on 05/26/2020 11:40:21

## 2020-05-27 NOTE — Progress Notes (Signed)
HIEDI, TOUCHTON ([id number]) Visit Report for 05/26/2020 Chief Complaint Document Details Patient Name: Date of Service: V ISI, [name] [name] 05/26/2020 10:30 A M Medical Record Number: [id number] Patient Account Number: [id number] Date of Birth/Sex: Treating RN: [birth year] (64 y.o. Elam Dutch Primary Care Provider: [name] Other Clinician: Referring Provider: Treating Provider/Extender: Ree Edman in Treatment: 0 Information Obtained from: Patient Chief Complaint Left LE surgical ulcers Electronic Signature(s) Signed: 05/26/2020 12:11:11 PM By: Worthy Keeler PA-C Entered By: Worthy Keeler on 05/26/2020 12:11:11 -------------------------------------------------------------------------------- HPI Details Patient Name: Date of Service: V ISI, BO [name] 05/26/2020 10:30 A M Medical Record Number: [id number] Patient Account Number: [id number] Date of Birth/Sex: Treating RN: [birth year] (64 y.o. Elam Dutch Primary Care Provider: [name] Other Clinician: Referring Provider: Treating Provider/Extender: Ree Edman in Treatment: 0 History of Present Illness HPI Description: 05/26/2020 patient presents today for initial evaluation in our clinic concerning issues that she tells me began following the surgery to remove a premelanoma on her left lower extremity. She tells me that following this the sutures apparently did keep everything close initially although the wound dehisced following and subsequently she had what was termed an abscess underneath. She ended up being treated with antibiotic therapy. The original surgery was on September 8 for the excision. She then ended up after this dehisced having a culture which revealed Enterobacter and she had 2 rounds of Keflex followed by round of Bactrim. Following that she was given a prescription for mupirocin ointment to be used and then to leave this  open to air. Subsequently the patient also states that she at 1 point was told to cover it by another provider. By different providers she was also told she should not cover it. Subsequently she also tells me that she has been told off and on by various providers to or not to wear compression. She is really confused about what exactly to do to be honest. Fortunately she is not having too much pain except for when she has someone checking the wound like we are today otherwise she tells me the biggest issue is the frustration with the situation. Electronic Signature(s) Signed: 05/26/2020 3:45:49 PM By: Worthy Keeler PA-C Entered By: Worthy Keeler on 05/26/2020 15:45:49 -------------------------------------------------------------------------------- Physical Exam Details Patient Name: Date of Service: V ISI, BO [name] 05/26/2020 10:30 A M Medical Record Number: [id number] Patient Account Number: [id number] Date of Birth/Sex: Treating RN: 28-Feb-1956 (64 y.o. Elam Dutch Primary Care Provider: [name] Other Clinician: Referring Provider: Treating Provider/Extender: Ree Edman in Treatment: 0 Constitutional sitting or standing blood pressure is within target range for patient.. pulse regular and within target range for patient.Marland Kitchen respirations regular, non-labored and within target range for patient.Marland Kitchen temperature within target range for patient.. Well-nourished and well-hydrated in no acute distress. Eyes conjunctiva clear no eyelid edema noted. pupils equal round and reactive to light and accommodation. Ears, Nose, Mouth, and Throat no gross abnormality of ear auricles or external auditory canals. normal hearing noted during conversation. mucus membranes moist. Respiratory normal breathing without difficulty. Cardiovascular 2+ dorsalis pedis/posterior tibialis pulses. no clubbing, cyanosis, significant edema, <3 sec cap  refill. Musculoskeletal normal gait and posture. no significant deformity or arthritic changes, no loss or range of motion, no clubbing. Psychiatric this patient is able to make decisions and demonstrates good insight into disease process. Alert and Oriented x 3. pleasant and cooperative. Notes Upon inspection  patient's wound bed actually showed signs of being very small as far as the wound openings are concerned. I do not see any major issues here and I really think that with appropriate wound care and the right dressing that hopefully we get this to heal and seal up pretty quickly. With that being said I discussed with the patient that I do believe keeping this covered at all times would be best I do not believe she needs to give this "air". In fact that is not really an appropriate way to get a wound to heal. We need to manage the moisture control making sure it is not too wet but also but is not too dry this is the optimal wound healing environment. With that being said I do believe that collagen would likely be the best thing for her as well. We will see how things do in this regard. Electronic Signature(s) Signed: 05/26/2020 3:46:38 PM By: Worthy Keeler PA-C Entered By: Worthy Keeler on 05/26/2020 15:46:37 -------------------------------------------------------------------------------- Physician Orders Details Patient Name: Date of Service: V ISI, BO [name] 05/26/2020 10:30 A M Medical Record Number: [id number] Patient Account Number: [id number] Date of Birth/Sex: Treating RN: 1956/02/08 (64 y.o. Elam Dutch Primary Care Provider: [name] Other Clinician: Referring Provider: Treating Provider/Extender: Ree Edman in Treatment: 0 Verbal / Phone Orders: No Diagnosis Coding ICD-10 Coding Code Description T81.31XA Disruption of external operation (surgical) wound, not elsewhere classified, initial encounter L97.822 Non-pressure  chronic ulcer of other part of left lower leg with fat layer exposed Follow-up Appointments Return Appointment in 1 week. Bathing/ Shower/ Hygiene May shower and wash wound with soap and water. Wound Treatment Wound #1 - Lower Leg Wound Laterality: Left, Medial, Proximal Prim Dressing: Promogran Prisma Matrix, 4.34 (sq in) (silver collagen) (DME) (Dispense As Written) Every Other Day/30 Days ary Discharge Instructions: Moisten collagen with saline or hydrogel, try to get collagen into the wound bed Secondary Dressing: Bordered Gauze, 4x4 in (DME) (Generic) Every Other Day/30 Days Discharge Instructions: Apply over primary dressing as directed. Wound #2 - Lower Leg Wound Laterality: Left, Medial, Distal Prim Dressing: Promogran Prisma Matrix, 4.34 (sq in) (silver collagen) (DME) (Dispense As Written) Every Other Day/30 Days ary Discharge Instructions: Moisten collagen with saline or hydrogel, try to get collagen into the wound bed Secondary Dressing: Bordered Gauze, 4x4 in (DME) (Generic) Every Other Day/30 Days Discharge Instructions: Apply over primary dressing as directed. Electronic Signature(s) Signed: 05/26/2020 4:01:55 PM By: Worthy Keeler PA-C Signed: 05/26/2020 6:12:31 PM By: Baruch Gouty RN, BSN Entered By: Baruch Gouty on 05/26/2020 12:24:14 -------------------------------------------------------------------------------- Problem List Details Patient Name: Date of Service: Lovenia Shuck, BO [name] 05/26/2020 10:30 A M Medical Record Number: [id number] Patient Account Number: [id number] Date of Birth/Sex: Treating RN: 12/15/1955 (64 y.o. Elam Dutch Primary Care Provider: [name] Other Clinician: Referring Provider: Treating Provider/Extender: Ree Edman in Treatment: 0 Active Problems ICD-10 Encounter Code Description Active Date MDM Diagnosis T81.31XA Disruption of external operation (surgical) wound, not elsewhere  classified, 05/26/2020 No Yes initial encounter L97.822 Non-pressure chronic ulcer of other part of left lower leg with fat layer exposed12/01/2020 No Yes Inactive Problems Resolved Problems Electronic Signature(s) Signed: 05/26/2020 12:10:53 PM By: Worthy Keeler PA-C Entered By: Worthy Keeler on 05/26/2020 12:10:52 -------------------------------------------------------------------------------- Progress Note Details Patient Name: Date of Service: V ISI, BO [name] 05/26/2020 10:30 A M Medical Record Number: [id number] Patient Account Number: [id number] Date of Birth/Sex: Treating  RN: Jun 27, 1955 (64 y.o. Elam Dutch Primary Care Provider: [name] Other Clinician: Referring Provider: Treating Provider/Extender: Ree Edman in Treatment: 0 Subjective Chief Complaint Information obtained from Patient Left LE surgical ulcers History of Present Illness (HPI) 05/26/2020 patient presents today for initial evaluation in our clinic concerning issues that she tells me began following the surgery to remove a premelanoma on her left lower extremity. She tells me that following this the sutures apparently did keep everything close initially although the wound dehisced following and subsequently she had what was termed an abscess underneath. She ended up being treated with antibiotic therapy. The original surgery was on September 8 for the excision. She then ended up after this dehisced having a culture which revealed Enterobacter and she had 2 rounds of Keflex followed by round of Bactrim. Following that she was given a prescription for mupirocin ointment to be used and then to leave this open to air. Subsequently the patient also states that she at 1 point was told to cover it by another provider. By different providers she was also told she should not cover it. Subsequently she also tells me that she has been told off and on by various providers to  or not to wear compression. She is really confused about what exactly to do to be honest. Fortunately she is not having too much pain except for when she has someone checking the wound like we are today otherwise she tells me the biggest issue is the frustration with the situation. Patient History Information obtained from Patient. Allergies No Known Drug Allergies Social History Never smoker, Marital Status - Married, Alcohol Use - Daily - one glass of wine per day, Caffeine Use - Moderate. Medical History Musculoskeletal Patient has history of Osteoarthritis Medical A Surgical History Notes nd Endocrine Hypothyroidism Review of Systems (ROS) Constitutional Symptoms (General Health) Denies complaints or symptoms of Fatigue, Fever, Chills, Marked Weight Change. Eyes Denies complaints or symptoms of Dry Eyes, Vision Changes, Glasses / Contacts. Ear/Nose/Mouth/Throat Denies complaints or symptoms of Chronic sinus problems or rhinitis. Respiratory Denies complaints or symptoms of Chronic or frequent coughs, Shortness of Breath. Cardiovascular Denies complaints or symptoms of Chest pain. Gastrointestinal Denies complaints or symptoms of Frequent diarrhea, Nausea, Vomiting. Endocrine Denies complaints or symptoms of Heat/cold intolerance. Genitourinary Denies complaints or symptoms of Frequent urination. Integumentary (Skin) Complains or has symptoms of Wounds - wounds on left lower leg. Psychiatric Denies complaints or symptoms of Claustrophobia, Suicidal. Objective Constitutional sitting or standing blood pressure is within target range for patient.. pulse regular and within target range for patient.Marland Kitchen respirations regular, non-labored and within target range for patient.Marland Kitchen temperature within target range for patient.. Well-nourished and well-hydrated in no acute distress. Vitals Time Taken: 11:39 AM, Height: 66 in, Source: Stated, Weight: 130 lbs, Source: Stated, BMI: 21,  Temperature: 97.8 F, Pulse: 73 bpm, Respiratory Rate: 18 breaths/min, Blood Pressure: 128/82 mmHg. Eyes conjunctiva clear no eyelid edema noted. pupils equal round and reactive to light and accommodation. Ears, Nose, Mouth, and Throat no gross abnormality of ear auricles or external auditory canals. normal hearing noted during conversation. mucus membranes moist. Respiratory normal breathing without difficulty. Cardiovascular 2+ dorsalis pedis/posterior tibialis pulses. no clubbing, cyanosis, significant edema, Musculoskeletal normal gait and posture. no significant deformity or arthritic changes, no loss or range of motion, no clubbing. Psychiatric this patient is able to make decisions and demonstrates good insight into disease process. Alert and Oriented x 3. pleasant and cooperative. General Notes:  Upon inspection patient's wound bed actually showed signs of being very small as far as the wound openings are concerned. I do not see any major issues here and I really think that with appropriate wound care and the right dressing that hopefully we get this to heal and seal up pretty quickly. With that being said I discussed with the patient that I do believe keeping this covered at all times would be best I do not believe she needs to give this "air". In fact that is not really an appropriate way to get a wound to heal. We need to manage the moisture control making sure it is not too wet but also but is not too dry this is the optimal wound healing environment. With that being said I do believe that collagen would likely be the best thing for her as well. We will see how things do in this regard. Integumentary (Hair, Skin) Wound #1 status is Open. Original cause of wound was Surgical Injury. The wound is located on the Left,Proximal,Medial Lower Leg. The wound measures 0.6cm length x 0.5cm width x 0.2cm depth; 0.236cm^2 area and 0.047cm^3 volume. There is Fat Layer (Subcutaneous Tissue)  exposed. There is no undermining noted, however, there is tunneling at 6:00 with a maximum distance of 0.8cm. There is a small amount of serosanguineous drainage noted. The wound margin is well defined and not attached to the wound base. There is large (67-100%) pink granulation within the wound bed. There is no necrotic tissue within the wound bed. Wound #2 status is Open. Original cause of wound was Surgical Injury. The wound is located on the Left,Distal,Medial Lower Leg. The wound measures 0.5cm length x 0.2cm width x 0.2cm depth; 0.079cm^2 area and 0.016cm^3 volume. There is Fat Layer (Subcutaneous Tissue) exposed. There is no undermining noted, however, there is tunneling at 11:00 with a maximum distance of 0.7cm. There is a small amount of serosanguineous drainage noted. The wound margin is well defined and not attached to the wound base. There is large (67-100%) pink granulation within the wound bed. There is no necrotic tissue within the wound bed. Assessment Active Problems ICD-10 Disruption of external operation (surgical) wound, not elsewhere classified, initial encounter Non-pressure chronic ulcer of other part of left lower leg with fat layer exposed Plan Follow-up Appointments: Return Appointment in 1 week. Bathing/ Shower/ Hygiene: May shower and wash wound with soap and water. WOUND #1: - Lower Leg Wound Laterality: Left, Medial, Proximal Prim Dressing: Promogran Prisma Matrix, 4.34 (sq in) (silver collagen) (DME) (Dispense As Written) Every Other Day/30 Days ary Discharge Instructions: Moisten collagen with saline or hydrogel, try to get collagen into the wound bed Secondary Dressing: Bordered Gauze, 4x4 in (DME) (Generic) Every Other Day/30 Days Discharge Instructions: Apply over primary dressing as directed. WOUND #2: - Lower Leg Wound Laterality: Left, Medial, Distal Prim Dressing: Promogran Prisma Matrix, 4.34 (sq in) (silver collagen) (DME) (Dispense As Written) Every  Other Day/30 Days ary Discharge Instructions: Moisten collagen with saline or hydrogel, try to get collagen into the wound bed Secondary Dressing: Bordered Gauze, 4x4 in (DME) (Generic) Every Other Day/30 Days Discharge Instructions: Apply over primary dressing as directed. 1. I would recommend currently that we actually go ahead and initiate treatment with a collagen-based dressing I think that is the best way to go currently. 2. I am also can recommend at this time that we have the patient use saline to moisten the collagen and then try to pack as much as she  can into the small hole areas again she not been able to get all the way down to the tonsils. Fortunately the tongue was not too deep. 3. Muscle can recommend at this time that the patient continue to keep this covered really at all times except when she is bathing I would recommend that she not take an actual bath but rather a shower. 4. I would also recommend that she really does not need much in the way of compression her legs really not swelling I do not think that is necessary is probably just more of an annoyance for her at this point. We will see patient back for reevaluation in 1 week here in the clinic. If anything worsens or changes patient will contact our office for additional recommendations. Electronic Signature(s) Signed: 05/26/2020 3:47:29 PM By: Worthy Keeler PA-C Entered By: Worthy Keeler on 05/26/2020 15:47:29 -------------------------------------------------------------------------------- HxROS Details Patient Name: Date of Service: V ISI, BO [name] 05/26/2020 10:30 A M Medical Record Number: [id number] Patient Account Number: [id number] Date of Birth/Sex: Treating RN: 1956-04-18 (64 y.o. [name] Primary Care Provider: [name] Other Clinician: Referring Provider: Treating Provider/Extender: Ree Edman in Treatment: 0 Information Obtained  From Patient Constitutional Symptoms (General Health) Complaints and Symptoms: Negative for: Fatigue; Fever; Chills; Marked Weight Change Eyes Complaints and Symptoms: Negative for: Dry Eyes; Vision Changes; Glasses / Contacts Ear/Nose/Mouth/Throat Complaints and Symptoms: Negative for: Chronic sinus problems or rhinitis Respiratory Complaints and Symptoms: Negative for: Chronic or frequent coughs; Shortness of Breath Cardiovascular Complaints and Symptoms: Negative for: Chest pain Gastrointestinal Complaints and Symptoms: Negative for: Frequent diarrhea; Nausea; Vomiting Endocrine Complaints and Symptoms: Negative for: Heat/cold intolerance Medical History: Past Medical History Notes: Hypothyroidism Genitourinary Complaints and Symptoms: Negative for: Frequent urination Integumentary (Skin) Complaints and Symptoms: Positive for: Wounds - wounds on left lower leg Psychiatric Complaints and Symptoms: Negative for: Claustrophobia; Suicidal Hematologic/Lymphatic Immunological Musculoskeletal Medical History: Positive for: Osteoarthritis Oncologic Immunizations Pneumococcal Vaccine: Received Pneumococcal Vaccination: No Implantable Devices None Family and Social History Never smoker; Marital Status - Married; Alcohol Use: Daily - one glass of wine per day; Caffeine Use: Moderate; Financial Concerns: No; Food, Clothing or Shelter Needs: No; Support System Lacking: No; Transportation Concerns: No Electronic Signature(s) Signed: 05/26/2020 4:01:55 PM By: Worthy Keeler PA-C Signed: 05/27/2020 4:05:55 PM By: Levan Hurst RN, BSN Entered By: Levan Hurst on 05/26/2020 11:43:21 -------------------------------------------------------------------------------- [place] Details Patient Name: Date of Service: V ISI, BO [name] 05/26/2020 Medical Record Number: [id number] Patient Account Number: [id number] Date of Birth/Sex: Treating RN: 1955-09-23 (64 y.o. Elam Dutch Primary Care Provider: [name] Other Clinician: Referring Provider: Treating Provider/Extender: Ree Edman in Treatment: 0 Diagnosis Coding ICD-10 Codes Code Description T81.31XA Disruption of external operation (surgical) wound, not elsewhere classified, initial encounter L97.822 Non-pressure chronic ulcer of other part of left lower leg with fat layer exposed Facility Procedures CPT4 Code: [id number] Description: [phone] - WOUND CARE VISIT-LEV 5 EST PT Modifier: Quantity: 1 Physician Procedures : CPT4 Code Description Modifier [id number] WC PHYS LEVEL 3 NEW PT ICD-10 Diagnosis Description T81.31XA Disruption of external operation (surgical) wound, not elsewhere classified, initial encounter L97.822 Non-pressure chronic ulcer of other part of left  lower leg with fat layer exposed Quantity: 1 Electronic Signature(s) Signed: 05/26/2020 3:47:42 PM By: Worthy Keeler PA-C Entered By: Worthy Keeler on 05/26/2020 15:47:41

## 2020-05-28 DIAGNOSIS — T8131XA Disruption of external operation (surgical) wound, not elsewhere classified, initial encounter: Secondary | ICD-10-CM | POA: Diagnosis not present

## 2020-05-31 ENCOUNTER — Telehealth: Payer: Self-pay | Admitting: Family Medicine

## 2020-05-31 DIAGNOSIS — T8131XA Disruption of external operation (surgical) wound, not elsewhere classified, initial encounter: Secondary | ICD-10-CM | POA: Diagnosis not present

## 2020-05-31 NOTE — Telephone Encounter (Signed)
Marletta Lor from the [facility] left a detailed report of patient's results for the patient's PCP, said it is more of a detailed view report than the one on epic. Results left in provider box. AM

## 2020-06-01 DIAGNOSIS — M25572 Pain in left ankle and joints of left foot: Secondary | ICD-10-CM | POA: Diagnosis not present

## 2020-06-02 ENCOUNTER — Other Ambulatory Visit: Payer: Self-pay

## 2020-06-02 ENCOUNTER — Encounter (HOSPITAL_BASED_OUTPATIENT_CLINIC_OR_DEPARTMENT_OTHER): Payer: PPO | Admitting: Physician Assistant

## 2020-06-02 DIAGNOSIS — T8131XA Disruption of external operation (surgical) wound, not elsewhere classified, initial encounter: Secondary | ICD-10-CM | POA: Diagnosis not present

## 2020-06-02 NOTE — Progress Notes (Addendum)
JAHNESSA, VANDUYN ([id number]) Visit Report for 06/02/2020 Chief Complaint Document Details Patient Name: Date of Service: V ISI, [name] [name] 06/02/2020 9:45 A M Medical Record Number: [id number] Patient Account Number: [id number] Date of Birth/Sex: Treating RN: [birth year] (64 y.o. Elam Dutch Primary Care Provider: [name] Other Clinician: Referring Provider: Treating Provider/Extender: Ree Edman in Treatment: 1 Information Obtained from: Patient Chief Complaint Left LE surgical ulcers Electronic Signature(s) Signed: 06/02/2020 9:55:02 AM By: Worthy Keeler PA-C Entered By: Worthy Keeler on 06/02/2020 09:55:01 -------------------------------------------------------------------------------- HPI Details Patient Name: Date of Service: V ISI, BO [name] 06/02/2020 9:45 A M Medical Record Number: [id number] Patient Account Number: [id number] Date of Birth/Sex: Treating RN: 08-May-1956 (64 y.o. Elam Dutch Primary Care Provider: [name] Other Clinician: Referring Provider: Treating Provider/Extender: Ree Edman in Treatment: 1 History of Present Illness HPI Description: 05/26/2020 patient presents today for initial evaluation in our clinic concerning issues that she tells me began following the surgery to remove a premelanoma on her left lower extremity. She tells me that following this the sutures apparently did keep everything close initially although the wound dehisced following and subsequently she had what was termed an abscess underneath. She ended up being treated with antibiotic therapy. The original surgery was on September 8 for the excision. She then ended up after this dehisced having a culture which revealed Enterobacter and she had 2 rounds of Keflex followed by round of Bactrim. Following that she was given a prescription for mupirocin ointment to be used and then to leave this  open to air. Subsequently the patient also states that she at 1 point was told to cover it by another provider. By different providers she was also told she should not cover it. Subsequently she also tells me that she has been told off and on by various providers to or not to wear compression. She is really confused about what exactly to do to be honest. Fortunately she is not having too much pain except for when she has someone checking the wound like we are today otherwise she tells me the biggest issue is the frustration with the situation. 06/02/2020 upon evaluation today patient appears to be doing well with regard to her wound. In fact this appears to show signs of excellent improvement which is great news. No fevers, chills, nausea, vomiting, or diarrhea. Electronic Signature(s) Signed: 06/02/2020 11:48:29 AM By: Worthy Keeler PA-C Entered By: Worthy Keeler on 06/02/2020 11:48:28 -------------------------------------------------------------------------------- Physical Exam Details Patient Name: Date of Service: V ISI, BO [name] 06/02/2020 9:45 A M Medical Record Number: [id number] Patient Account Number: [id number] Date of Birth/Sex: Treating RN: 1956/05/20 (64 y.o. Elam Dutch Primary Care Provider: [name] Other Clinician: Referring Provider: Treating Provider/Extender: Ree Edman in Treatment: 1 Constitutional Well-nourished and well-hydrated in no acute distress. Respiratory normal breathing without difficulty. Psychiatric this patient is able to make decisions and demonstrates good insight into disease process. Alert and Oriented x 3. pleasant and cooperative. Notes His wound bed actually showed signs of good granulation epithelization at this point. There does not appear to be any evidence of active infection which is great news and overall I am extremely pleased with where things stand today Electronic  Signature(s) Signed: 06/02/2020 11:48:43 AM By: Worthy Keeler PA-C Entered By: Worthy Keeler on 06/02/2020 11:48:43 -------------------------------------------------------------------------------- Physician Orders Details Patient Name: Date of Service: V ISI, BO [name] 06/02/2020  9:45 A M Medical Record Number: [id number] Patient Account Number: [id number] Date of Birth/Sex: Treating RN: [birth year] (64 y.o. Elam Dutch Primary Care Provider: [name] Other Clinician: Referring Provider: Treating Provider/Extender: Ree Edman in Treatment: 1 Verbal / Phone Orders: No Diagnosis Coding ICD-10 Coding Code Description T81.31XA Disruption of external operation (surgical) wound, not elsewhere classified, initial encounter L97.822 Non-pressure chronic ulcer of other part of left lower leg with fat layer exposed Follow-up Appointments Return appointment in 3 weeks. Bathing/ Shower/ Hygiene May shower and wash wound with soap and water. Wound Treatment Wound #1 - Lower Leg Wound Laterality: Left, Medial, Proximal Prim Dressing: Promogran Prisma Matrix, 4.34 (sq in) (silver collagen) (Dispense As Written) Every Other Day/30 Days ary Discharge Instructions: Moisten collagen with saline or hydrogel, try to get collagen into the wound bed Secondary Dressing: Bordered Gauze, 4x4 in (Generic) Every Other Day/30 Days Discharge Instructions: Apply over primary dressing as directed. Wound #2 - Lower Leg Wound Laterality: Left, Medial, Distal Prim Dressing: Promogran Prisma Matrix, 4.34 (sq in) (silver collagen) (Dispense As Written) Every Other Day/30 Days ary Discharge Instructions: Moisten collagen with saline or hydrogel, try to get collagen into the wound bed Secondary Dressing: Bordered Gauze, 4x4 in (Generic) Every Other Day/30 Days Discharge Instructions: Apply over primary dressing as directed. Electronic Signature(s) Signed: 06/02/2020  1:05:14 PM By: Worthy Keeler PA-C Signed: 06/02/2020 5:29:15 PM By: Baruch Gouty RN, BSN Entered By: Baruch Gouty on 06/02/2020 11:37:55 -------------------------------------------------------------------------------- Problem List Details Patient Name: Date of Service: Lovenia Shuck, BO [name] 06/02/2020 9:45 A M Medical Record Number: [id number] Patient Account Number: [id number] Date of Birth/Sex: Treating RN: 1956-02-26 (64 y.o. Elam Dutch Primary Care Provider: [name] Other Clinician: Referring Provider: Treating Provider/Extender: Ree Edman in Treatment: 1 Active Problems ICD-10 Encounter Code Description Active Date MDM Diagnosis T81.31XA Disruption of external operation (surgical) wound, not elsewhere classified, 05/26/2020 No Yes initial encounter L97.822 Non-pressure chronic ulcer of other part of left lower leg with fat layer exposed12/01/2020 No Yes Inactive Problems Resolved Problems Electronic Signature(s) Signed: 06/02/2020 9:54:56 AM By: Worthy Keeler PA-C Entered By: Worthy Keeler on 06/02/2020 09:54:55 -------------------------------------------------------------------------------- Progress Note Details Patient Name: Date of Service: V ISI, BO [name] 06/02/2020 9:45 A M Medical Record Number: [id number] Patient Account Number: [id number] Date of Birth/Sex: Treating RN: [birth year] (64 y.o. Elam Dutch Primary Care Provider: [name] Other Clinician: Referring Provider: Treating Provider/Extender: Ree Edman in Treatment: 1 Subjective Chief Complaint Information obtained from Patient Left LE surgical ulcers History of Present Illness (HPI) 05/26/2020 patient presents today for initial evaluation in our clinic concerning issues that she tells me began following the surgery to remove a premelanoma on her left lower extremity. She tells me that following  this the sutures apparently did keep everything close initially although the wound dehisced following and subsequently she had what was termed an abscess underneath. She ended up being treated with antibiotic therapy. The original surgery was on September 8 for the excision. She then ended up after this dehisced having a culture which revealed Enterobacter and she had 2 rounds of Keflex followed by round of Bactrim. Following that she was given a prescription for mupirocin ointment to be used and then to leave this open to air. Subsequently the patient also states that she at 1 point was told to cover it by another provider. By different providers she was also told she should  not cover it. Subsequently she also tells me that she has been told off and on by various providers to or not to wear compression. She is really confused about what exactly to do to be honest. Fortunately she is not having too much pain except for when she has someone checking the wound like we are today otherwise she tells me the biggest issue is the frustration with the situation. 06/02/2020 upon evaluation today patient appears to be doing well with regard to her wound. In fact this appears to show signs of excellent improvement which is great news. No fevers, chills, nausea, vomiting, or diarrhea. Objective Constitutional Well-nourished and well-hydrated in no acute distress. Vitals Time Taken: 10:49 AM, Height: 66 in, Weight: 130 lbs, BMI: 21, Temperature: 98.1 F, Pulse: 84 bpm, Respiratory Rate: 18 breaths/min, Blood Pressure: 124/81 mmHg. Respiratory normal breathing without difficulty. Psychiatric this patient is able to make decisions and demonstrates good insight into disease process. Alert and Oriented x 3. pleasant and cooperative. General Notes: His wound bed actually showed signs of good granulation epithelization at this point. There does not appear to be any evidence of active infection which is great news  and overall I am extremely pleased with where things stand today Integumentary (Hair, Skin) Wound #1 status is Open. Original cause of wound was Surgical Injury. The wound is located on the Left,Proximal,Medial Lower Leg. The wound measures 0.4cm length x 0.3cm width x 0.1cm depth; 0.094cm^2 area and 0.009cm^3 volume. There is Fat Layer (Subcutaneous Tissue) exposed. There is no tunneling or undermining noted. There is a small amount of serosanguineous drainage noted. The wound margin is well defined and not attached to the wound base. There is large (67-100%) pink granulation within the wound bed. There is no necrotic tissue within the wound bed. Wound #2 status is Open. Original cause of wound was Surgical Injury. The wound is located on the Left,Distal,Medial Lower Leg. The wound measures 0.2cm length x 0.2cm width x 0.1cm depth; 0.031cm^2 area and 0.003cm^3 volume. There is Fat Layer (Subcutaneous Tissue) exposed. There is no tunneling or undermining noted. There is a small amount of serosanguineous drainage noted. The wound margin is well defined and not attached to the wound base. There is large (67-100%) pink granulation within the wound bed. There is no necrotic tissue within the wound bed. Assessment Active Problems ICD-10 Disruption of external operation (surgical) wound, not elsewhere classified, initial encounter Non-pressure chronic ulcer of other part of left lower leg with fat layer exposed Plan Follow-up Appointments: Return appointment in 3 weeks. Bathing/ Shower/ Hygiene: May shower and wash wound with soap and water. WOUND #1: - Lower Leg Wound Laterality: Left, Medial, Proximal Prim Dressing: Promogran Prisma Matrix, 4.34 (sq in) (silver collagen) (Dispense As Written) Every Other Day/30 Days ary Discharge Instructions: Moisten collagen with saline or hydrogel, try to get collagen into the wound bed Secondary Dressing: Bordered Gauze, 4x4 in (Generic) Every Other Day/30  Days Discharge Instructions: Apply over primary dressing as directed. WOUND #2: - Lower Leg Wound Laterality: Left, Medial, Distal Prim Dressing: Promogran Prisma Matrix, 4.34 (sq in) (silver collagen) (Dispense As Written) Every Other Day/30 Days ary Discharge Instructions: Moisten collagen with saline or hydrogel, try to get collagen into the wound bed Secondary Dressing: Bordered Gauze, 4x4 in (Generic) Every Other Day/30 Days Discharge Instructions: Apply over primary dressing as directed. 1. Would recommend currently that we go ahead and continue with the collagen I think she is very close to complete resolution. We will  continue to keep an eye on this but overall I think that the patient needs to continue to change this every other day. 2. I am also can recommend she continue to monitor for any evidence of worsening infection. Obviously if she has any redness or erythema that ensues she should let me know soon as possible. We will see patient back for reevaluation in 3 weeks here in the clinic. If anything worsens or changes patient will contact our office for additional recommendations. Electronic Signature(s) Signed: 06/02/2020 11:49:20 AM By: Worthy Keeler PA-C Entered By: Worthy Keeler on 06/02/2020 11:49:20 -------------------------------------------------------------------------------- SuperBill Details Patient Name: Date of Service: V ISI, BO [name] 06/02/2020 Medical Record Number: [id number] Patient Account Number: [id number] Date of Birth/Sex: Treating RN: Dec 05, 1955 (64 y.o. Elam Dutch Primary Care Provider: [name] Other Clinician: Referring Provider: Treating Provider/Extender: Ree Edman in Treatment: 1 Diagnosis Coding ICD-10 Codes Code Description T81.31XA Disruption of external operation (surgical) wound, not elsewhere classified, initial encounter L97.822 Non-pressure chronic ulcer of other part of left  lower leg with fat layer exposed Facility Procedures CPT4 Code: [id number] Description: 99214 - WOUND CARE VISIT-LEV 4 EST PT Modifier: Quantity: 1 Physician Procedures : CPT4 Code Description Modifier [id number] - WC PHYS LEVEL 3 - EST PT ICD-10 Diagnosis Description T81.31XA Disruption of external operation (surgical) wound, not elsewhere classified, initial encounter L97.822 Non-pressure chronic ulcer of other  part of left lower leg with fat layer exposed Quantity: 1 Electronic Signature(s) Signed: 06/02/2020 11:49:30 AM By: Worthy Keeler PA-C Entered By: Worthy Keeler on 06/02/2020 11:49:29

## 2020-06-03 NOTE — Progress Notes (Signed)
BRE, PECINA ([id number]) Visit Report for 06/02/2020 Arrival Information Details Patient Name: Date of Service: Sheila Harris Sheila A. 06/02/2020 9:45 A M Medical Record Number: [id number] Patient Account Number: [id number] Date of Birth/Sex: Treating RN: 1956/03/31 (64 y.o. Martyn Malay, Linda Primary Care Glyn Gerads: [name] Other Clinician: Referring Kaitlyn Franko: Treating Jullien Granquist/Extender: Ree Edman in Treatment: 1 Visit Information History Since Last Visit Added or deleted any medications: No Patient Arrived: Ambulatory Any new allergies or adverse reactions: No Arrival Time: 10:48 Had a fall or experienced change in No Accompanied By: self activities of daily living that may affect Transfer Assistance: None risk of falls: Patient Identification Verified: Yes Signs or symptoms of abuse/neglect since last visito No Secondary Verification Process Completed: Yes Hospitalized since last visit: No Patient Requires Transmission-Based Precautions: No Implantable device outside of the clinic excluding No Patient Has Alerts: No cellular tissue based products placed in the center since last visit: Has Dressing in Place as Prescribed: Yes Pain Present Now: No Electronic Signature(s) Signed: 06/03/2020 7:52:14 AM By: Sandre Kitty Entered By: Sandre Kitty on 06/02/2020 10:49:06 -------------------------------------------------------------------------------- Clinic Level of Care Assessment Details Patient Name: Date of Service: Sheila Harris Sheila A. 06/02/2020 9:45 A M Medical Record Number: [id number] Patient Account Number: [id number] Date of Birth/Sex: Treating RN: [birth year] (64 y.o. Elam Dutch Primary Care Jontrell Bushong: [name] Other Clinician: Referring Jadan Rouillard: Treating Cathi Hazan/Extender: Ree Edman in Treatment: 1 Clinic Level of Care Assessment Items TOOL 4 Quantity Score []  - 0 Use  when only an EandM is performed on FOLLOW-UP visit ASSESSMENTS - Nursing Assessment / Reassessment X- 1 10 Reassessment of Co-morbidities (includes updates in patient status) X- 1 5 Reassessment of Adherence to Treatment Plan ASSESSMENTS - Wound and Skin A ssessment / Reassessment []  - 0 Simple Wound Assessment / Reassessment - one wound X- 2 5 Complex Wound Assessment / Reassessment - multiple wounds []  - 0 Dermatologic / Skin Assessment (not related to wound area) ASSESSMENTS - Focused Assessment []  - 0 Circumferential Edema Measurements - multi extremities []  - 0 Nutritional Assessment / Counseling / Intervention X- 1 5 Lower Extremity Assessment (monofilament, tuning fork, pulses) []  - 0 Peripheral Arterial Disease Assessment (using hand held doppler) ASSESSMENTS - Ostomy and/or Continence Assessment and Care []  - 0 Incontinence Assessment and Management []  - 0 Ostomy Care Assessment and Management (repouching, etc.) PROCESS - Coordination of Care X - Simple Patient / Family Education for ongoing care 1 15 []  - 0 Complex (extensive) Patient / Family Education for ongoing care X- 1 10 Staff obtains Programmer, systems, Records, T Results / Process Orders est []  - 0 Staff telephones HHA, Nursing Homes / Clarify orders / etc []  - 0 Routine Transfer to another Facility (non-emergent condition) []  - 0 Routine Hospital Admission (non-emergent condition) []  - 0 New Admissions / Biomedical engineer / Ordering NPWT Apligraf, etc. , []  - 0 Emergency Hospital Admission (emergent condition) []  - 0 Simple Discharge Coordination X- 1 15 Complex (extensive) Discharge Coordination PROCESS - Special Needs []  - 0 Pediatric / Minor Patient Management []  - 0 Isolation Patient Management []  - 0 Hearing / Language / Visual special needs []  - 0 Assessment of Community assistance (transportation, D/C planning, etc.) []  - 0 Additional assistance / Altered mentation []  - 0 Support  Surface(s) Assessment (bed, cushion, seat, etc.) INTERVENTIONS - Wound Cleansing / Measurement []  - 0 Simple Wound Cleansing - one wound X- 2 5 Complex Wound Cleansing -  multiple wounds X- 1 5 Wound Imaging (photographs - any number of wounds) []  - 0 Wound Tracing (instead of photographs) []  - 0 Simple Wound Measurement - one wound X- 2 5 Complex Wound Measurement - multiple wounds INTERVENTIONS - Wound Dressings X - Small Wound Dressing one or multiple wounds 2 10 []  - 0 Medium Wound Dressing one or multiple wounds []  - 0 Large Wound Dressing one or multiple wounds X- 1 5 Application of Medications - topical []  - 0 Application of Medications - injection INTERVENTIONS - Miscellaneous []  - 0 External ear exam []  - 0 Specimen Collection (cultures, biopsies, blood, body fluids, etc.) []  - 0 Specimen(s) / Culture(s) sent or taken to Lab for analysis []  - 0 Patient Transfer (multiple staff / Civil Service fast streamer / Similar devices) []  - 0 Simple Staple / Suture removal (25 or less) []  - 0 Complex Staple / Suture removal (26 or more) []  - 0 Hypo / Hyperglycemic Management (close monitor of Blood Glucose) []  - 0 Ankle / Brachial Index (ABI) - do not check if billed separately X- 1 5 Vital Signs Has the patient been seen at the hospital within the last three years: Yes Total Score: 125 Level Of Care: New/Established - Level 4 Electronic Signature(s) Signed: 06/02/2020 5:29:15 PM By: Baruch Gouty RN, BSN Entered By: Baruch Gouty on 06/02/2020 11:36:26 -------------------------------------------------------------------------------- Encounter Discharge Information Details Patient Name: Date of Service: Sheila Harris, Sheila Sheila A. 06/02/2020 9:45 A M Medical Record Number: [id number] Patient Account Number: [id number] Date of Birth/Sex: Treating RN: March 22, 1956 (64 y.o. Orvan Falconer Primary Care Corneisha Alvi: [name] Other Clinician: Referring Skiler Olden: Treating  Lenardo Westwood/Extender: Ree Edman in Treatment: 1 Encounter Discharge Information Items Discharge Condition: Stable Ambulatory Status: Ambulatory Discharge Destination: Home Transportation: Private Auto Accompanied By: self Schedule Follow-up Appointment: Yes Clinical Summary of Care: Patient Declined Electronic Signature(s) Signed: 06/02/2020 5:05:31 PM By: Carlene Coria RN Entered By: Carlene Coria on 06/02/2020 11:47:35 -------------------------------------------------------------------------------- Lower Extremity Assessment Details Patient Name: Date of Service: Sheila Harris Sheila A. 06/02/2020 9:45 A M Medical Record Number: [id number] Patient Account Number: [id number] Date of Birth/Sex: Treating RN: 14-Mar-1956 (64 y.o. Orvan Falconer Primary Care Cadience Bradfield: [name] Other Clinician: Referring Shakisha Abend: Treating Nikolai Wilczak/Extender: Ree Edman in Treatment: 1 Edema Assessment Assessed: [Left: No] [Right: No] Edema: [Left: N] [Right: o] Calf Left: Right: Point of Measurement: 30 cm From Medial Instep 33 cm Ankle Left: Right: Point of Measurement: 10 cm From Medial Instep 19 cm Electronic Signature(s) Signed: 06/02/2020 5:05:31 PM By: Carlene Coria RN Entered By: Carlene Coria on 06/02/2020 10:53:28 -------------------------------------------------------------------------------- Multi-Disciplinary Care Plan Details Patient Name: Date of Service: Sheila Harris, Sheila Sheila A. 06/02/2020 9:45 A M Medical Record Number: [id number] Patient Account Number: [id number] Date of Birth/Sex: Treating RN: 1956-03-20 (64 y.o. Elam Dutch Primary Care Alynna Hargrove: [name] Other Clinician: Referring Ann Bohne: Treating Keelyn Monjaras/Extender: Ree Edman in Treatment: 1 Active Inactive Wound/Skin Impairment Nursing Diagnoses: Impaired tissue integrity Knowledge deficit related to  ulceration/compromised skin integrity Goals: Patient/caregiver will verbalize understanding of skin care regimen Date Initiated: 05/26/2020 Target Resolution Date: 06/23/2020 Goal Status: Active Ulcer/skin breakdown will have a volume reduction of 30% by week 4 Date Initiated: 05/26/2020 Target Resolution Date: 06/23/2020 Goal Status: Active Interventions: Assess patient/caregiver ability to obtain necessary supplies Assess patient/caregiver ability to perform ulcer/skin care regimen upon admission and as needed Assess ulceration(s) every visit Treatment Activities: Skin care regimen initiated : 05/26/2020 Topical  wound management initiated : 05/26/2020 Notes: Electronic Signature(s) Signed: 06/02/2020 5:29:15 PM By: Baruch Gouty RN, BSN Entered By: Baruch Gouty on 06/02/2020 11:35:25 -------------------------------------------------------------------------------- Pain Assessment Details Patient Name: Date of Service: Sheila Harris, Sheila Sheila A. 06/02/2020 9:45 A M Medical Record Number: [id number] Patient Account Number: [id number] Date of Birth/Sex: Treating RN: October 17, 1955 (64 y.o. Elam Dutch Primary Care Arvie Bartholomew: [name] Other Clinician: Referring Madelyn Tlatelpa: Treating Constancia Geeting/Extender: Ree Edman in Treatment: 1 Active Problems Location of Pain Severity and Description of Pain Patient Has Paino No Site Locations Pain Management and Medication Current Pain Management: Electronic Signature(s) Signed: 06/02/2020 5:29:15 PM By: Baruch Gouty RN, BSN Signed: 06/03/2020 7:52:14 AM By: Sandre Kitty Entered By: Sandre Kitty on 06/02/2020 10:49:26 -------------------------------------------------------------------------------- Patient/Caregiver Education Details Patient Name: Date of Service: Sheila Harris, Sheila Sheila A. 12/15/2021andnbsp9:45 A M Medical Record Number: [id number] Patient Account Number: [id number] Date of  Birth/Gender: Treating RN: 04-16-1956 (64 y.o. Elam Dutch Primary Care Physician: [name] Other Clinician: Referring Physician: Treating Physician/Extender: Ree Edman in Treatment: 1 Education Assessment Education Provided To: Patient Education Topics Provided Wound/Skin Impairment: Methods: Explain/Verbal Responses: Reinforcements needed, State content correctly Electronic Signature(s) Signed: 06/02/2020 5:29:15 PM By: Baruch Gouty RN, BSN Entered By: Baruch Gouty on 06/02/2020 11:35:42 -------------------------------------------------------------------------------- Wound Assessment Details Patient Name: Date of Service: Sheila Harris, Sheila Sheila A. 06/02/2020 9:45 A M Medical Record Number: [id number] Patient Account Number: [id number] Date of Birth/Sex: Treating RN: 1955/06/29 (64 y.o. Orvan Falconer Primary Care Harjas Biggins: [name] Other Clinician: Referring Yachet Mattson: Treating Laryah Neuser/Extender: Ree Edman in Treatment: 1 Wound Status Wound Number: 1 Primary Etiology: Open Surgical Wound Wound Location: Left, Proximal, Medial Lower Leg Wound Status: Open Wounding Event: Surgical Injury Comorbid History: Osteoarthritis Date Acquired: 02/25/2020 Weeks Of Treatment: 1 Clustered Wound: No Wound Measurements Length: (cm) 0.4 Width: (cm) 0.3 Depth: (cm) 0.1 Area: (cm) 0.094 Volume: (cm) 0.009 % Reduction in Area: 60.2% % Reduction in Volume: 80.9% Epithelialization: None Tunneling: No Undermining: No Wound Description Classification: Full Thickness Without Exposed Support Structures Wound Margin: Well defined, not attached Exudate Amount: Small Exudate Type: Serosanguineous Exudate Color: red, brown Foul Odor After Cleansing: No Slough/Fibrino No Wound Bed Granulation Amount: Large (67-100%) Exposed Structure Granulation Quality: Pink Fascia Exposed: No Necrotic  Amount: None Present (0%) Fat Layer (Subcutaneous Tissue) Exposed: Yes Tendon Exposed: No Muscle Exposed: No Joint Exposed: No Bone Exposed: No Treatment Notes Wound #1 (Lower Leg) Wound Laterality: Left, Medial, Proximal Cleanser Peri-Wound Care Topical Primary Dressing Promogran Prisma Matrix, 4.34 (sq in) (silver collagen) Discharge Instruction: Moisten collagen with saline or hydrogel, try to get collagen into the wound bed Secondary Dressing Bordered Gauze, 4x4 in Discharge Instruction: Apply over primary dressing as directed. Secured With Compression Wrap Compression Stockings Environmental education officer) Signed: 06/02/2020 5:05:31 PM By: Carlene Coria RN Entered By: Carlene Coria on 06/02/2020 10:53:55 -------------------------------------------------------------------------------- Wound Assessment Details Patient Name: Date of Service: Sheila Harris, Sheila Sheila A. 06/02/2020 9:45 A M Medical Record Number: [id number] Patient Account Number: [id number] Date of Birth/Sex: Treating RN: [birth year] (64 y.o. Orvan Falconer Primary Care Rashika Bettes: Other Clinician: [name] Referring Jonai Weyland: Treating Chrishawna Farina/Extender: Ree Edman in Treatment: 1 Wound Status Wound Number: 2 Primary Etiology: Open Surgical Wound Wound Location: Left, Distal, Medial Lower Leg Wound Status: Open Wounding Event: Surgical Injury Comorbid History: Osteoarthritis Date Acquired: 02/25/2020 Weeks Of Treatment: 1 Clustered Wound: No Wound Measurements Length: (cm) 0.2 Width: (cm)  0.2 Depth: (cm) 0.1 Area: (cm) 0.031 Volume: (cm) 0.003 % Reduction in Area: 60.8% % Reduction in Volume: 81.3% Epithelialization: None Tunneling: No Undermining: No Wound Description Classification: Full Thickness Without Exposed Support Structures Wound Margin: Well defined, not attached Exudate Amount: Small Exudate Type: Serosanguineous Exudate Color: red,  brown Foul Odor After Cleansing: No Slough/Fibrino No Wound Bed Granulation Amount: Large (67-100%) Exposed Structure Granulation Quality: Pink Fascia Exposed: No Necrotic Amount: None Present (0%) Fat Layer (Subcutaneous Tissue) Exposed: Yes Tendon Exposed: No Muscle Exposed: No Joint Exposed: No Bone Exposed: No Treatment Notes Wound #2 (Lower Leg) Wound Laterality: Left, Medial, Distal Cleanser Peri-Wound Care Topical Primary Dressing Promogran Prisma Matrix, 4.34 (sq in) (silver collagen) Discharge Instruction: Moisten collagen with saline or hydrogel, try to get collagen into the wound bed Secondary Dressing Bordered Gauze, 4x4 in Discharge Instruction: Apply over primary dressing as directed. Secured With Compression Wrap Compression Stockings Environmental education officer) Signed: 06/02/2020 5:05:31 PM By: Carlene Coria RN Entered By: Carlene Coria on 06/02/2020 10:54:03 -------------------------------------------------------------------------------- Vitals Details Patient Name: Date of Service: Sheila Harris, Sheila Sheila A. 06/02/2020 9:45 A M Medical Record Number: [id number] Patient Account Number: [id number] Date of Birth/Sex: Treating RN: 10/26/1955 (64 y.o. Elam Dutch Primary Care Shaely Gadberry: [name] Other Clinician: Referring Madelynn Malson: Treating Coraleigh Sheeran/Extender: Ree Edman in Treatment: 1 Vital Signs Time Taken: 10:49 Temperature (F): 98.1 Height (in): 66 Pulse (bpm): 84 Weight (lbs): 130 Respiratory Rate (breaths/min): 18 Body Mass Index (BMI): 21 Blood Pressure (mmHg): 124/81 Reference Range: 80 - 120 mg / dl Electronic Signature(s) Signed: 06/03/2020 7:52:14 AM By: Sandre Kitty Entered By: Sandre Kitty on 06/02/2020 10:49:20

## 2020-06-07 ENCOUNTER — Other Ambulatory Visit: Payer: Self-pay

## 2020-06-07 ENCOUNTER — Ambulatory Visit (INDEPENDENT_AMBULATORY_CARE_PROVIDER_SITE_OTHER): Payer: PPO | Admitting: Sports Medicine

## 2020-06-07 DIAGNOSIS — L84 Corns and callosities: Secondary | ICD-10-CM

## 2020-06-07 MED ORDER — TRIAMCINOLONE ACETONIDE 0.5 % EX OINT: 1.0000 "application " | TOPICAL_OINTMENT | Freq: Two times a day (BID) | CUTANEOUS | 3 refills | Status: DC

## 2020-06-07 NOTE — Progress Notes (Signed)
    Procedures performed today:    Procedure: Trimming of hyperkeratotic callus, right foot 4/5 interspace Risks, benefits, and alternatives explained and consent obtained. Time out conducted. Surface prepped with alcohol. Adequate anesthesia ensured. Area prepped and draped in a sterile fashion. Excision performed with: Using a derma blade I trimmed the excess callus staying in the insensate skin. Pt stable.  Independent interpretation of notes and tests performed by another provider:   None.  Brief History, Exam, Impression, and Recommendations:    Corn of foot right, 4/5 interspace Sheila Harris is a pleasant 63 year old female, she does not work, she has had some discomfort between her fourth/fifth interspace on the bottom of her right foot, on exam she has some thick callus and the classic crack between the callus as well as a corn, callus was trimmed down today, dressed, we will do triamcinolone ointment 0.5% twice daily and she can return to see me in 2 weeks.    ___________________________________________ Gwen Her. Dianah Field, M.D., ABFM., CAQSM. Primary Care and [place] In[employer] of [employer] [facility]

## 2020-06-07 NOTE — Assessment & Plan Note (Signed)
Sheila Harris is a pleasant 64 year old female, she does not work, she has had some discomfort between her fourth/fifth interspace on the bottom of her right foot, on exam she has some thick callus and the classic crack between the callus as well as a corn, callus was trimmed down today, dressed, we will do triamcinolone ointment 0.5% twice daily and she can return to see me in 2 weeks.

## 2020-06-08 DIAGNOSIS — M25572 Pain in left ankle and joints of left foot: Secondary | ICD-10-CM | POA: Diagnosis not present

## 2020-06-09 DIAGNOSIS — M25572 Pain in left ankle and joints of left foot: Secondary | ICD-10-CM | POA: Diagnosis not present

## 2020-06-15 DIAGNOSIS — Z20822 Contact with and (suspected) exposure to covid-19: Secondary | ICD-10-CM | POA: Diagnosis not present

## 2020-06-20 DIAGNOSIS — U071 COVID-19: Secondary | ICD-10-CM | POA: Diagnosis not present

## 2020-06-20 DIAGNOSIS — R059 Cough, unspecified: Secondary | ICD-10-CM | POA: Diagnosis not present

## 2020-06-21 ENCOUNTER — Ambulatory Visit: Payer: PPO | Admitting: Sports Medicine

## 2020-06-22 ENCOUNTER — Encounter (HOSPITAL_BASED_OUTPATIENT_CLINIC_OR_DEPARTMENT_OTHER): Payer: PPO | Admitting: Internal Medicine

## 2020-06-30 ENCOUNTER — Encounter (HOSPITAL_BASED_OUTPATIENT_CLINIC_OR_DEPARTMENT_OTHER): Payer: PPO | Admitting: Physician Assistant

## 2020-06-30 DIAGNOSIS — M9904 Segmental and somatic dysfunction of sacral region: Secondary | ICD-10-CM | POA: Diagnosis not present

## 2020-06-30 DIAGNOSIS — M5136 Other intervertebral disc degeneration, lumbar region: Secondary | ICD-10-CM | POA: Diagnosis not present

## 2020-06-30 DIAGNOSIS — M5032 Other cervical disc degeneration, mid-cervical region, unspecified level: Secondary | ICD-10-CM | POA: Diagnosis not present

## 2020-06-30 DIAGNOSIS — M9901 Segmental and somatic dysfunction of cervical region: Secondary | ICD-10-CM | POA: Diagnosis not present

## 2020-06-30 DIAGNOSIS — M5137 Other intervertebral disc degeneration, lumbosacral region: Secondary | ICD-10-CM | POA: Diagnosis not present

## 2020-06-30 DIAGNOSIS — M9903 Segmental and somatic dysfunction of lumbar region: Secondary | ICD-10-CM | POA: Diagnosis not present

## 2020-07-08 ENCOUNTER — Encounter: Payer: Self-pay | Admitting: Family Medicine

## 2020-07-08 ENCOUNTER — Ambulatory Visit (INDEPENDENT_AMBULATORY_CARE_PROVIDER_SITE_OTHER): Payer: PPO | Admitting: Family Medicine

## 2020-07-08 ENCOUNTER — Other Ambulatory Visit: Payer: Self-pay

## 2020-07-08 VITALS — BP 110/64 | HR 77 | Ht 66.0 in | Wt 130.0 lb

## 2020-07-08 DIAGNOSIS — Z13220 Encounter for screening for lipoid disorders: Secondary | ICD-10-CM

## 2020-07-08 DIAGNOSIS — Z79890 Hormone replacement therapy (postmenopausal): Secondary | ICD-10-CM

## 2020-07-08 DIAGNOSIS — E039 Hypothyroidism, unspecified: Secondary | ICD-10-CM

## 2020-07-08 DIAGNOSIS — Z0000 Encounter for general adult medical examination without abnormal findings: Secondary | ICD-10-CM | POA: Diagnosis not present

## 2020-07-08 DIAGNOSIS — E611 Iron deficiency: Secondary | ICD-10-CM | POA: Diagnosis not present

## 2020-07-08 NOTE — Progress Notes (Signed)
Established Patient Office Visit  Subjective:  Patient ID: Sheila Harris, female    DOB: [birth year]  Age: 65 y.o. MRN: [id number]  CC:  Chief Complaint  Patient presents with  . Annual Exam      HPI [name] presents for CPE.  She is doing well. She did have COVID 3 weeks ago.  She says it felt like a head cold  She is feeling much better.    She would like me to take over her bio identical hormones.  She is on estradiol cream, 2 mg/g cream.  Still clicks twice a day to inner thighs.  She typically gets a 30 g tube.    She also recently tried to donate blood and was told that her iron levels were too low to donate so she would like to have that checked today as well.  Colonoscopy is up-to-date.  Mammogram is also up-to-date.  Pap smear is up-to-date.  Tetanus vaccine is up-to-date.  Past Medical History:  Diagnosis Date  . Genital herpes   . Thyroid disease     No past surgical history on file.  Family History  Problem Relation Age of Onset  . Heart disease Mother   . Hypertension Mother   . Stroke Mother   . Breast cancer Sister     Social History   Socioeconomic History  . Marital status: Married    Spouse name: Not on file  . Number of children: Not on file  . Years of education: Not on file  . Highest education level: Not on file  Occupational History  . Not on file  Tobacco Use  . Smoking status: Never Smoker  . Smokeless tobacco: Never Used  Substance and Sexual Activity  . Alcohol use: Yes    Alcohol/week: 5.0 standard drinks    Types: 5 Standard drinks or equivalent per week    Comment: weekly  . Drug use: Never  . Sexual activity: Not Currently    Partners: Male  Other Topics Concern  . Not on file  Social History Narrative  . Not on file   Social Determinants of Health   Financial Resource Strain: Not on file  Food Insecurity: Not on file  Transportation Needs: Not on file  Physical Activity: Not on file  Stress: Not on file   Social Connections: Not on file  Intimate Partner Violence: Not on file    Outpatient Medications Prior to Visit  Medication Sig Dispense Refill  . alendronate (FOSAMAX) 70 MG tablet Take 1 tablet (70 mg total) by mouth once a week. Take with a full glass of water on an empty stomach. 12 tablet 3  . ascorbic acid (VITAMIN C) 100 MG tablet Take by mouth.    Marland Kitchen b complex vitamins tablet Take by mouth.    Marland Kitchen CALCIUM PO Take 1,500 mg by mouth daily.    . clonazePAM (KLONOPIN) 0.5 MG tablet 1/2 TABLET AT BEDTIME AS NEEDED FOR ANXIETY ORALLY 30 DAYS 10 tablet 1  . escitalopram (LEXAPRO) 10 MG tablet Take 15 mg by mouth daily.    Marland Kitchen levothyroxine (SYNTHROID) 75 MCG tablet Take 1 tablet (75 mcg total) by mouth daily. 90 tablet 1  . liothyronine (CYTOMEL) 25 MCG tablet Take 0.5 tablets (12.5 mcg total) by mouth daily. 45 tablet 1  . MAGNESIUM PO Take 1,000 mg by mouth daily.    . Multiple Vitamin (THERA) TABS Take by mouth.    . progesterone (PROMETRIUM) 200 MG capsule Take  200 mg by mouth at bedtime.    . triamcinolone ointment (KENALOG) 0.5 % Apply 1 application topically 2 (two) times daily. To affected area, avoid eyes and face 15 g 3  . valACYclovir (VALTREX) 500 MG tablet Take by mouth.    Marland Kitchen VITAMIN D PO Take 2,500 mg by mouth daily.    Marland Kitchen estradiol (CLIMARA - DOSED IN MG/24 HR) 0.025 mg/24hr patch Place 0.025 mg onto the skin once a week.     No facility-administered medications prior to visit.    No Known Allergies  ROS Review of Systems    Objective:    Physical Exam Vitals reviewed.  Constitutional:      Appearance: She is well-developed and well-nourished.  HENT:     Head: Normocephalic and atraumatic.     Right Ear: Tympanic membrane, ear canal and external ear normal.     Left Ear: Tympanic membrane, ear canal and external ear normal.     Nose: Nose normal.     Mouth/Throat:     Mouth: Mucous membranes are moist.     Pharynx: Oropharynx is clear. No oropharyngeal exudate  or posterior oropharyngeal erythema.  Eyes:     Extraocular Movements: EOM normal.     Conjunctiva/sclera: Conjunctivae normal.     Pupils: Pupils are equal, round, and reactive to light.  Neck:     Thyroid: No thyromegaly.  Cardiovascular:     Rate and Rhythm: Normal rate and regular rhythm.     Heart sounds: Normal heart sounds.  Pulmonary:     Effort: Pulmonary effort is normal.     Breath sounds: Normal breath sounds. No wheezing.  Abdominal:     General: Abdomen is flat.     Palpations: Abdomen is soft.  Musculoskeletal:     Cervical back: Neck supple.  Lymphadenopathy:     Cervical: No cervical adenopathy.  Skin:    General: Skin is warm and dry.     Coloration: Skin is not pale.  Neurological:     Mental Status: She is alert and oriented to person, place, and time.  Psychiatric:        Mood and Affect: Mood and affect normal.        Behavior: Behavior normal.     BP 110/64   Pulse 77   Ht 5\' 6"  (1.676 m)   Wt 130 lb (59 kg)   LMP 04/03/2010   SpO2 95%   BMI 20.98 kg/m  Wt Readings from Last 3 Encounters:  07/08/20 130 lb (59 kg)  05/20/20 135 lb (61.2 kg)  05/06/20 133 lb (60.3 kg)     Health Maintenance Due  Topic Date Due  . PAP SMEAR-Modifier  11/19/2019  . PNA vac Low Risk Adult (1 of 2 - PCV13) Never done    There are no preventive care reminders to display for this patient.  Lab Results  Component Value Date   TSH 1.57 01/21/2020   No results found for: WBC, HGB, HCT, MCV, PLT Lab Results  Component Value Date   NA 139 04/09/2020   K 5.0 04/09/2020   CO2 24 04/09/2020   GLUCOSE 82 04/09/2020   BUN 13 04/09/2020   CREATININE 0.77 04/09/2020   BILITOT 0.7 04/09/2020   ALKPHOS 77 01/21/2020   AST 24 04/09/2020   ALT 19 04/09/2020   PROT 6.5 04/09/2020   ALBUMIN 4.3 01/21/2020   CALCIUM 9.6 04/09/2020   GFR 75.21 01/21/2020   Lab Results  Component Value Date  CHOL 181 07/02/2019   Lab Results  Component Value Date   HDL 63  07/02/2019   Lab Results  Component Value Date   LDLCALC 106 07/02/2019   Lab Results  Component Value Date   TRIG 65 07/02/2019   No results found for: CHOLHDL Lab Results  Component Value Date   HGBA1C 5.3 10/29/2019      Assessment & Plan:   Problem List Items Addressed This Visit      Endocrine   Hypothyroidism   Relevant Orders   CBC   TSH + free T4   Thyroid peroxidase antibody   T3, free     Other   Hormone replacement therapy (HRT)    I be happy to take over her estradiol cream.  We will go ahead and check her estrogen and progesterone levels today.  She is doing well on her regimen.  She was previously getting this medication through [facility].  He did not do well with the hormone patch.      Relevant Orders   CBC   COMPLETE METABOLIC PANEL WITH GFR   Lipid panel   TSH + free T4   Thyroid peroxidase antibody   T3, free   Estradiol   Progesterone   Fe+TIBC+Fer    Other Visit Diagnoses    Wellness examination    -  Primary   Iron deficiency       Relevant Orders   CBC   Fe+TIBC+Fer   Screening, lipid       Relevant Orders   Lipid panel     Keep up a regular exercise program and make sure you are eating a healthy diet Try to eat 4 servings of dairy a day, or if you are lactose intolerant take a calcium with vitamin D daily.  Your vaccines are up to date.    No orders of the defined types were placed in this encounter.   Follow-up: No follow-ups on file.    [name], MD

## 2020-07-08 NOTE — Assessment & Plan Note (Signed)
I be happy to take over her estradiol cream.  We will go ahead and check her estrogen and progesterone levels today.  She is doing well on her regimen.  She was previously getting this medication through [facility].  He did not do well with the hormone patch.

## 2020-07-09 ENCOUNTER — Encounter: Payer: Self-pay | Admitting: Family Medicine

## 2020-07-09 DIAGNOSIS — L84 Corns and callosities: Secondary | ICD-10-CM

## 2020-07-09 LAB — ESTRADIOL: Estradiol: 40 pg/mL

## 2020-07-09 LAB — COMPLETE METABOLIC PANEL WITH GFR
AG Ratio: 2.4 (calc) (ref 1.0–2.5)
ALT: 15 U/L (ref 6–29)
AST: 20 U/L (ref 10–35)
Albumin: 4.4 g/dL (ref 3.6–5.1)
Alkaline phosphatase (APISO): 64 U/L (ref 37–153)
BUN: 15 mg/dL (ref 7–25)
CO2: 28 mmol/L (ref 20–32)
Calcium: 9.5 mg/dL (ref 8.6–10.4)
Chloride: 108 mmol/L (ref 98–110)
Creat: 0.9 mg/dL (ref 0.50–0.99)
GFR, Est African American: 78 mL/min/{1.73_m2} (ref >=60)
GFR, Est Non African American: 67 mL/min/{1.73_m2} (ref >=60)
Globulin: 1.8 g/dL (calc) — ABNORMAL LOW (ref 1.9–3.7)
Glucose, Bld: 89 mg/dL (ref 65–99)
Potassium: 5.7 mmol/L — ABNORMAL HIGH (ref 3.5–5.3)
Sodium: 142 mmol/L (ref 135–146)
Total Bilirubin: 0.4 mg/dL (ref 0.2–1.2)
Total Protein: 6.2 g/dL (ref 6.1–8.1)

## 2020-07-09 LAB — CBC
HCT: 40.3 % (ref 35.0–45.0)
Hemoglobin: 12.8 g/dL (ref 11.7–15.5)
MCH: 28.4 pg (ref 27.0–33.0)
MCHC: 31.8 g/dL — ABNORMAL LOW (ref 32.0–36.0)
MCV: 89.6 fL (ref 80.0–100.0)
MPV: 12.2 fL (ref 7.5–12.5)
Platelets: 279 10*3/uL (ref 140–400)
RBC: 4.5 10*6/uL (ref 3.80–5.10)
RDW: 13.3 % (ref 11.0–15.0)
WBC: 6.3 10*3/uL (ref 3.8–10.8)

## 2020-07-09 LAB — LIPID PANEL
Cholesterol: 155 mg/dL (ref <200)
HDL: 41 mg/dL — ABNORMAL LOW (ref >=50)
LDL Cholesterol (Calc): 95 mg/dL (calc)
Non-HDL Cholesterol (Calc): 114 mg/dL (calc) (ref <130)
Total CHOL/HDL Ratio: 3.8 (calc) (ref <5.0)
Triglycerides: 92 mg/dL (ref <150)

## 2020-07-09 LAB — IRON,TIBC AND FERRITIN PANEL
%SAT: 11 % (calc) — ABNORMAL LOW (ref 16–45)
Ferritin: 7 ng/mL — ABNORMAL LOW (ref 16–288)
Iron: 42 ug/dL — ABNORMAL LOW (ref 45–160)
TIBC: 387 mcg/dL (calc) (ref 250–450)

## 2020-07-09 LAB — PROGESTERONE: Progesterone: 37.7 ng/mL

## 2020-07-09 LAB — THYROID PEROXIDASE ANTIBODY: Thyroperoxidase Ab SerPl-aCnc: 11 IU/mL — ABNORMAL HIGH (ref <9)

## 2020-07-09 LAB — T3, FREE: T3, Free: 2.8 pg/mL (ref 2.3–4.2)

## 2020-07-09 LAB — TSH+FREE T4: TSH W/REFLEX TO FT4: 0.49 mIU/L (ref 0.40–4.50)

## 2020-07-12 ENCOUNTER — Other Ambulatory Visit: Payer: Self-pay | Admitting: *Deleted

## 2020-07-12 ENCOUNTER — Encounter: Payer: Self-pay | Admitting: Family Medicine

## 2020-07-12 DIAGNOSIS — E875 Hyperkalemia: Secondary | ICD-10-CM

## 2020-07-12 DIAGNOSIS — E611 Iron deficiency: Secondary | ICD-10-CM

## 2020-07-13 DIAGNOSIS — M25572 Pain in left ankle and joints of left foot: Secondary | ICD-10-CM | POA: Diagnosis not present

## 2020-07-15 DIAGNOSIS — M25572 Pain in left ankle and joints of left foot: Secondary | ICD-10-CM | POA: Diagnosis not present

## 2020-07-15 DIAGNOSIS — U071 COVID-19: Secondary | ICD-10-CM | POA: Diagnosis not present

## 2020-07-19 DIAGNOSIS — M5137 Other intervertebral disc degeneration, lumbosacral region: Secondary | ICD-10-CM | POA: Diagnosis not present

## 2020-07-19 DIAGNOSIS — M9901 Segmental and somatic dysfunction of cervical region: Secondary | ICD-10-CM | POA: Diagnosis not present

## 2020-07-19 DIAGNOSIS — M9904 Segmental and somatic dysfunction of sacral region: Secondary | ICD-10-CM | POA: Diagnosis not present

## 2020-07-19 DIAGNOSIS — M5032 Other cervical disc degeneration, mid-cervical region, unspecified level: Secondary | ICD-10-CM | POA: Diagnosis not present

## 2020-07-19 DIAGNOSIS — M5136 Other intervertebral disc degeneration, lumbar region: Secondary | ICD-10-CM | POA: Diagnosis not present

## 2020-07-19 DIAGNOSIS — M9903 Segmental and somatic dysfunction of lumbar region: Secondary | ICD-10-CM | POA: Diagnosis not present

## 2020-07-20 DIAGNOSIS — M25572 Pain in left ankle and joints of left foot: Secondary | ICD-10-CM | POA: Diagnosis not present

## 2020-07-20 DIAGNOSIS — U071 COVID-19: Secondary | ICD-10-CM | POA: Diagnosis not present

## 2020-07-21 ENCOUNTER — Encounter: Payer: Self-pay | Admitting: Family Medicine

## 2020-07-22 MED ORDER — PROGESTERONE MICRONIZED 100 MG PO CAPS: 100.0000 mg | ORAL_CAPSULE | Freq: Every day | ORAL | 0 refills | Status: DC

## 2020-07-22 NOTE — Telephone Encounter (Signed)
Sent to [facility]

## 2020-07-26 DIAGNOSIS — M79671 Pain in right foot: Secondary | ICD-10-CM | POA: Diagnosis not present

## 2020-07-26 DIAGNOSIS — G5761 Lesion of plantar nerve, right lower limb: Secondary | ICD-10-CM | POA: Diagnosis not present

## 2020-07-26 DIAGNOSIS — M21621 Bunionette of right foot: Secondary | ICD-10-CM | POA: Diagnosis not present

## 2020-07-26 DIAGNOSIS — M7741 Metatarsalgia, right foot: Secondary | ICD-10-CM | POA: Diagnosis not present

## 2020-07-26 DIAGNOSIS — L851 Acquired keratosis [keratoderma] palmaris et plantaris: Secondary | ICD-10-CM | POA: Diagnosis not present

## 2020-07-26 DIAGNOSIS — M2041 Other hammer toe(s) (acquired), right foot: Secondary | ICD-10-CM | POA: Diagnosis not present

## 2020-07-27 ENCOUNTER — Ambulatory Visit: Payer: PPO | Admitting: Internal Medicine

## 2020-07-27 DIAGNOSIS — U071 COVID-19: Secondary | ICD-10-CM | POA: Diagnosis not present

## 2020-07-27 DIAGNOSIS — M25572 Pain in left ankle and joints of left foot: Secondary | ICD-10-CM | POA: Diagnosis not present

## 2020-07-29 DIAGNOSIS — M9901 Segmental and somatic dysfunction of cervical region: Secondary | ICD-10-CM | POA: Diagnosis not present

## 2020-07-29 DIAGNOSIS — M5137 Other intervertebral disc degeneration, lumbosacral region: Secondary | ICD-10-CM | POA: Diagnosis not present

## 2020-07-29 DIAGNOSIS — M9904 Segmental and somatic dysfunction of sacral region: Secondary | ICD-10-CM | POA: Diagnosis not present

## 2020-07-29 DIAGNOSIS — M5032 Other cervical disc degeneration, mid-cervical region, unspecified level: Secondary | ICD-10-CM | POA: Diagnosis not present

## 2020-07-29 DIAGNOSIS — M5136 Other intervertebral disc degeneration, lumbar region: Secondary | ICD-10-CM | POA: Diagnosis not present

## 2020-07-29 DIAGNOSIS — M9903 Segmental and somatic dysfunction of lumbar region: Secondary | ICD-10-CM | POA: Diagnosis not present

## 2020-07-30 ENCOUNTER — Ambulatory Visit: Payer: PPO | Admitting: Podiatry

## 2020-08-02 ENCOUNTER — Other Ambulatory Visit: Payer: Self-pay | Admitting: *Deleted

## 2020-08-02 DIAGNOSIS — E039 Hypothyroidism, unspecified: Secondary | ICD-10-CM

## 2020-08-02 MED ORDER — LIOTHYRONINE SODIUM 25 MCG PO TABS: 12.5000 ug | ORAL_TABLET | Freq: Every day | ORAL | 1 refills | Status: DC

## 2020-08-04 DIAGNOSIS — E875 Hyperkalemia: Secondary | ICD-10-CM | POA: Diagnosis not present

## 2020-08-05 ENCOUNTER — Encounter: Payer: Self-pay | Admitting: Family Medicine

## 2020-08-05 LAB — POTASSIUM: Potassium: 4.7 mmol/L (ref 3.5–5.3)

## 2020-08-06 DIAGNOSIS — M5137 Other intervertebral disc degeneration, lumbosacral region: Secondary | ICD-10-CM | POA: Diagnosis not present

## 2020-08-06 DIAGNOSIS — M5136 Other intervertebral disc degeneration, lumbar region: Secondary | ICD-10-CM | POA: Diagnosis not present

## 2020-08-06 DIAGNOSIS — M9903 Segmental and somatic dysfunction of lumbar region: Secondary | ICD-10-CM | POA: Diagnosis not present

## 2020-08-06 DIAGNOSIS — M9901 Segmental and somatic dysfunction of cervical region: Secondary | ICD-10-CM | POA: Diagnosis not present

## 2020-08-06 DIAGNOSIS — M9904 Segmental and somatic dysfunction of sacral region: Secondary | ICD-10-CM | POA: Diagnosis not present

## 2020-08-06 DIAGNOSIS — M5032 Other cervical disc degeneration, mid-cervical region, unspecified level: Secondary | ICD-10-CM | POA: Diagnosis not present

## 2020-08-10 DIAGNOSIS — U071 COVID-19: Secondary | ICD-10-CM | POA: Diagnosis not present

## 2020-08-10 DIAGNOSIS — M25572 Pain in left ankle and joints of left foot: Secondary | ICD-10-CM | POA: Diagnosis not present

## 2020-08-17 ENCOUNTER — Encounter: Payer: Self-pay | Admitting: Family Medicine

## 2020-09-11 DIAGNOSIS — M5136 Other intervertebral disc degeneration, lumbar region: Secondary | ICD-10-CM | POA: Diagnosis not present

## 2020-09-11 DIAGNOSIS — M5137 Other intervertebral disc degeneration, lumbosacral region: Secondary | ICD-10-CM | POA: Diagnosis not present

## 2020-09-11 DIAGNOSIS — M9901 Segmental and somatic dysfunction of cervical region: Secondary | ICD-10-CM | POA: Diagnosis not present

## 2020-09-11 DIAGNOSIS — M9904 Segmental and somatic dysfunction of sacral region: Secondary | ICD-10-CM | POA: Diagnosis not present

## 2020-09-11 DIAGNOSIS — M5032 Other cervical disc degeneration, mid-cervical region, unspecified level: Secondary | ICD-10-CM | POA: Diagnosis not present

## 2020-09-11 DIAGNOSIS — M9903 Segmental and somatic dysfunction of lumbar region: Secondary | ICD-10-CM | POA: Diagnosis not present

## 2020-10-07 DIAGNOSIS — M1712 Unilateral primary osteoarthritis, left knee: Secondary | ICD-10-CM | POA: Diagnosis not present

## 2020-10-07 DIAGNOSIS — M1711 Unilateral primary osteoarthritis, right knee: Secondary | ICD-10-CM | POA: Diagnosis not present

## 2020-10-08 DIAGNOSIS — M21621 Bunionette of right foot: Secondary | ICD-10-CM | POA: Diagnosis not present

## 2020-10-08 DIAGNOSIS — M7741 Metatarsalgia, right foot: Secondary | ICD-10-CM | POA: Diagnosis not present

## 2020-10-08 DIAGNOSIS — M2041 Other hammer toe(s) (acquired), right foot: Secondary | ICD-10-CM | POA: Diagnosis not present

## 2020-10-08 DIAGNOSIS — M79671 Pain in right foot: Secondary | ICD-10-CM | POA: Diagnosis not present

## 2020-10-08 DIAGNOSIS — G5761 Lesion of plantar nerve, right lower limb: Secondary | ICD-10-CM | POA: Diagnosis not present

## 2020-10-08 DIAGNOSIS — L851 Acquired keratosis [keratoderma] palmaris et plantaris: Secondary | ICD-10-CM | POA: Diagnosis not present

## 2020-10-19 ENCOUNTER — Other Ambulatory Visit: Payer: Self-pay | Admitting: *Deleted

## 2020-10-19 NOTE — Progress Notes (Signed)
Refill for estridiol cream called into [facility]

## 2020-11-01 ENCOUNTER — Encounter: Payer: Self-pay | Admitting: Family Medicine

## 2020-11-01 ENCOUNTER — Other Ambulatory Visit: Payer: Self-pay

## 2020-11-01 ENCOUNTER — Ambulatory Visit (INDEPENDENT_AMBULATORY_CARE_PROVIDER_SITE_OTHER): Payer: PPO | Admitting: Family Medicine

## 2020-11-01 VITALS — BP 107/66 | HR 84 | Ht 66.0 in | Wt 129.0 lb

## 2020-11-01 DIAGNOSIS — L989 Disorder of the skin and subcutaneous tissue, unspecified: Secondary | ICD-10-CM | POA: Diagnosis not present

## 2020-11-01 DIAGNOSIS — F439 Reaction to severe stress, unspecified: Secondary | ICD-10-CM | POA: Diagnosis not present

## 2020-11-01 NOTE — Progress Notes (Signed)
b  Acute Office Visit  Subjective:    Patient ID: Sheila Harris, female    DOB: 23-Jun-1955, 65 y.o.   MRN: [id number]  Chief Complaint  Patient presents with  . Nevus    HPI Patient is in today for skin lesion.  She says she noticed an area that looks scabbed on her mid back she was not sure if it was a tick or not she has been walking her dog at the local park and he likes to go down by the water area.  She recently started using tick spray in the last couple of days.  Also let me know she started working part-time recently 3 evenings per week at [employer].  She says that its been asked really exhausting her.  But she is also not been sleeping well because her husband has been pacing the floors at night and not sleeping well which then affects her.  Past Medical History:  Diagnosis Date  . Genital herpes   . Thyroid disease     No past surgical history on file.  Family History  Problem Relation Age of Onset  . Heart disease Mother   . Hypertension Mother   . Stroke Mother   . Breast cancer Sister     Social History   Socioeconomic History  . Marital status: Married    Spouse name: Not on file  . Number of children: Not on file  . Years of education: Not on file  . Highest education level: Not on file  Occupational History  . Not on file  Tobacco Use  . Smoking status: Never Smoker  . Smokeless tobacco: Never Used  Substance and Sexual Activity  . Alcohol use: Yes    Alcohol/week: 5.0 standard drinks    Types: 5 Standard drinks or equivalent per week    Comment: weekly  . Drug use: Never  . Sexual activity: Not Currently    Partners: Male  Other Topics Concern  . Not on file  Social History Narrative  . Not on file   Social Determinants of Health   Financial Resource Strain: Not on file  Food Insecurity: Not on file  Transportation Needs: Not on file  Physical Activity: Not on file  Stress: Not on file  Social Connections: Not on file  Intimate Partner  Violence: Not on file    Outpatient Medications Prior to Visit  Medication Sig Dispense Refill  . alendronate (FOSAMAX) 70 MG tablet Take 1 tablet (70 mg total) by mouth once a week. Take with a full glass of water on an empty stomach. 12 tablet 3  . AMBULATORY NON FORMULARY MEDICATION apply 2 clicks twice daily to inner thighs    . ascorbic acid (VITAMIN C) 100 MG tablet Take by mouth.    Marland Kitchen b complex vitamins tablet Take by mouth.    Marland Kitchen CALCIUM PO Take 1,500 mg by mouth daily.    . clonazePAM (KLONOPIN) 0.5 MG tablet 1/2 TABLET AT BEDTIME AS NEEDED FOR ANXIETY ORALLY 30 DAYS 10 tablet 1  . escitalopram (LEXAPRO) 10 MG tablet Take 15 mg by mouth daily.    Marland Kitchen levothyroxine (SYNTHROID) 75 MCG tablet Take 1 tablet (75 mcg total) by mouth daily. 90 tablet 1  . liothyronine (CYTOMEL) 25 MCG tablet Take 0.5 tablets (12.5 mcg total) by mouth daily. 45 tablet 1  . MAGNESIUM PO Take 1,000 mg by mouth daily.    . Multiple Vitamin (THERA) TABS Take by mouth.    Marland Kitchen  progesterone (PROMETRIUM) 100 MG capsule Take 1 capsule (100 mg total) by mouth at bedtime. 90 capsule 0  . triamcinolone ointment (KENALOG) 0.5 % Apply 1 application topically 2 (two) times daily. To affected area, avoid eyes and face 15 g 3  . valACYclovir (VALTREX) 500 MG tablet Take by mouth.    Marland Kitchen VITAMIN D PO Take 2,500 mg by mouth daily.     No facility-administered medications prior to visit.    No Known Allergies  Review of Systems     Objective:    Physical Exam  BP 107/66   Pulse 84   Ht 5\' 6"  (1.676 m)   Wt 129 lb (58.5 kg)   LMP 04/03/2010   SpO2 97%   BMI 20.82 kg/m  Wt Readings from Last 3 Encounters:  11/01/20 129 lb (58.5 kg)  07/08/20 130 lb (59 kg)  05/20/20 135 lb (61.2 kg)    Health Maintenance Due  Topic Date Due  . PAP SMEAR-Modifier  11/19/2019  . PNA vac Low Risk Adult (1 of 2 - PCV13) Never done  . COVID-19 Vaccine (4 - Booster for Pfizer series) 08/04/2020    There are no preventive care  reminders to display for this patient.   Lab Results  Component Value Date   TSH 1.57 01/21/2020   Lab Results  Component Value Date   WBC 6.3 07/08/2020   HGB 12.8 07/08/2020   HCT 40.3 07/08/2020   MCV 89.6 07/08/2020   PLT 279 07/08/2020   Lab Results  Component Value Date   NA 142 07/08/2020   K 4.7 08/04/2020   CO2 28 07/08/2020   GLUCOSE 89 07/08/2020   BUN 15 07/08/2020   CREATININE 0.90 07/08/2020   BILITOT 0.4 07/08/2020   ALKPHOS 77 01/21/2020   AST 20 07/08/2020   ALT 15 07/08/2020   PROT 6.2 07/08/2020   ALBUMIN 4.3 01/21/2020   CALCIUM 9.5 07/08/2020   GFR 75.21 01/21/2020   Lab Results  Component Value Date   CHOL 155 07/08/2020   Lab Results  Component Value Date   HDL 41 (L) 07/08/2020   Lab Results  Component Value Date   LDLCALC 95 07/08/2020   Lab Results  Component Value Date   TRIG 92 07/08/2020   Lab Results  Component Value Date   CHOLHDL 3.8 07/08/2020   Lab Results  Component Value Date   HGBA1C 5.3 10/29/2019       Assessment & Plan:   Problem List Items Addressed This Visit   None    Skin lesion most consistent with an inflamed and scab seborrheic keratoses I did look at the lesion under 10 times magnification no sign of a tick or tick bite.  She has any problems or concerns about how it is healing am happy to look at it again.  She will use her tick spray regularly with walking the dog.  Stress mixed with poor sleep quality-we discussed the situation with her job with being awake all day especially after not sleeping well and having to go to work in the evenings.  She said overall she probably just needs to make a decision whether or not she wants to continue to work or not.  No orders of the defined types were placed in this encounter.    [name], MD

## 2020-11-06 ENCOUNTER — Other Ambulatory Visit: Payer: Self-pay | Admitting: Family Medicine

## 2020-11-06 DIAGNOSIS — E039 Hypothyroidism, unspecified: Secondary | ICD-10-CM

## 2020-11-08 ENCOUNTER — Encounter: Payer: Self-pay | Admitting: Family Medicine

## 2020-11-08 DIAGNOSIS — M9901 Segmental and somatic dysfunction of cervical region: Secondary | ICD-10-CM | POA: Diagnosis not present

## 2020-11-08 DIAGNOSIS — M5136 Other intervertebral disc degeneration, lumbar region: Secondary | ICD-10-CM | POA: Diagnosis not present

## 2020-11-08 DIAGNOSIS — M9904 Segmental and somatic dysfunction of sacral region: Secondary | ICD-10-CM | POA: Diagnosis not present

## 2020-11-08 DIAGNOSIS — M5032 Other cervical disc degeneration, mid-cervical region, unspecified level: Secondary | ICD-10-CM | POA: Diagnosis not present

## 2020-11-08 DIAGNOSIS — M9903 Segmental and somatic dysfunction of lumbar region: Secondary | ICD-10-CM | POA: Diagnosis not present

## 2020-11-08 DIAGNOSIS — M5137 Other intervertebral disc degeneration, lumbosacral region: Secondary | ICD-10-CM | POA: Diagnosis not present

## 2020-11-09 ENCOUNTER — Other Ambulatory Visit: Payer: Self-pay | Admitting: *Deleted

## 2020-11-09 DIAGNOSIS — E039 Hypothyroidism, unspecified: Secondary | ICD-10-CM

## 2020-11-09 MED ORDER — LEVOTHYROXINE SODIUM 75 MCG PO TABS: 75.0000 ug | ORAL_TABLET | Freq: Every day | ORAL | 1 refills | Status: DC

## 2020-11-28 IMAGING — MG DIGITAL SCREENING BILAT W/ TOMO W/ CAD
8 series · 9 of 24 positions shown · non-contrast
Comparison: Previous exam(s).

CLINICAL DATA: Screening.

EXAM:
DIGITAL SCREENING BILATERAL MAMMOGRAM WITH TOMO AND CAD

[R MLO synth-2D]
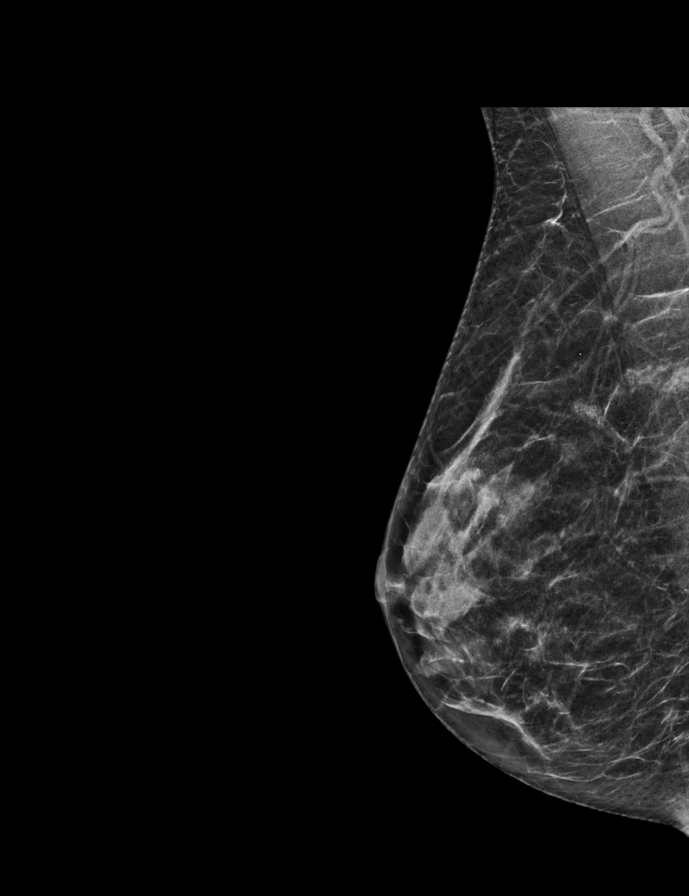

[L CC synth-2D]
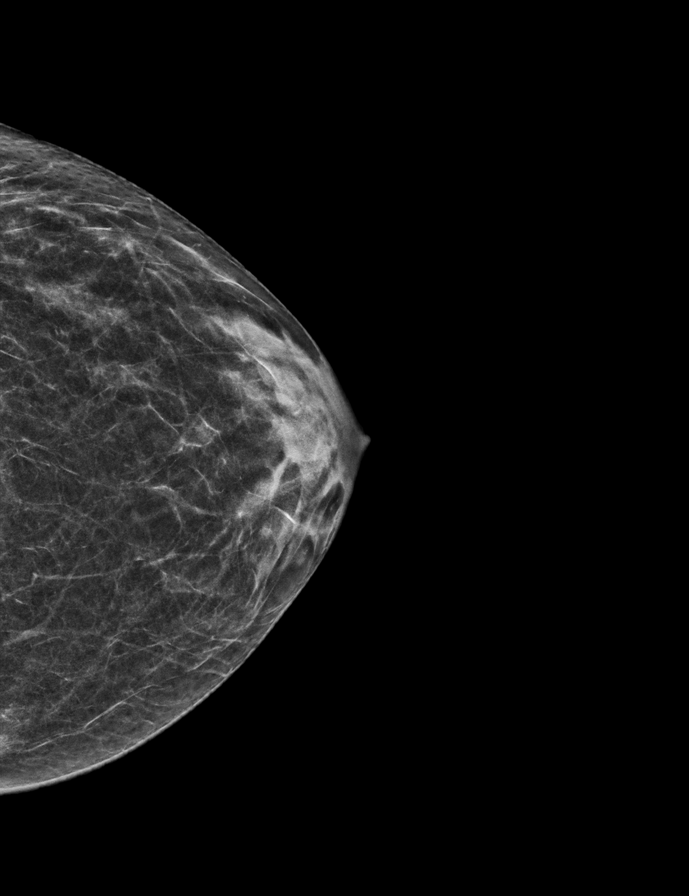

[L MLO synth-2D]
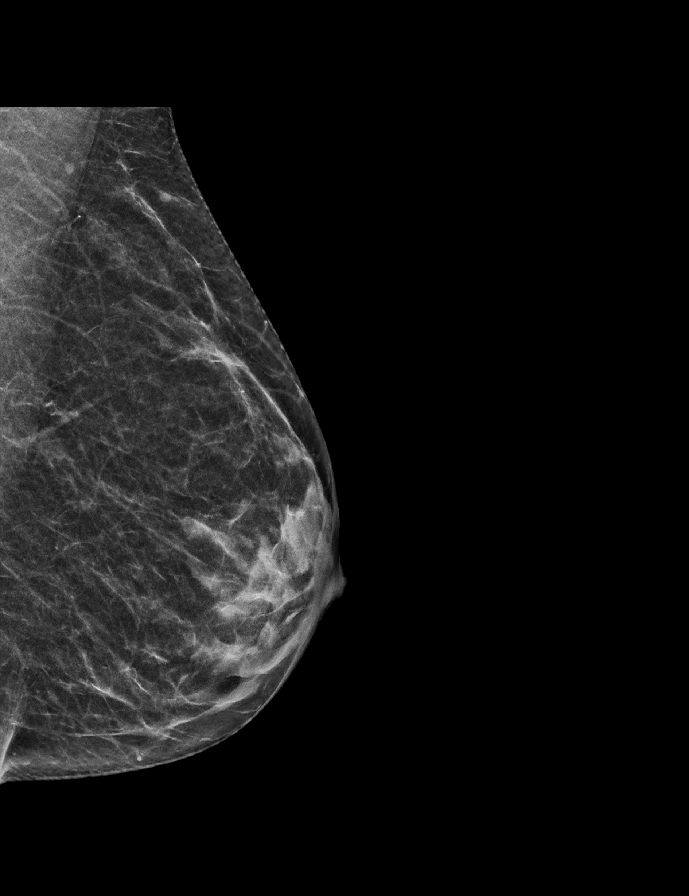

[R CC synth-2D]
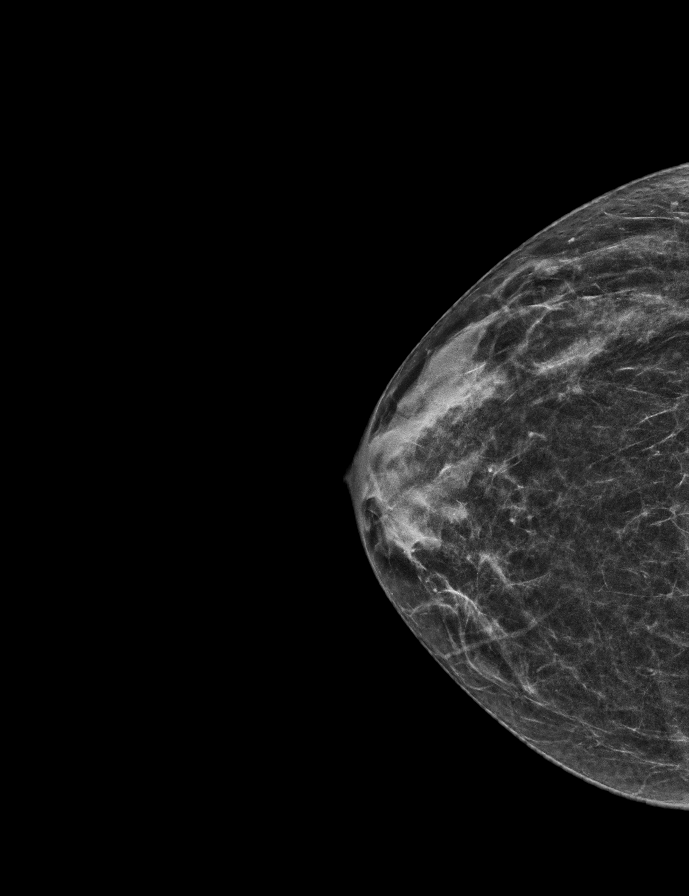

[R CC tomo · 2 of 46 frames shown]
[frame 15/46]
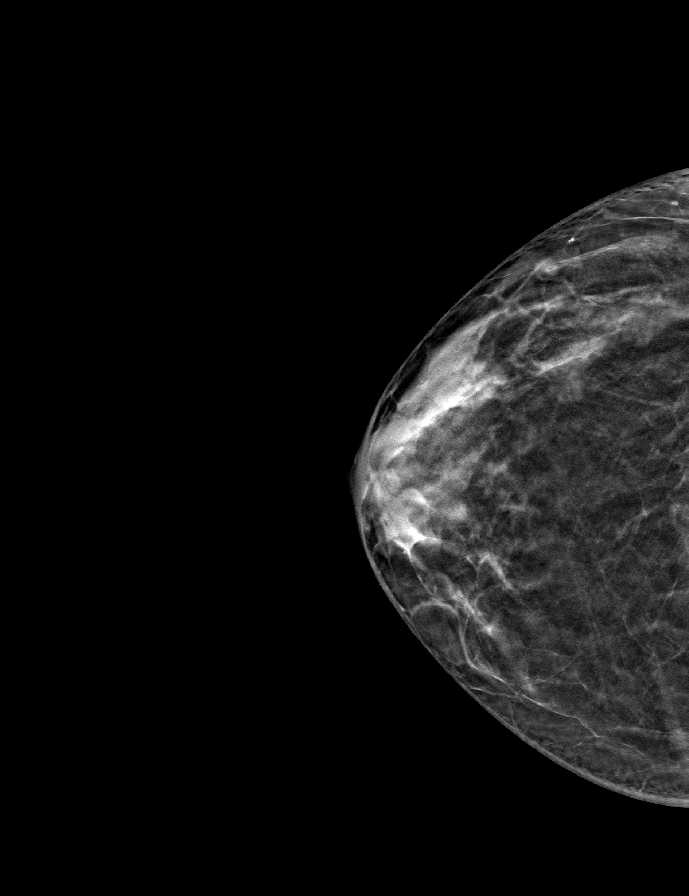
[frame 23/46]
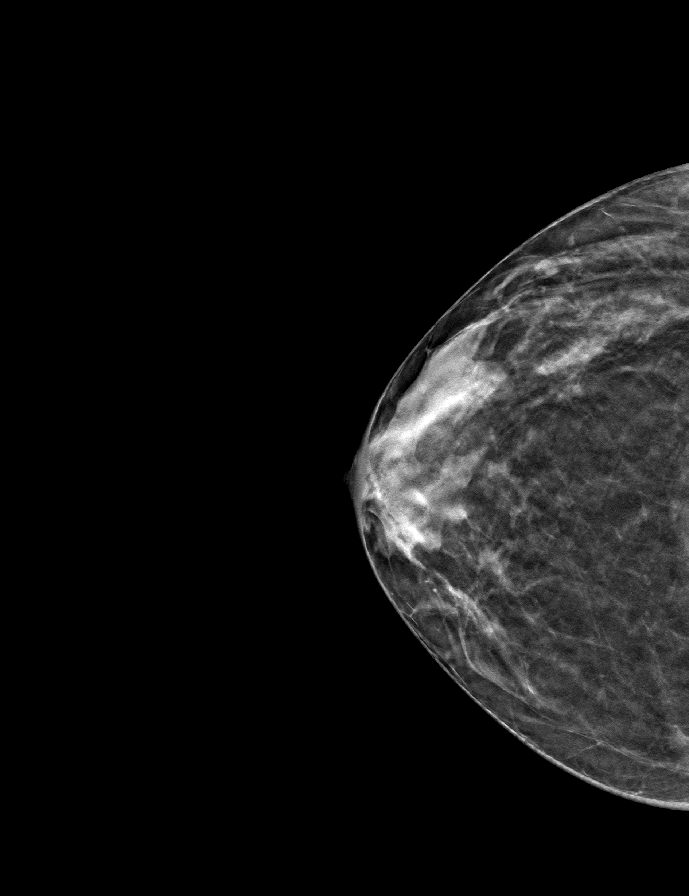

[R MLO tomo · tomo slice 25/49.0]
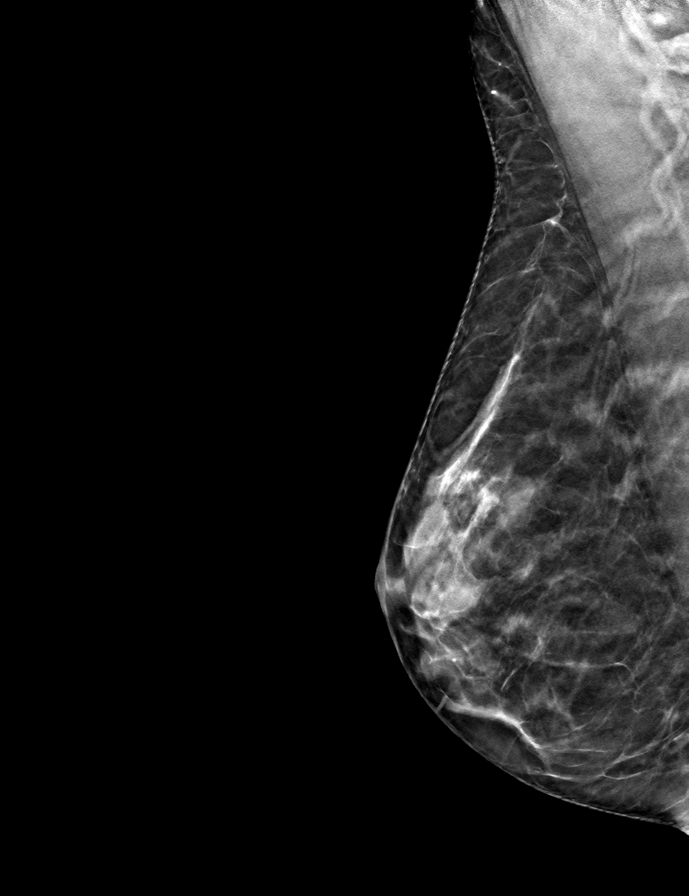

[L CC tomo · tomo slice 24/47.0]
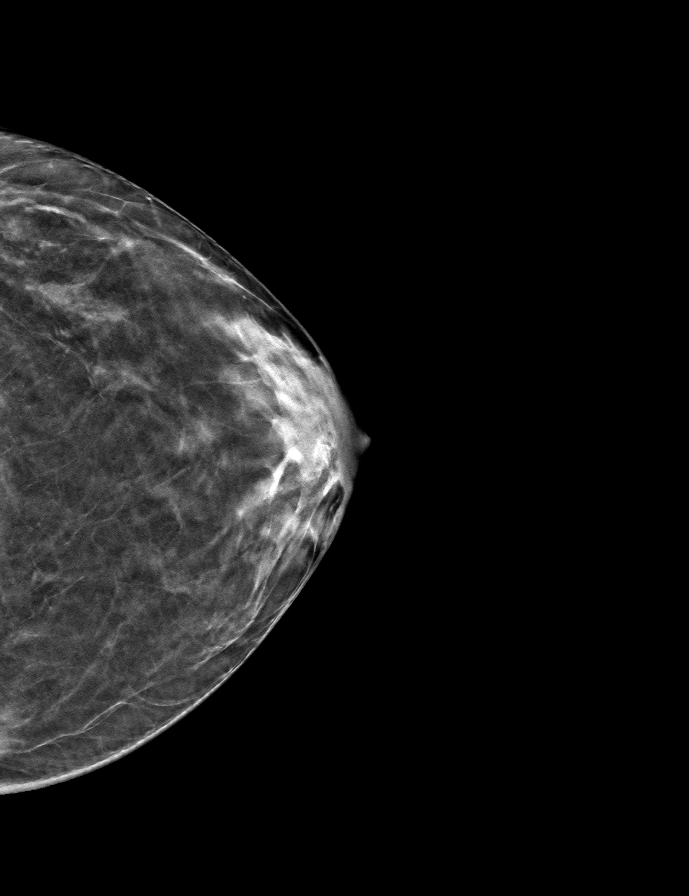

[L MLO tomo · tomo slice 25/48.0]
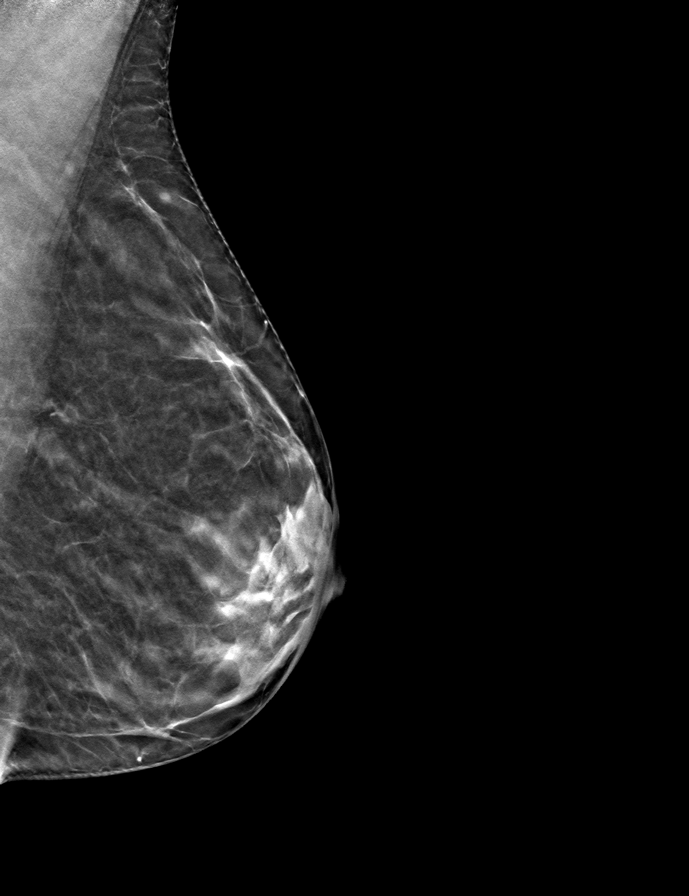

[9 of 24 positions shown; findings below may reference images not displayed]

ACR Breast Density Category c: The breast tissue is heterogeneously
dense, which may obscure small masses.
FINDINGS: There are no findings suspicious for malignancy. Images were
processed with CAD.
IMPRESSION: No mammographic evidence of malignancy. A result letter of this
screening mammogram will be mailed directly to the patient.

RECOMMENDATION:
Screening mammogram in one year. (Code:FT-U-LHB)

BI-RADS CATEGORY  1: Negative.

## 2020-11-30 ENCOUNTER — Ambulatory Visit (INDEPENDENT_AMBULATORY_CARE_PROVIDER_SITE_OTHER): Payer: PPO | Admitting: Physician Assistant

## 2020-11-30 ENCOUNTER — Encounter: Payer: Self-pay | Admitting: Physician Assistant

## 2020-11-30 ENCOUNTER — Other Ambulatory Visit: Payer: Self-pay

## 2020-11-30 VITALS — BP 125/63 | HR 76 | Ht 66.0 in | Wt 133.0 lb

## 2020-11-30 DIAGNOSIS — W57XXXD Bitten or stung by nonvenomous insect and other nonvenomous arthropods, subsequent encounter: Secondary | ICD-10-CM

## 2020-11-30 DIAGNOSIS — N760 Acute vaginitis: Secondary | ICD-10-CM | POA: Insufficient documentation

## 2020-11-30 LAB — WET PREP FOR TRICH, YEAST, CLUE
MICRO NUMBER:: 12005245
Specimen Quality: ADEQUATE

## 2020-11-30 MED ORDER — TRIAMCINOLONE ACETONIDE 0.5 % EX OINT
1.0000 | TOPICAL_OINTMENT | Freq: Two times a day (BID) | CUTANEOUS | 1 refills | Status: DC
Start: 2020-11-30 — End: 2021-07-11

## 2020-11-30 NOTE — Progress Notes (Signed)
No Trich, yeast or clue cells. I believe this is more irritation. I would do 1/2 cup of apple cider vinegar in bath soak, pat area dry, and ice as needed. Let me know how symptoms are in 2 days.

## 2020-11-30 NOTE — Progress Notes (Signed)
   Subjective:    Patient ID: Sheila Harris, female    DOB: 1955-10-21, 65 y.o.   MRN: [id number]  HPI Patient is a 65 year old female who presents to the clinic with 2 weeks of vaginal irritation and intermittent burning.  She denies any active discharge, swelling, and painful to touch.  She denies any dysuria, increase in urine frequency or urgency.  She is not sexually active. She did go to a friends pool recently and could have washed with something new. No recent hx of vaginitis. No fever, chills, n/v/d. She does admit to sweating a lot in her vaginal area in the heat. At times her underwear will be wet from sweat.   .. Active Ambulatory Problems    Diagnosis Date Noted   Pes cavus 11/21/2013   Right lumbar radiculopathy 11/21/2013   Trigger finger of left hand 10/08/2015   Primary osteoarthritis of right hand 12/30/2015   Right cervical radiculopathy 12/30/2015   Carpal tunnel syndrome, right 02/22/2016   Trigger finger of right hand 06/07/2016   Medial epicondylitis of elbow, left 09/29/2016   Recurrent low back pain 11/19/2019   Herpes infection 11/19/2019   Osteoporosis 11/19/2019   Factor V Leiden ([place]) 11/19/2019   Hypothyroidism 11/19/2019   Anxiety 11/19/2019   Corn of foot right, 4/5 interspace 06/07/2020   Hormone replacement therapy (HRT) 07/08/2020   Acute vaginitis 11/30/2020   Resolved Ambulatory Problems    Diagnosis Date Noted   Right wrist pain 04/14/2015   Past Medical History:  Diagnosis Date   Genital herpes    Thyroid disease      Review of Systems See HPI.     Objective:   Physical Exam Vitals reviewed.  Constitutional:      Appearance: Normal appearance.  Pulmonary:     Effort: Pulmonary effort is normal.  Abdominal:     General: Bowel sounds are normal. There is no distension.     Palpations: Abdomen is soft. There is no mass.     Tenderness: There is no abdominal tenderness. There is no right CVA tenderness, left CVA tenderness,  guarding or rebound.  Genitourinary:    Comments: Slight erythema of labia minora and just at the base of vaginal opening. No active discharge. No ulcerations.  Skin:    Comments: Bug bites on legs scattered.   Neurological:     General: No focal deficit present.     Mental Status: She is alert and oriented to person, place, and time.  Psychiatric:        Mood and Affect: Mood normal.          Assessment & Plan:  Marland KitchenMarland KitchenMariah was seen today for vaginal itching.  Diagnoses and all orders for this visit:  Acute vaginitis -     WET PREP FOR TRICH, YEAST, CLUE  Bug bite, subsequent encounter -     triamcinolone ointment (KENALOG) 0.5 %; Apply 1 application topically 2 (two) times daily. To affected area, avoid eyes and face   No significant findings with pelvic exam. No active discharge. Redness at base of vaginal opening. Will order wet prep. Suggested apple cider vinegar bath. Cool compresses. Try to keep vaginal area dry. If wet prep shows anything will treat accordingly. Follow up as needed or if symptoms worsen.   Bug bites that are itchy. Topical steroid given to use as needed.

## 2020-12-01 ENCOUNTER — Encounter: Payer: Self-pay | Admitting: Physician Assistant

## 2020-12-03 ENCOUNTER — Telehealth: Payer: Self-pay | Admitting: Neurology

## 2020-12-03 NOTE — Telephone Encounter (Signed)
LMOM letting patient know that cream was already sent in and to call with any questions.

## 2020-12-03 NOTE — Telephone Encounter (Signed)
Patient left vm stating vaginal irritation is better but she showed Sheila Harris a chigger bite in the office and now that is worse. She has tried hydrocortisone cream OTC and wondering if she would send in a steroid cream for her to [facility] in [place]. Please advise.

## 2020-12-03 NOTE — Telephone Encounter (Signed)
On 6/14 I sent triamcinolone cream to pharmacy to use for the bite.

## 2020-12-09 ENCOUNTER — Other Ambulatory Visit: Payer: Self-pay | Admitting: Family Medicine

## 2020-12-09 DIAGNOSIS — Z1231 Encounter for screening mammogram for malignant neoplasm of breast: Secondary | ICD-10-CM

## 2020-12-13 ENCOUNTER — Encounter: Payer: Self-pay | Admitting: Family Medicine

## 2020-12-14 DIAGNOSIS — M7741 Metatarsalgia, right foot: Secondary | ICD-10-CM | POA: Diagnosis not present

## 2020-12-14 DIAGNOSIS — G5761 Lesion of plantar nerve, right lower limb: Secondary | ICD-10-CM | POA: Diagnosis not present

## 2020-12-14 DIAGNOSIS — M21621 Bunionette of right foot: Secondary | ICD-10-CM | POA: Diagnosis not present

## 2020-12-14 DIAGNOSIS — M2041 Other hammer toe(s) (acquired), right foot: Secondary | ICD-10-CM | POA: Diagnosis not present

## 2020-12-14 MED ORDER — VALACYCLOVIR HCL 500 MG PO TABS: 500.0000 mg | ORAL_TABLET | Freq: Every day | ORAL | 3 refills | Status: DC

## 2020-12-14 MED ORDER — VALACYCLOVIR HCL 500 MG PO TABS: 500.0000 mg | ORAL_TABLET | Freq: Two times a day (BID) | ORAL | 3 refills | Status: DC

## 2020-12-14 NOTE — Telephone Encounter (Signed)
New rx sent for BID dosing

## 2020-12-14 NOTE — Addendum Note (Signed)
Addended by: [name] D on: 12/14/2020 05:21 PM   Modules accepted: Orders

## 2020-12-14 NOTE — Addendum Note (Signed)
Addended by: [name] D on: 12/14/2020 07:44 AM   Modules accepted: Orders

## 2020-12-21 DIAGNOSIS — M5032 Other cervical disc degeneration, mid-cervical region, unspecified level: Secondary | ICD-10-CM | POA: Diagnosis not present

## 2020-12-21 DIAGNOSIS — M5137 Other intervertebral disc degeneration, lumbosacral region: Secondary | ICD-10-CM | POA: Diagnosis not present

## 2020-12-21 DIAGNOSIS — M5136 Other intervertebral disc degeneration, lumbar region: Secondary | ICD-10-CM | POA: Diagnosis not present

## 2020-12-21 DIAGNOSIS — M9904 Segmental and somatic dysfunction of sacral region: Secondary | ICD-10-CM | POA: Diagnosis not present

## 2020-12-21 DIAGNOSIS — M9903 Segmental and somatic dysfunction of lumbar region: Secondary | ICD-10-CM | POA: Diagnosis not present

## 2020-12-21 DIAGNOSIS — M9901 Segmental and somatic dysfunction of cervical region: Secondary | ICD-10-CM | POA: Diagnosis not present

## 2020-12-29 ENCOUNTER — Telehealth: Payer: Self-pay

## 2020-12-29 DIAGNOSIS — M25561 Pain in right knee: Secondary | ICD-10-CM | POA: Diagnosis not present

## 2020-12-29 NOTE — Chronic Care Management (AMB) (Signed)
  Chronic Care Management   Note  12/29/2020 Name: GAIA GULLIKSON MRN: [id number] DOB: Nov 17, 1955  Toy Care Skoczylas is a 65 y.o. year old female who is a primary care patient of Metheney, Rene Kocher, MD. I reached out to [employer] by phone today in response to a referral sent by Ms. Toy Care Seelinger's PCP, Hali Marry, MD      Ms. Bekker was given information about Chronic Care Management services today including:  CCM service includes personalized support from designated clinical staff supervised by her physician, including individualized plan of care and coordination with other care providers 24/7 contact phone numbers for assistance for urgent and routine care needs. Service will only be billed when office clinical staff spend 20 minutes or more in a month to coordinate care. Only one practitioner may furnish and bill the service in a calendar month. The patient may stop CCM services at any time (effective at the end of the month) by phone call to the office staff. The patient will be responsible for cost sharing (co-pay) of up to 20% of the service fee (after annual deductible is met).  Patient agreed to services and verbal consent obtained.   Follow up plan: Telephone appointment with care management team member scheduled for:01/19/2021  Noreene Larsson, [place], [place], [place] [address] Direct Dial: [phone] [email or web address] Website: [email or web address]

## 2021-01-05 DIAGNOSIS — M25561 Pain in right knee: Secondary | ICD-10-CM | POA: Diagnosis not present

## 2021-01-09 ENCOUNTER — Other Ambulatory Visit: Payer: Self-pay | Admitting: Family Medicine

## 2021-01-12 DIAGNOSIS — M84374A Stress fracture, right foot, initial encounter for fracture: Secondary | ICD-10-CM | POA: Diagnosis not present

## 2021-01-17 ENCOUNTER — Telehealth: Payer: Self-pay | Admitting: *Deleted

## 2021-01-17 NOTE — Chronic Care Management (AMB) (Signed)
  Care Management   Note  01/17/2021 Name: Sheila Harris MRN: [id number] DOB: 10-Oct-1955  Sheila Harris is a 65 y.o. year old female who is a primary care patient of Hali Marry, MD and is actively engaged with the care management team. I reached out to [facility] [place] by phone today to assist with re-scheduling an initial visit with the RN Case Manager  Follow up plan: Patient declines further follow up and engagement by the care management team. Appropriate care team members and provider have been notified via electronic communication.   Julian Hy, [facility] Management  Direct Dial: [phone]

## 2021-01-17 NOTE — Chronic Care Management (AMB) (Signed)
  Care Management   Note  01/17/2021 Name: Sheila Harris MRN: [id number] DOB: May 19, 1956  [name] is a 65 y.o. year old female who is a primary care patient of Hali Marry, MD and is actively engaged with the care management team. I reached out to [place] by phone today to assist with re-scheduling an initial visit with the RN Case Manager  Follow up plan: Unsuccessful telephone outreach attempt made. A HIPAA compliant phone message was left for the patient providing contact information and requesting a return call.   Julian Hy, [facility] Management  Direct Dial: [phone]

## 2021-01-19 ENCOUNTER — Telehealth: Payer: PPO

## 2021-01-20 ENCOUNTER — Encounter: Payer: Self-pay | Admitting: Family Medicine

## 2021-01-20 ENCOUNTER — Other Ambulatory Visit: Payer: Self-pay | Admitting: Family Medicine

## 2021-01-21 MED ORDER — PROGESTERONE MICRONIZED 100 MG PO CAPS: 100.0000 mg | ORAL_CAPSULE | Freq: Every day | ORAL | 0 refills | Status: DC

## 2021-01-21 NOTE — Addendum Note (Signed)
Addended by: Narda Rutherford on: 01/21/2021 10:34 AM   Modules accepted: Orders

## 2021-02-07 DIAGNOSIS — M84374D Stress fracture, right foot, subsequent encounter for fracture with routine healing: Secondary | ICD-10-CM | POA: Diagnosis not present

## 2021-02-08 ENCOUNTER — Ambulatory Visit
Admission: RE | Admit: 2021-02-08 | Discharge: 2021-02-08 | Disposition: A | Discharge status: Home / Self Care | Payer: PPO | Source: Ambulatory Visit | Attending: Family Medicine | Admitting: Family Medicine

## 2021-02-08 ENCOUNTER — Other Ambulatory Visit: Payer: Self-pay

## 2021-02-08 ENCOUNTER — Encounter: Payer: Self-pay | Admitting: Family Medicine

## 2021-02-08 DIAGNOSIS — Z1231 Encounter for screening mammogram for malignant neoplasm of breast: Secondary | ICD-10-CM

## 2021-02-08 DIAGNOSIS — M84376A Stress fracture, unspecified foot, initial encounter for fracture: Secondary | ICD-10-CM

## 2021-02-08 DIAGNOSIS — M818 Other osteoporosis without current pathological fracture: Secondary | ICD-10-CM

## 2021-02-08 DIAGNOSIS — E039 Hypothyroidism, unspecified: Secondary | ICD-10-CM

## 2021-02-09 DIAGNOSIS — D485 Neoplasm of uncertain behavior of skin: Secondary | ICD-10-CM | POA: Diagnosis not present

## 2021-02-09 DIAGNOSIS — L814 Other melanin hyperpigmentation: Secondary | ICD-10-CM | POA: Diagnosis not present

## 2021-02-09 DIAGNOSIS — L578 Other skin changes due to chronic exposure to nonionizing radiation: Secondary | ICD-10-CM | POA: Diagnosis not present

## 2021-02-09 DIAGNOSIS — L570 Actinic keratosis: Secondary | ICD-10-CM | POA: Diagnosis not present

## 2021-02-09 DIAGNOSIS — D1801 Hemangioma of skin and subcutaneous tissue: Secondary | ICD-10-CM | POA: Diagnosis not present

## 2021-02-11 DIAGNOSIS — M818 Other osteoporosis without current pathological fracture: Secondary | ICD-10-CM | POA: Diagnosis not present

## 2021-02-11 DIAGNOSIS — E039 Hypothyroidism, unspecified: Secondary | ICD-10-CM | POA: Diagnosis not present

## 2021-02-11 DIAGNOSIS — M84376A Stress fracture, unspecified foot, initial encounter for fracture: Secondary | ICD-10-CM | POA: Diagnosis not present

## 2021-02-12 LAB — TSH+FREE T4: TSH W/REFLEX TO FT4: 4.51 mIU/L — ABNORMAL HIGH (ref 0.40–4.50)

## 2021-02-12 LAB — VITAMIN D 25 HYDROXY (VIT D DEFICIENCY, FRACTURES): Vit D, 25-Hydroxy: 79 ng/mL (ref 30–100)

## 2021-02-12 LAB — T4, FREE: Free T4: 0.8 ng/dL (ref 0.8–1.8)

## 2021-02-15 ENCOUNTER — Ambulatory Visit: Payer: PPO | Admitting: Family Medicine

## 2021-02-15 NOTE — Progress Notes (Signed)
Hi Sheila Harris, sorry for the delay in getting back to you.  Your thyroid level is off slightly.  Please verify that you have been taking the medication regularly and did not run out recently if you have then I would like to bump up your dose to the 81 mcg tab and then plan to recheck your level again in 6 to 8 weeks.  Your vitamin D level looks fantastic.

## 2021-02-18 MED ORDER — LEVOTHYROXINE SODIUM 88 MCG PO TABS: 88.0000 ug | ORAL_TABLET | Freq: Every day | ORAL | 0 refills | Status: DC

## 2021-02-18 NOTE — Addendum Note (Signed)
Addended by: [name] D on: 02/18/2021 08:33 AM   Modules accepted: Orders

## 2021-02-18 NOTE — Telephone Encounter (Signed)
Meds ordered this encounter  Medications   levothyroxine (SYNTHROID) 88 MCG tablet    Sig: Take 1 tablet (88 mcg total) by mouth daily before breakfast.    Dispense:  90 tablet    Refill:  0     Plan to recheck her blood work in 6 to 8 weeks have her schedule follow-up with me around that time, so we can go over the results and see how she is feeling on the adjustment.

## 2021-03-03 ENCOUNTER — Ambulatory Visit (INDEPENDENT_AMBULATORY_CARE_PROVIDER_SITE_OTHER): Payer: PPO | Admitting: Sports Medicine

## 2021-03-03 DIAGNOSIS — Q667 Congenital pes cavus, unspecified foot: Secondary | ICD-10-CM | POA: Diagnosis not present

## 2021-03-03 NOTE — Progress Notes (Signed)
    Procedures performed today:    None.  Independent interpretation of notes and tests performed by another provider:   None.  Brief History, Exam, Impression, and Recommendations:    Pes cavus This is a pleasant 65 year old female, we built her set of custom orthotics back in 2020, she has been doing well, she had a few visit with the podiatrist for some fifth ray pain, she had a lateral wedge placed in the orthotic which worked relatively well, more recently she has started to have pain along the right first metatarsal, tenderness is directly on the bottom of the first metatarsal, but not along the plantar fascia. We added an additional scaphoid pad and a first metatarsal ray post to the right orthotic, return to see me in a month.    ___________________________________________ Gwen Her. Dianah Field, M.D., ABFM., CAQSM. Primary Care and [place] Instructor of [place] [facility]

## 2021-03-03 NOTE — Assessment & Plan Note (Signed)
This is a pleasant 65 year old female, we built her set of custom orthotics back in 2020, she has been doing well, she had a few visit with the podiatrist for some fifth ray pain, she had a lateral wedge placed in the orthotic which worked relatively well, more recently she has started to have pain along the right first metatarsal, tenderness is directly on the bottom of the first metatarsal, but not along the plantar fascia. We added an additional scaphoid pad and a first metatarsal ray post to the right orthotic, return to see me in a month.

## 2021-03-31 ENCOUNTER — Ambulatory Visit: Payer: PPO | Admitting: Sports Medicine

## 2021-04-07 DIAGNOSIS — M7731 Calcaneal spur, right foot: Secondary | ICD-10-CM | POA: Diagnosis not present

## 2021-04-07 DIAGNOSIS — M71571 Other bursitis, not elsewhere classified, right ankle and foot: Secondary | ICD-10-CM | POA: Diagnosis not present

## 2021-04-07 DIAGNOSIS — M7732 Calcaneal spur, left foot: Secondary | ICD-10-CM | POA: Diagnosis not present

## 2021-04-07 DIAGNOSIS — M79672 Pain in left foot: Secondary | ICD-10-CM | POA: Diagnosis not present

## 2021-04-07 DIAGNOSIS — M79671 Pain in right foot: Secondary | ICD-10-CM | POA: Diagnosis not present

## 2021-04-07 DIAGNOSIS — M722 Plantar fascial fibromatosis: Secondary | ICD-10-CM | POA: Diagnosis not present

## 2021-04-11 ENCOUNTER — Encounter: Payer: Self-pay | Admitting: Family Medicine

## 2021-04-11 ENCOUNTER — Telehealth: Payer: Self-pay | Admitting: Family Medicine

## 2021-04-11 DIAGNOSIS — E039 Hypothyroidism, unspecified: Secondary | ICD-10-CM

## 2021-04-11 NOTE — Telephone Encounter (Signed)
Pt called. She states Dr. Madilyn Fireman just wants to rechk her bloodwork(6 wks check) and she did not need an appointment.  Please advise.

## 2021-04-11 NOTE — Telephone Encounter (Signed)
Orders Placed This Encounter  Procedures   TSH    

## 2021-04-12 NOTE — Telephone Encounter (Signed)
MyChart message sent to patient.

## 2021-04-13 DIAGNOSIS — E039 Hypothyroidism, unspecified: Secondary | ICD-10-CM | POA: Diagnosis not present

## 2021-04-14 LAB — TSH: TSH: 0.24 mIU/L — ABNORMAL LOW (ref 0.40–4.50)

## 2021-04-15 ENCOUNTER — Encounter: Payer: Self-pay | Admitting: Family Medicine

## 2021-04-15 DIAGNOSIS — E039 Hypothyroidism, unspecified: Secondary | ICD-10-CM

## 2021-04-15 MED ORDER — LIOTHYRONINE SODIUM 25 MCG PO TABS: ORAL_TABLET | ORAL | 1 refills | Status: DC

## 2021-04-15 MED ORDER — LEVOTHYROXINE SODIUM 88 MCG PO TABS: 88.0000 ug | ORAL_TABLET | Freq: Every day | ORAL | 0 refills | Status: DC

## 2021-04-15 NOTE — Progress Notes (Signed)
HI Sheila Harris, Your TSH is OK. We may need to make an adjustment.  Please verify how you are taking your meciation.

## 2021-04-15 NOTE — Telephone Encounter (Signed)
Spoke w/pt she is taking Levothyroxine 88 mcg and,  1/2 tablet of Cytomel 25 mcg (12.5) daily no missed doses.   If calling in a new prescription send to [facility].

## 2021-04-15 NOTE — Telephone Encounter (Signed)
Meds ordered this encounter  Medications   levothyroxine (SYNTHROID) 88 MCG tablet    Sig: Take 1 tablet (88 mcg total) by mouth daily before breakfast. , excdept 1/2 tab on Sundays    Dispense:  90 tablet    Refill:  0   liothyronine (CYTOMEL) 25 MCG tablet    Sig: TAKE HALF A TABLET BY MOUTH DAILY    Dispense:  45 tablet    Refill:  1

## 2021-05-02 ENCOUNTER — Encounter: Payer: Self-pay | Admitting: Family Medicine

## 2021-05-05 ENCOUNTER — Encounter: Payer: Self-pay | Admitting: Sports Medicine

## 2021-05-05 ENCOUNTER — Other Ambulatory Visit: Payer: Self-pay

## 2021-05-05 ENCOUNTER — Ambulatory Visit (INDEPENDENT_AMBULATORY_CARE_PROVIDER_SITE_OTHER): Payer: PPO

## 2021-05-05 ENCOUNTER — Ambulatory Visit (INDEPENDENT_AMBULATORY_CARE_PROVIDER_SITE_OTHER): Payer: PPO | Admitting: Sports Medicine

## 2021-05-05 DIAGNOSIS — S4392XA Sprain of unspecified parts of left shoulder girdle, initial encounter: Secondary | ICD-10-CM | POA: Diagnosis not present

## 2021-05-05 DIAGNOSIS — M25512 Pain in left shoulder: Secondary | ICD-10-CM | POA: Insufficient documentation

## 2021-05-05 DIAGNOSIS — R079 Chest pain, unspecified: Secondary | ICD-10-CM

## 2021-05-05 MED ORDER — PREDNISONE 20 MG PO TABS: ORAL_TABLET | ORAL | 0 refills | Status: DC

## 2021-05-05 NOTE — Progress Notes (Signed)
    Procedures performed today:    None.  Independent interpretation of notes and tests performed by another provider:   None.  Brief History, Exam, Impression, and Recommendations:    Sprain of left shoulder girdle This is a very pleasant 65 year old female, she has been doing a lot of working out in the gym in the hopes of improving her bone mineral density. Unfortunately she has started to have discomfort left shoulder, around the scapula as well as pectoralis in the front, the only maneuver that really reproduces her pain is resisted scapular elevation and retraction. Though she does feel some discomfort anteriorly as well. Lungs are clear, no shortness of breath or other cardiopulmonary symptoms, adding a chest x-ray, 5 days of low-dose prednisone, shoulder girdle conditioning, she will avoid upper body exercises until she sees me back in about a month.    ___________________________________________ Gwen Her. Dianah Field, M.D., ABFM., CAQSM. Primary Care and [place] Instructor of [place] [facility]

## 2021-05-05 NOTE — Assessment & Plan Note (Signed)
This is a very pleasant 65 year old female, she has been doing a lot of working out in the gym in the hopes of improving her bone mineral density. Unfortunately she has started to have discomfort left shoulder, around the scapula as well as pectoralis in the front, the only maneuver that really reproduces her pain is resisted scapular elevation and retraction. Though she does feel some discomfort anteriorly as well. Lungs are clear, no shortness of breath or other cardiopulmonary symptoms, adding a chest x-ray, 5 days of low-dose prednisone, shoulder girdle conditioning, she will avoid upper body exercises until she sees me back in about a month.

## 2021-05-19 ENCOUNTER — Encounter: Payer: Self-pay | Admitting: Family Medicine

## 2021-05-19 NOTE — Telephone Encounter (Signed)
Faxed request for eye exam to [facility] in [place].  Will await report.  Charyl Bigger, CMA

## 2021-05-20 ENCOUNTER — Encounter: Payer: Self-pay | Admitting: Sports Medicine

## 2021-05-31 ENCOUNTER — Ambulatory Visit: Payer: PPO | Admitting: Sports Medicine

## 2021-06-02 ENCOUNTER — Ambulatory Visit: Payer: PPO | Admitting: Sports Medicine

## 2021-06-08 ENCOUNTER — Encounter: Payer: Self-pay | Admitting: Family Medicine

## 2021-06-10 NOTE — Telephone Encounter (Signed)
Reports send to scanning. Can we see if they can fax it to [place] again.

## 2021-06-14 NOTE — Telephone Encounter (Signed)
LVM with [facility] on [address] to return call.  Charyl Bigger, CMA

## 2021-06-27 ENCOUNTER — Ambulatory Visit: Payer: PPO | Admitting: Sports Medicine

## 2021-06-29 ENCOUNTER — Encounter: Payer: Self-pay | Admitting: Family Medicine

## 2021-06-29 DIAGNOSIS — H353130 Nonexudative age-related macular degeneration, bilateral, stage unspecified: Secondary | ICD-10-CM

## 2021-06-29 NOTE — Telephone Encounter (Signed)
See scanned document from June.  It looks like she will need a formal referral to an ophthalmologist which is an [place] eye doctor.  Please place referral per patient's preference of provider and or location.  Use diagnosis of macular degeneration dry

## 2021-06-29 NOTE — Telephone Encounter (Signed)
Please see prior note.  The records were sent to scanning.  As far as I can tell they were never actually scanned in.   It looks like we had tried to call the eye office again on December 27 to see if they would fax those records it to [place] again.  we never heard back from them, so we can call the eye office again and see if they could fax them over today so I can respond to this for patient that would be wonderful.  Thank you

## 2021-07-06 ENCOUNTER — Other Ambulatory Visit: Payer: Self-pay | Admitting: Family Medicine

## 2021-07-11 ENCOUNTER — Encounter: Payer: Self-pay | Admitting: Family Medicine

## 2021-07-11 ENCOUNTER — Other Ambulatory Visit: Payer: Self-pay

## 2021-07-11 ENCOUNTER — Other Ambulatory Visit (HOSPITAL_COMMUNITY)
Admission: RE | Admit: 2021-07-11 | Discharge: 2021-07-11 | Disposition: A | Discharge status: Home / Self Care | Payer: PPO | Source: Ambulatory Visit | Attending: Family Medicine | Admitting: Family Medicine

## 2021-07-11 ENCOUNTER — Ambulatory Visit (INDEPENDENT_AMBULATORY_CARE_PROVIDER_SITE_OTHER): Payer: PPO

## 2021-07-11 ENCOUNTER — Ambulatory Visit (INDEPENDENT_AMBULATORY_CARE_PROVIDER_SITE_OTHER): Payer: PPO | Admitting: Family Medicine

## 2021-07-11 VITALS — BP 122/79 | HR 75 | Resp 16 | Ht 66.0 in | Wt 134.0 lb

## 2021-07-11 DIAGNOSIS — E039 Hypothyroidism, unspecified: Secondary | ICD-10-CM | POA: Diagnosis not present

## 2021-07-11 DIAGNOSIS — R0602 Shortness of breath: Secondary | ICD-10-CM | POA: Diagnosis not present

## 2021-07-11 DIAGNOSIS — Z0000 Encounter for general adult medical examination without abnormal findings: Secondary | ICD-10-CM | POA: Diagnosis not present

## 2021-07-11 DIAGNOSIS — Z01419 Encounter for gynecological examination (general) (routine) without abnormal findings: Secondary | ICD-10-CM | POA: Insufficient documentation

## 2021-07-11 DIAGNOSIS — E7800 Pure hypercholesterolemia, unspecified: Secondary | ICD-10-CM | POA: Diagnosis not present

## 2021-07-11 DIAGNOSIS — Z1151 Encounter for screening for human papillomavirus (HPV): Secondary | ICD-10-CM | POA: Diagnosis not present

## 2021-07-11 DIAGNOSIS — E559 Vitamin D deficiency, unspecified: Secondary | ICD-10-CM | POA: Diagnosis not present

## 2021-07-11 DIAGNOSIS — R059 Cough, unspecified: Secondary | ICD-10-CM

## 2021-07-11 NOTE — Addendum Note (Signed)
Addended by: Izora Gala on: 07/11/2021 02:14 PM   Modules accepted: Orders

## 2021-07-11 NOTE — Progress Notes (Signed)
Subjective:     Sheila Harris is a 66 y.o. female and is here for a comprehensive physical exam. The patient reports no problems.  Doing well overall.  She stays pretty active.  Will be her last Pap smear hopefully today she is not have any pelvic pain or problems.  Mammograms up-to-date.  Due for updated yearly labs.  He has had a cough that feels much more like a tickling cough in the back of her throat for about a month.  She is also noticed that she is a little bit more short of breath when she walks for exercise and gets to the top of the hill she is much more winded than she used to that has also been going on for about a month as well.  Social History   Socioeconomic History   Marital status: Married    Spouse name: Not on file   Number of children: 1   Years of education: Not on file   Highest education level: Not on file  Occupational History   Occupation: Retired  Tobacco Use   Smoking status: Never   Smokeless tobacco: Never  Substance and Sexual Activity   Alcohol use: Yes    Alcohol/week: 5.0 standard drinks    Types: 5 Standard drinks or equivalent per week    Comment: wine 1 glass 5 days a week   Drug use: Never   Sexual activity: Not Currently    Partners: Male  Other Topics Concern   Not on file  Social History Narrative   Walk 5 days a week. Gym/weights twice a week. Caffeine 1 cup of green tea a day    Social Determinants of Health   Financial Resource Strain: Not on file  Food Insecurity: No Food Insecurity   Worried About Charity fundraiser in the Last Year: Never true   Ran Out of Food in the Last Year: Never true  Transportation Needs: Not on file  Physical Activity: Not on file  Stress: Not on file  Social Connections: Not on file  Intimate Partner Violence: Not on file   Health Maintenance  Topic Date Due   Hepatitis C Screening  Never done   Zoster Vaccines- Shingrix (1 of 2) 10/09/2021 (Originally 12/09/2004)   Pneumonia Vaccine 77+ Years old  (1 - PCV) 07/11/2022 (Originally 12/10/2019)   TETANUS/TDAP  04/19/2022   MAMMOGRAM  02/09/2023   COLONOSCOPY (Pts 45-22yrs Insurance coverage will need to be confirmed)  11/23/2024   INFLUENZA VACCINE  Completed   DEXA SCAN  Completed   COVID-19 Vaccine  Completed   HPV VACCINES  Aged Out    The following portions of the patient's history were reviewed and updated as appropriate: allergies, current medications, past family history, past medical history, past social history, past surgical history, and problem list.  Review of Systems A comprehensive review of systems was negative.   Objective:    BP 122/79    Pulse 75    Resp 16    Ht 5\' 6"  (1.676 m)    Wt 134 lb (60.8 kg)    LMP 04/03/2010    SpO2 97%    BMI 21.63 kg/m  General appearance: alert, cooperative, and appears stated age Head: Normocephalic, without obvious abnormality, atraumatic Eyes:  conj clear, EOMi, PEERLA Ears: normal TM's and external ear canals both ears Nose: Nares normal. Septum midline. Mucosa normal. No drainage or sinus tenderness. Throat: lips, mucosa, and tongue normal; teeth and gums normal Neck: no  adenopathy, no carotid bruit, no JVD, supple, symmetrical, trachea midline, and thyroid not enlarged, symmetric, no tenderness/mass/nodules Back: symmetric, no curvature. ROM normal. No CVA tenderness. Lungs:  Clear to auscultation on the left but I am hearing some wheezing on the right. Breasts: normal appearance, no masses or tenderness Heart: regular rate and rhythm, S1, S2 normal, no murmur, click, rub or gallop Abdomen: soft, non-tender; bowel sounds normal; no masses,  no organomegaly Pelvic: cervix normal in appearance, external genitalia normal, no adnexal masses or tenderness, no cervical motion tenderness, rectovaginal septum normal, uterus normal size, shape, and consistency, and vagina normal without discharge Extremities: extremities normal, atraumatic, no cyanosis or edema Pulses: 2+ and  symmetric Skin: Skin color, texture, turgor normal. No rashes or lesions Lymph nodes: Cervical, supraclavicular, and axillary nodes normal. Neurologic: Grossly normal    Assessment:    Healthy female exam.      Plan:     See After Visit Summary for Counseling Recommendations  Keep up a regular exercise program and make sure you are eating a healthy diet Try to eat 4 servings of dairy a day, or if you are lactose intolerant take a calcium with vitamin D daily.  Your vaccines are up to date.   Shortness of breath with abnormal chest exam today.  Recommend chest x-ray for further work-up.  If normal chest x-ray then consider spirometry or PFTs for further evaluation.  I am more concerned that she is short of breath with activity.  I am still hearing a little bit of wheezing on that right side of her chest.

## 2021-07-11 NOTE — Progress Notes (Signed)
Lungs look clear no worrisome findings there.  They said that the cardiac shadow looked a little bit big but they also said that could just be because of the shape of your chest.

## 2021-07-11 NOTE — Progress Notes (Signed)
Hi Rosanne, labs look good so far.

## 2021-07-12 ENCOUNTER — Ambulatory Visit (INDEPENDENT_AMBULATORY_CARE_PROVIDER_SITE_OTHER): Payer: PPO | Admitting: Sports Medicine

## 2021-07-12 ENCOUNTER — Encounter: Payer: Self-pay | Admitting: Family Medicine

## 2021-07-12 DIAGNOSIS — M25512 Pain in left shoulder: Secondary | ICD-10-CM

## 2021-07-12 DIAGNOSIS — E039 Hypothyroidism, unspecified: Secondary | ICD-10-CM

## 2021-07-12 DIAGNOSIS — G8929 Other chronic pain: Secondary | ICD-10-CM | POA: Diagnosis not present

## 2021-07-12 DIAGNOSIS — M79671 Pain in right foot: Secondary | ICD-10-CM

## 2021-07-12 LAB — CYTOLOGY - PAP
Adequacy: ABSENT
Comment: NEGATIVE
Diagnosis: NEGATIVE
High risk HPV: NEGATIVE

## 2021-07-12 MED ORDER — CELECOXIB 100 MG PO CAPS: 100.0000 mg | ORAL_CAPSULE | Freq: Two times a day (BID) | ORAL | 3 refills | Status: DC

## 2021-07-12 NOTE — Assessment & Plan Note (Signed)
Sheila Harris has had had several months of right foot pain localized over the third and fourth metatarsal shafts, she had x-rays a couple of months ago with her podiatrist that were unrevealing. On exam she has tenderness over the shaft directly, she does have custom orthotics. Shoes are in good condition. Considering duration of pain and failure of conservative treatment for greater than 6 weeks with unrevealing x-rays we will proceed with MRI of her right foot looking for stress fracture. She has a boot at home that she will wear in the meantime.

## 2021-07-12 NOTE — Assessment & Plan Note (Signed)
Body continues to have pain in her left shoulder with radiation into the left upper chest, she is also endorsing some discomfort in her epigastrium. She does have history of PUD, will use low-dose Celebrex, I am now starting to think her pain is coming more from her cervical spine so we will get a cervical spine x-ray. She was having some midepigastric pain, she shot down all of my ideas for what this might be so we will just watch it for now.

## 2021-07-12 NOTE — Progress Notes (Signed)
Hi Talyia, thyroid looks good.

## 2021-07-12 NOTE — Progress Notes (Signed)
° ° °  Procedures performed today:    None.  Independent interpretation of notes and tests performed by another provider:   None.  Brief History, Exam, Impression, and Recommendations:    Chronic foot pain, right Tamie has had had several months of right foot pain localized over the third and fourth metatarsal shafts, she had x-rays a couple of months ago with her podiatrist that were unrevealing. On exam she has tenderness over the shaft directly, she does have custom orthotics. Shoes are in good condition. Considering duration of pain and failure of conservative treatment for greater than 6 weeks with unrevealing x-rays we will proceed with MRI of her right foot looking for stress fracture. She has a boot at home that she will wear in the meantime.  Left shoulder pain Body continues to have pain in her left shoulder with radiation into the left upper chest, she is also endorsing some discomfort in her epigastrium. She does have history of PUD, will use low-dose Celebrex, I am now starting to think her pain is coming more from her cervical spine so we will get a cervical spine x-ray. She was having some midepigastric pain, she shot down all of my ideas for what this might be so we will just watch it for now.    ___________________________________________ Gwen Her. Dianah Field, M.D., ABFM., CAQSM. Primary Care and [place] Instructor of [employer] [facility]

## 2021-07-12 NOTE — Telephone Encounter (Signed)
Since labs and chest x-ray are normal I would like for her to consider a stress test on the treadmill.  If she is okay with that we can get her set up.  I think if she is getting in some protein with each meal then she is doing well.

## 2021-07-13 ENCOUNTER — Encounter: Payer: Self-pay | Admitting: Family Medicine

## 2021-07-13 MED ORDER — LEVOTHYROXINE SODIUM 75 MCG PO TABS: 75.0000 ug | ORAL_TABLET | Freq: Every day | ORAL | 0 refills | Status: DC

## 2021-07-13 NOTE — Telephone Encounter (Signed)
See separate note for thyroid.  In regards to the stress test we can cancel that order.  We will have to let them know I did want to personally have a great way to remove it once it is already in there.  We will have to order a nuclear scan and that would have to be done by cardiologist so we will have to refer her to cardiology to have that ordered.

## 2021-07-13 NOTE — Telephone Encounter (Signed)
Orders Placed This Encounter  Procedures   Cardiac Stress Test: Informed Consent Details: Physician/Practitioner Attestation; Transcribe to consent form and obtain patient signature    Order Specific Question:   Physician/Practitioner attestation of informed consent for procedure/surgical case    Answer:   I, the physician/practitioner, attest that I have discussed with the patient the benefits, risks, side effects, alternatives, likelihood of achieving goals and potential problems during recovery for the procedure that I have provided informed consent.    Order Specific Question:   Procedure    Answer:   ETT    Order Specific Question:   Indication/Reason    Answer:   sob with acitivity. neg pulm w/u thus far   Exercise Tolerance Test    Standing Status:   Future    Standing Expiration Date:   07/13/2022    Order Specific Question:   Where should this test be performed    Answer:   [facility])    Order Specific Question:   Stress with Dobutamine or Treadmill with exercise?    Answer:   Treadmill w/ exercise

## 2021-07-13 NOTE — Progress Notes (Signed)
Your Pap smear is normal. You are negative for HPV as well.

## 2021-07-16 ENCOUNTER — Ambulatory Visit (INDEPENDENT_AMBULATORY_CARE_PROVIDER_SITE_OTHER): Payer: PPO

## 2021-07-16 ENCOUNTER — Other Ambulatory Visit: Payer: Self-pay

## 2021-07-16 DIAGNOSIS — M19071 Primary osteoarthritis, right ankle and foot: Secondary | ICD-10-CM | POA: Diagnosis not present

## 2021-07-16 DIAGNOSIS — G8929 Other chronic pain: Secondary | ICD-10-CM

## 2021-07-16 DIAGNOSIS — R600 Localized edema: Secondary | ICD-10-CM | POA: Diagnosis not present

## 2021-07-16 DIAGNOSIS — M79671 Pain in right foot: Secondary | ICD-10-CM | POA: Diagnosis not present

## 2021-07-17 LAB — CBC
HCT: 39.2 % (ref 35.0–45.0)
Hemoglobin: 11.8 g/dL (ref 11.7–15.5)
MCH: 25.6 pg — ABNORMAL LOW (ref 27.0–33.0)
MCHC: 30.1 g/dL — ABNORMAL LOW (ref 32.0–36.0)
MCV: 85 fL (ref 80.0–100.0)
MPV: 11.5 fL (ref 7.5–12.5)
Platelets: 332 10*3/uL (ref 140–400)
RBC: 4.61 10*6/uL (ref 3.80–5.10)
RDW: 13.9 % (ref 11.0–15.0)
WBC: 6.7 10*3/uL (ref 3.8–10.8)

## 2021-07-17 LAB — COMPLETE METABOLIC PANEL WITH GFR
AG Ratio: 2.4 (calc) (ref 1.0–2.5)
ALT: 20 U/L (ref 6–29)
AST: 23 U/L (ref 10–35)
Albumin: 4.6 g/dL (ref 3.6–5.1)
Alkaline phosphatase (APISO): 65 U/L (ref 37–153)
BUN: 14 mg/dL (ref 7–25)
CO2: 31 mmol/L (ref 20–32)
Calcium: 9.4 mg/dL (ref 8.6–10.4)
Chloride: 106 mmol/L (ref 98–110)
Creat: 0.85 mg/dL (ref 0.50–1.05)
Globulin: 1.9 g/dL (calc) (ref 1.9–3.7)
Glucose, Bld: 85 mg/dL (ref 65–99)
Potassium: 4.8 mmol/L (ref 3.5–5.3)
Sodium: 141 mmol/L (ref 135–146)
Total Bilirubin: 0.5 mg/dL (ref 0.2–1.2)
Total Protein: 6.5 g/dL (ref 6.1–8.1)
eGFR: 76 mL/min/{1.73_m2} (ref >=60)

## 2021-07-17 LAB — LIPID PANEL W/REFLEX DIRECT LDL
Cholesterol: 173 mg/dL (ref <200)
HDL: 58 mg/dL (ref >=50)
LDL Cholesterol (Calc): 98 mg/dL (calc)
Non-HDL Cholesterol (Calc): 115 mg/dL (calc) (ref <130)
Total CHOL/HDL Ratio: 3 (calc) (ref <5.0)
Triglycerides: 78 mg/dL (ref <150)

## 2021-07-17 LAB — VITAMIN D 1,25 DIHYDROXY
Vitamin D 1, 25 (OH)2 Total: 63 pg/mL (ref 18–72)
Vitamin D2 1, 25 (OH)2: <8 pg/mL
Vitamin D3 1, 25 (OH)2: 63 pg/mL

## 2021-07-17 LAB — TSH: TSH: 0.44 mIU/L (ref 0.40–4.50)

## 2021-07-17 LAB — T3, FREE: T3, Free: 5.6 pg/mL — ABNORMAL HIGH (ref 2.3–4.2)

## 2021-07-17 LAB — T4, FREE: Free T4: 1.2 ng/dL (ref 0.8–1.8)

## 2021-07-18 ENCOUNTER — Encounter: Payer: Self-pay | Admitting: Sports Medicine

## 2021-07-20 DIAGNOSIS — M79671 Pain in right foot: Secondary | ICD-10-CM | POA: Diagnosis not present

## 2021-07-25 DIAGNOSIS — H25813 Combined forms of age-related cataract, bilateral: Secondary | ICD-10-CM | POA: Diagnosis not present

## 2021-07-25 DIAGNOSIS — H353132 Nonexudative age-related macular degeneration, bilateral, intermediate dry stage: Secondary | ICD-10-CM | POA: Diagnosis not present

## 2021-07-25 DIAGNOSIS — H35363 Drusen (degenerative) of macula, bilateral: Secondary | ICD-10-CM | POA: Diagnosis not present

## 2021-07-25 DIAGNOSIS — H35453 Secondary pigmentary degeneration, bilateral: Secondary | ICD-10-CM | POA: Diagnosis not present

## 2021-07-26 ENCOUNTER — Encounter: Payer: Self-pay | Admitting: Family Medicine

## 2021-07-28 ENCOUNTER — Encounter: Payer: Self-pay | Admitting: Family Medicine

## 2021-07-28 NOTE — Progress Notes (Unsigned)
Referring-Catherine Metheney MD Reason for referral-dyspnea  HPI: 66 year old female for evaluation of dyspnea at request of [name] MD. Chest x-ray January 2023 showed mild cardiac enlargement.  Laboratories January 2023 showed normal TSH and free T4, hemoglobin 11.8, creatinine 0.85.  Current Outpatient Medications  Medication Sig Dispense Refill   alendronate (FOSAMAX) 70 MG tablet Take 1 tablet (70 mg total) by mouth once a week. Take with a full glass of water on an empty stomach. 12 tablet 3   AMBULATORY NON FORMULARY MEDICATION apply 2 clicks twice daily to inner thighs     ascorbic acid (VITAMIN C) 100 MG tablet Take by mouth.     b complex vitamins tablet Take by mouth.     CALCIUM PO Take 1,500 mg by mouth daily.     celecoxib (CELEBREX) 100 MG capsule Take 1 capsule (100 mg total) by mouth 2 (two) times daily with a meal. 60 capsule 3   clonazePAM (KLONOPIN) 0.5 MG tablet 1/2 TABLET AT BEDTIME AS NEEDED FOR ANXIETY ORALLY 30 DAYS 10 tablet 1   levothyroxine (SYNTHROID) 75 MCG tablet Take 1 tablet (75 mcg total) by mouth daily before breakfast. , excdept 1/2 tab on Sundays 90 tablet 0   liothyronine (CYTOMEL) 25 MCG tablet TAKE HALF A TABLET BY MOUTH DAILY 45 tablet 1   MAGNESIUM PO Take 1,000 mg by mouth daily.     Multiple Vitamin (THERA) TABS Take by mouth.     progesterone (PROMETRIUM) 100 MG capsule Take 1 capsule (100 mg total) by mouth at bedtime. NO REFILLS. OVERDUE FOR A VISIT. 90 capsule 0   valACYclovir (VALTREX) 500 MG tablet Take 1 tablet (500 mg total) by mouth 2 (two) times daily. 180 tablet 3   VITAMIN D PO Take 2,500 mg by mouth daily.     No current facility-administered medications for this visit.    No Known Allergies   Past Medical History:  Diagnosis Date   Genital herpes    Macular degeneration    Thyroid disease     No past surgical history on file.  Social History   Socioeconomic History   Marital status: Married    Spouse  name: Not on file   Number of children: 1   Years of education: Not on file   Highest education level: Not on file  Occupational History   Occupation: Retired  Tobacco Use   Smoking status: Never   Smokeless tobacco: Never  Substance and Sexual Activity   Alcohol use: Yes    Alcohol/week: 5.0 standard drinks    Types: 5 Standard drinks or equivalent per week    Comment: wine 1 glass 5 days a week   Drug use: Never   Sexual activity: Not Currently    Partners: Male  Other Topics Concern   Not on file  Social History Narrative   Walk 5 days a week. Gym/weights twice a week. Caffeine 1 cup of green tea a day    Social Determinants of Radio broadcast assistant Strain: Not on file  Food Insecurity: No Food Insecurity   Worried About Charity fundraiser in the Last Year: Never true   Ran Out of Food in the Last Year: Never true  Transportation Needs: Not on file  Physical Activity: Not on file  Stress: Not on file  Social Connections: Not on file  Intimate Partner Violence: Not on file    Family History  Problem Relation Age of Onset   Heart  disease Mother    Hypertension Mother    Stroke Mother    Breast cancer Sister    Healthy Sister     ROS: no fevers or chills, productive cough, hemoptysis, dysphasia, odynophagia, melena, hematochezia, dysuria, hematuria, rash, seizure activity, orthopnea, PND, pedal edema, claudication. Remaining systems are negative.  Physical Exam:   Last menstrual period 04/03/2010.  General:  Well developed/well nourished in NAD Skin warm/dry Patient not depressed No peripheral clubbing Back-normal HEENT-normal/normal eyelids Neck supple/normal carotid upstroke bilaterally; no bruits; no JVD; no thyromegaly chest - CTA/ normal expansion CV - RRR/normal S1 and S2; no murmurs, rubs or gallops;  PMI nondisplaced Abdomen -NT/ND, no HSM, no mass, + bowel sounds, no bruit 2+ femoral pulses, no bruits Ext-no edema, chords, 2+  DP Neuro-grossly nonfocal  ECG - personally reviewed  A/P  1 dyspnea on exertion-  Kirk Ruths, MD

## 2021-07-29 DIAGNOSIS — M25571 Pain in right ankle and joints of right foot: Secondary | ICD-10-CM | POA: Diagnosis not present

## 2021-07-29 DIAGNOSIS — M7741 Metatarsalgia, right foot: Secondary | ICD-10-CM | POA: Diagnosis not present

## 2021-07-29 DIAGNOSIS — M84374A Stress fracture, right foot, initial encounter for fracture: Secondary | ICD-10-CM | POA: Diagnosis not present

## 2021-08-03 NOTE — Telephone Encounter (Signed)
Please call their office for records and see if they can fax them over.  Patient has requested records from them 3 different times.  I know she is frustrated at this point.  So we can request them maybe that would be better so that we can make sure that they have the correct fax number.

## 2021-08-10 ENCOUNTER — Ambulatory Visit: Payer: PPO | Admitting: Cardiology

## 2021-08-10 DIAGNOSIS — M9905 Segmental and somatic dysfunction of pelvic region: Secondary | ICD-10-CM | POA: Diagnosis not present

## 2021-08-10 DIAGNOSIS — M9901 Segmental and somatic dysfunction of cervical region: Secondary | ICD-10-CM | POA: Diagnosis not present

## 2021-08-10 DIAGNOSIS — M9903 Segmental and somatic dysfunction of lumbar region: Secondary | ICD-10-CM | POA: Diagnosis not present

## 2021-08-10 DIAGNOSIS — M9902 Segmental and somatic dysfunction of thoracic region: Secondary | ICD-10-CM | POA: Diagnosis not present

## 2021-08-11 ENCOUNTER — Ambulatory Visit (INDEPENDENT_AMBULATORY_CARE_PROVIDER_SITE_OTHER): Payer: PPO | Admitting: Family Medicine

## 2021-08-11 ENCOUNTER — Other Ambulatory Visit: Payer: Self-pay

## 2021-08-11 ENCOUNTER — Encounter: Payer: Self-pay | Admitting: Family Medicine

## 2021-08-11 VITALS — BP 129/67 | HR 75 | Ht 66.0 in | Wt 133.0 lb

## 2021-08-11 DIAGNOSIS — F419 Anxiety disorder, unspecified: Secondary | ICD-10-CM | POA: Diagnosis not present

## 2021-08-11 DIAGNOSIS — R053 Chronic cough: Secondary | ICD-10-CM

## 2021-08-11 DIAGNOSIS — R079 Chest pain, unspecified: Secondary | ICD-10-CM | POA: Diagnosis not present

## 2021-08-11 MED ORDER — PANTOPRAZOLE SODIUM 40 MG PO TBEC: 40.0000 mg | DELAYED_RELEASE_TABLET | Freq: Every day | ORAL | 1 refills | Status: DC

## 2021-08-11 MED ORDER — CLONAZEPAM 0.5 MG PO TABS: ORAL_TABLET | ORAL | 1 refills | Status: DC

## 2021-08-11 NOTE — Progress Notes (Signed)
Established Patient Office Visit  Subjective:  Patient ID: Sheila Harris, female    DOB: Aug 18, 1955  Age: 66 y.o. MRN: [id number]  CC:  Chief Complaint  Patient presents with   Cough    HPI Sheila Harris presents for   Cough is been going on for at least a couple of months now.  She feels like it is mostly in the neck area and says it feels almost like a tickle.  Sometimes she will start to talk and then feels like she has to cough.  Occasionally she will feel a little throat irritation first thing in the morning.  She does not feel like she has any heartburn or reflux symptoms or a lot of postnasal drip.  She does wake up with little bit of congestion in the mornings and says she uses her nasal saline spray almost every morning.  She also wanted discussed that she is still having some intermittent chest pain it is in the lower portion of her chest and radiates around her left breast and towards her left side and even into the left upper quadrant.  It actually wraps all the way around to her back.  Sometimes she will just get back pain in that area just above the bra strap on the left side as well.  She says any type of strenuous physical activity will cause it to hurt.  Past Medical History:  Diagnosis Date   Genital herpes    Macular degeneration    Thyroid disease     No past surgical history on file.  Family History  Problem Relation Age of Onset   Heart disease Mother    Hypertension Mother    Stroke Mother    Breast cancer Sister    Healthy Sister     Social History   Socioeconomic History   Marital status: Married    Spouse name: Not on file   Number of children: 1   Years of education: Not on file   Highest education level: Not on file  Occupational History   Occupation: Retired  Tobacco Use   Smoking status: Never   Smokeless tobacco: Never  Substance and Sexual Activity   Alcohol use: Yes    Alcohol/week: 5.0 standard drinks    Types: 5 Standard drinks  or equivalent per week    Comment: wine 1 glass 5 days a week   Drug use: Never   Sexual activity: Not Currently    Partners: Male  Other Topics Concern   Not on file  Social History Narrative   Walk 5 days a week. Gym/weights twice a week. Caffeine 1 cup of green tea a day    Social Determinants of Radio broadcast assistant Strain: Not on file  Food Insecurity: No Food Insecurity   Worried About Charity fundraiser in the Last Year: Never true   Arboriculturist in the Last Year: Never true  Transportation Needs: Not on file  Physical Activity: Not on file  Stress: Not on file  Social Connections: Not on file  Intimate Partner Violence: Not on file    Outpatient Medications Prior to Visit  Medication Sig Dispense Refill   alendronate (FOSAMAX) 70 MG tablet Take 1 tablet (70 mg total) by mouth once a week. Take with a full glass of water on an empty stomach. 12 tablet 3   AMBULATORY NON FORMULARY MEDICATION apply 2 clicks twice daily to inner thighs     ascorbic  acid (VITAMIN C) 100 MG tablet Take by mouth.     b complex vitamins tablet Take by mouth.     CALCIUM PO Take 1,500 mg by mouth daily.     levothyroxine (SYNTHROID) 75 MCG tablet Take 1 tablet (75 mcg total) by mouth daily before breakfast. , excdept 1/2 tab on Sundays 90 tablet 0   liothyronine (CYTOMEL) 25 MCG tablet TAKE HALF A TABLET BY MOUTH DAILY 45 tablet 1   MAGNESIUM PO Take 1,000 mg by mouth daily.     Multiple Vitamin (THERA) TABS Take by mouth.     progesterone (PROMETRIUM) 100 MG capsule Take 1 capsule (100 mg total) by mouth at bedtime. NO REFILLS. OVERDUE FOR A VISIT. 90 capsule 0   valACYclovir (VALTREX) 500 MG tablet Take 1 tablet (500 mg total) by mouth 2 (two) times daily. 180 tablet 3   VITAMIN D PO Take 2,500 mg by mouth daily.     clonazePAM (KLONOPIN) 0.5 MG tablet 1/2 TABLET AT BEDTIME AS NEEDED FOR ANXIETY ORALLY 30 DAYS 10 tablet 1   celecoxib (CELEBREX) 100 MG capsule Take 1 capsule (100 mg  total) by mouth 2 (two) times daily with a meal. (Patient not taking: Reported on 08/11/2021) 60 capsule 3   No facility-administered medications prior to visit.    No Known Allergies  ROS Review of Systems    Objective:    Physical Exam Constitutional:      Appearance: She is well-developed.  HENT:     Head: Normocephalic and atraumatic.     Right Ear: Tympanic membrane, ear canal and external ear normal.     Left Ear: Tympanic membrane, ear canal and external ear normal.     Nose: Nose normal.     Mouth/Throat:     Mouth: Mucous membranes are moist.     Pharynx: Oropharynx is clear. No oropharyngeal exudate or posterior oropharyngeal erythema.  Eyes:     Conjunctiva/sclera: Conjunctivae normal.     Pupils: Pupils are equal, round, and reactive to light.  Neck:     Thyroid: No thyromegaly.  Cardiovascular:     Rate and Rhythm: Normal rate and regular rhythm.     Heart sounds: Normal heart sounds.  Pulmonary:     Effort: Pulmonary effort is normal.     Breath sounds: Normal breath sounds. No wheezing.  Musculoskeletal:     Cervical back: Neck supple.  Lymphadenopathy:     Cervical: No cervical adenopathy.  Skin:    General: Skin is warm and dry.  Neurological:     Mental Status: She is alert and oriented to person, place, and time.    BP 129/67    Pulse 75    Ht 5' 6"  (1.676 m)    Wt 133 lb (60.3 kg)    LMP 04/03/2010    SpO2 99%    BMI 21.47 kg/m  Wt Readings from Last 3 Encounters:  08/11/21 133 lb (60.3 kg)  07/11/21 134 lb (60.8 kg)  11/30/20 133 lb (60.3 kg)     Health Maintenance Due  Topic Date Due   Hepatitis C Screening  Never done    There are no preventive care reminders to display for this patient.  Lab Results  Component Value Date   TSH 0.44 07/11/2021   Lab Results  Component Value Date   WBC 6.7 07/11/2021   HGB 11.8 07/11/2021   HCT 39.2 07/11/2021   MCV 85.0 07/11/2021   PLT 332 07/11/2021   Lab  Results  Component Value Date    NA 141 07/11/2021   K 4.8 07/11/2021   CO2 31 07/11/2021   GLUCOSE 85 07/11/2021   BUN 14 07/11/2021   CREATININE 0.85 07/11/2021   BILITOT 0.5 07/11/2021   ALKPHOS 77 01/21/2020   AST 23 07/11/2021   ALT 20 07/11/2021   PROT 6.5 07/11/2021   ALBUMIN 4.3 01/21/2020   CALCIUM 9.4 07/11/2021   EGFR 76 07/11/2021   GFR 75.21 01/21/2020   Lab Results  Component Value Date   CHOL 173 07/11/2021   Lab Results  Component Value Date   HDL 58 07/11/2021   Lab Results  Component Value Date   LDLCALC 98 07/11/2021   Lab Results  Component Value Date   TRIG 78 07/11/2021   Lab Results  Component Value Date   CHOLHDL 3.0 07/11/2021   Lab Results  Component Value Date   HGBA1C 5.3 10/29/2019      Assessment & Plan:   Problem List Items Addressed This Visit       Other   Chronic cough - Primary    Chronic cough-she has had a normal chest x-ray chest exam is clear today.  The cough really is more in the upper airway.  We discussed that the top 2 causes for chronic cough and upper airway is GERD and postnasal drip.  Recommend a trial of a PPI for at least 2 to 3 weeks to see if this is helpful if it does not then we will put her on something for postnasal drip for 2 to 3 weeks and see if that is helpful in reducing her cough symptoms.  If neither is helpful then we can consider referral to ENT for further work-up.  Being that she does wake up with an irritated throat in the mornings and most suspicious of GERD.      Anxiety    Did go ahead and refill her clonazepam just encouraged her to continue to use sparingly.  She does usually split it when she uses it.      Other Visit Diagnoses     Anxiety disorder, unspecified       Relevant Medications   clonazePAM (KLONOPIN) 0.5 MG tablet   Left-sided chest pain           Left-sided chest pain-unclear etiology it seems to occur when she is doing a lot of bending and lifting so it sounds like it is musculoskeletal and it  radiates all the way around to her back at times.  Could be radiculopathy from the mid thoracic spine that is triggering with position change.  Unclear.  She has consulted with sports medicine about it and has had 2 chest x-rays that were unrevealing.  She is also had a normal mammogram.  Does not sound cardiac related and she is low risk for cardiac disease.  Meds ordered this encounter  Medications   clonazePAM (KLONOPIN) 0.5 MG tablet    Sig: 1/2 TABLET AT BEDTIME AS NEEDED FOR ANXIETY ORALLY 30 DAYS    Dispense:  10 tablet    Refill:  1    Not to exceed 5 additional fills before 05/01/2020 DX Code Needed  .   pantoprazole (PROTONIX) 40 MG tablet    Sig: Take 1 tablet (40 mg total) by mouth daily.    Dispense:  30 tablet    Refill:  1    Follow-up: No follow-ups on file.    [name], MD

## 2021-08-11 NOTE — Assessment & Plan Note (Signed)
Chronic cough-she has had a normal chest x-ray chest exam is clear today.  The cough really is more in the upper airway.  We discussed that the top 2 causes for chronic cough and upper airway is GERD and postnasal drip.  Recommend a trial of a PPI for at least 2 to 3 weeks to see if this is helpful if it does not then we will put her on something for postnasal drip for 2 to 3 weeks and see if that is helpful in reducing her cough symptoms.  If neither is helpful then we can consider referral to ENT for further work-up.  Being that she does wake up with an irritated throat in the mornings and most suspicious of GERD.

## 2021-08-11 NOTE — Assessment & Plan Note (Signed)
Did go ahead and refill her clonazepam just encouraged her to continue to use sparingly.  She does usually split it when she uses it.

## 2021-08-19 ENCOUNTER — Encounter: Payer: Self-pay | Admitting: Family Medicine

## 2021-08-19 NOTE — Telephone Encounter (Signed)
Sheila Harris, call for this.  I know you called a couple of weeks ago and I guess he was out of town and had finished his note before hand.  But that was probably 2 or 3 weeks ago and I have not seen anything yet so we can just have them fax it over I can take a look. ? ?Also, could you ask Margreta Journey with [facility] if they offer any genetic testing for macular degeneration call?  I suspect that they do not.  So she may be better off to get the testing done directly by Dr. Serita Grit office.  I am not sure why they are not just doing it anyway.  Especially if they already offer it. ?

## 2021-08-19 NOTE — Telephone Encounter (Signed)
I do not see anything when I type it into epic under [facility].  So if you could ask Margreta Journey if they offer any genetic testing for macular degeneration if not then I would recommend that she call Dr. Serita Grit office back directly and get scheduled with them for additional testing and evaluation. ?

## 2021-08-24 DIAGNOSIS — M9901 Segmental and somatic dysfunction of cervical region: Secondary | ICD-10-CM | POA: Diagnosis not present

## 2021-08-24 DIAGNOSIS — M9903 Segmental and somatic dysfunction of lumbar region: Secondary | ICD-10-CM | POA: Diagnosis not present

## 2021-08-24 DIAGNOSIS — M9905 Segmental and somatic dysfunction of pelvic region: Secondary | ICD-10-CM | POA: Diagnosis not present

## 2021-08-24 DIAGNOSIS — M9902 Segmental and somatic dysfunction of thoracic region: Secondary | ICD-10-CM | POA: Diagnosis not present

## 2021-09-06 ENCOUNTER — Other Ambulatory Visit: Payer: Self-pay

## 2021-09-06 ENCOUNTER — Encounter: Payer: Self-pay | Admitting: Family Medicine

## 2021-09-06 DIAGNOSIS — E039 Hypothyroidism, unspecified: Secondary | ICD-10-CM

## 2021-09-06 DIAGNOSIS — M810 Age-related osteoporosis without current pathological fracture: Secondary | ICD-10-CM

## 2021-09-08 DIAGNOSIS — E039 Hypothyroidism, unspecified: Secondary | ICD-10-CM | POA: Diagnosis not present

## 2021-09-09 LAB — TSH+FREE T4: TSH W/REFLEX TO FT4: 0.95 mIU/L (ref 0.40–4.50)

## 2021-09-09 LAB — T3, FREE: T3, Free: 3.7 pg/mL (ref 2.3–4.2)

## 2021-09-09 NOTE — Progress Notes (Signed)
Hi Shaida, thyroid level looks great this time.

## 2021-09-20 DIAGNOSIS — M84374D Stress fracture, right foot, subsequent encounter for fracture with routine healing: Secondary | ICD-10-CM | POA: Diagnosis not present

## 2021-09-21 ENCOUNTER — Ambulatory Visit: Payer: PPO | Admitting: Cardiology

## 2021-09-28 ENCOUNTER — Other Ambulatory Visit: Payer: Self-pay | Admitting: *Deleted

## 2021-09-28 ENCOUNTER — Encounter: Payer: Self-pay | Admitting: Family Medicine

## 2021-09-28 DIAGNOSIS — Z79890 Hormone replacement therapy (postmenopausal): Secondary | ICD-10-CM

## 2021-09-28 MED ORDER — AMBULATORY NON FORMULARY MEDICATION: 3 refills | Status: DC

## 2021-09-29 DIAGNOSIS — R2689 Other abnormalities of gait and mobility: Secondary | ICD-10-CM | POA: Diagnosis not present

## 2021-09-29 DIAGNOSIS — M25571 Pain in right ankle and joints of right foot: Secondary | ICD-10-CM | POA: Diagnosis not present

## 2021-10-05 DIAGNOSIS — M25571 Pain in right ankle and joints of right foot: Secondary | ICD-10-CM | POA: Diagnosis not present

## 2021-10-05 DIAGNOSIS — R2689 Other abnormalities of gait and mobility: Secondary | ICD-10-CM | POA: Diagnosis not present

## 2021-10-07 DIAGNOSIS — R2689 Other abnormalities of gait and mobility: Secondary | ICD-10-CM | POA: Diagnosis not present

## 2021-10-07 DIAGNOSIS — M25571 Pain in right ankle and joints of right foot: Secondary | ICD-10-CM | POA: Diagnosis not present

## 2021-10-10 DIAGNOSIS — M25571 Pain in right ankle and joints of right foot: Secondary | ICD-10-CM | POA: Diagnosis not present

## 2021-10-10 DIAGNOSIS — R2689 Other abnormalities of gait and mobility: Secondary | ICD-10-CM | POA: Diagnosis not present

## 2021-10-12 DIAGNOSIS — R2689 Other abnormalities of gait and mobility: Secondary | ICD-10-CM | POA: Diagnosis not present

## 2021-10-12 DIAGNOSIS — M25571 Pain in right ankle and joints of right foot: Secondary | ICD-10-CM | POA: Diagnosis not present

## 2021-10-17 DIAGNOSIS — R2689 Other abnormalities of gait and mobility: Secondary | ICD-10-CM | POA: Diagnosis not present

## 2021-10-17 DIAGNOSIS — M25571 Pain in right ankle and joints of right foot: Secondary | ICD-10-CM | POA: Diagnosis not present

## 2021-10-18 ENCOUNTER — Other Ambulatory Visit: Payer: Self-pay | Admitting: Family Medicine

## 2021-10-19 DIAGNOSIS — R2689 Other abnormalities of gait and mobility: Secondary | ICD-10-CM | POA: Diagnosis not present

## 2021-10-19 DIAGNOSIS — M25571 Pain in right ankle and joints of right foot: Secondary | ICD-10-CM | POA: Diagnosis not present

## 2021-10-20 DIAGNOSIS — M19071 Primary osteoarthritis, right ankle and foot: Secondary | ICD-10-CM | POA: Diagnosis not present

## 2021-10-20 DIAGNOSIS — M7741 Metatarsalgia, right foot: Secondary | ICD-10-CM | POA: Diagnosis not present

## 2021-10-20 DIAGNOSIS — M25571 Pain in right ankle and joints of right foot: Secondary | ICD-10-CM | POA: Diagnosis not present

## 2021-10-26 DIAGNOSIS — R2689 Other abnormalities of gait and mobility: Secondary | ICD-10-CM | POA: Diagnosis not present

## 2021-10-26 DIAGNOSIS — M25571 Pain in right ankle and joints of right foot: Secondary | ICD-10-CM | POA: Diagnosis not present

## 2021-11-01 DIAGNOSIS — R2689 Other abnormalities of gait and mobility: Secondary | ICD-10-CM | POA: Diagnosis not present

## 2021-11-01 DIAGNOSIS — M25571 Pain in right ankle and joints of right foot: Secondary | ICD-10-CM | POA: Diagnosis not present

## 2021-11-02 DIAGNOSIS — M19071 Primary osteoarthritis, right ankle and foot: Secondary | ICD-10-CM | POA: Diagnosis not present

## 2021-11-03 ENCOUNTER — Ambulatory Visit (INDEPENDENT_AMBULATORY_CARE_PROVIDER_SITE_OTHER): Payer: PPO | Admitting: Podiatry

## 2021-11-03 DIAGNOSIS — M79671 Pain in right foot: Secondary | ICD-10-CM

## 2021-11-04 NOTE — Progress Notes (Signed)
error 

## 2021-11-07 ENCOUNTER — Ambulatory Visit (INDEPENDENT_AMBULATORY_CARE_PROVIDER_SITE_OTHER): Payer: PPO | Admitting: Medical-Surgical

## 2021-11-07 ENCOUNTER — Encounter: Payer: Self-pay | Admitting: Medical-Surgical

## 2021-11-07 VITALS — BP 127/79 | HR 80 | Resp 20 | Ht 66.0 in | Wt 134.7 lb

## 2021-11-07 DIAGNOSIS — S00462A Insect bite (nonvenomous) of left ear, initial encounter: Secondary | ICD-10-CM | POA: Diagnosis not present

## 2021-11-07 DIAGNOSIS — W57XXXA Bitten or stung by nonvenomous insect and other nonvenomous arthropods, initial encounter: Secondary | ICD-10-CM | POA: Diagnosis not present

## 2021-11-07 MED ORDER — DOXYCYCLINE HYCLATE 100 MG PO TABS: 100.0000 mg | ORAL_TABLET | Freq: Two times a day (BID) | ORAL | 0 refills | Status: AC

## 2021-11-07 NOTE — Assessment & Plan Note (Signed)
Unknown duration of tick attachment.  Some cellulitic changes to the pinna with no areas of fluctuance.  On exam, it does appear that the tick was removed in its entirety.  We will treat for coverage of potential Lyme as well as cellulitis with doxycycline 100 mg twice daily x7 days.  She did have some worries about vaginal yeast infections from antibiotics so offered Diflucan.  She decided she would like to see what happens and will let us know if she needs this.

## 2021-11-07 NOTE — Progress Notes (Signed)
Acute Office Visit  Subjective:     Patient ID: Sheila Harris, female    DOB: [birth year], 66 y.o.   MRN: [id number]  Chief Complaint  Patient presents with   Tick Removal    HPI Patient is in today for evaluation of a tick bite on her left ear. Found the tick on her ear last night and was able to remove it. This morning, she noted that her left ear was swollen and red. It isn't very painful but is somewhat uncomfortable.   Review of Systems  Constitutional:  Negative for chills, fever and malaise/fatigue.  HENT:  Positive for ear pain.   Respiratory:  Negative for cough, shortness of breath and wheezing.   Cardiovascular:  Negative for chest pain, palpitations and leg swelling.  Neurological:  Negative for dizziness and headaches.  Psychiatric/Behavioral:  Negative for depression and suicidal ideas. The patient is not nervous/anxious and does not have insomnia.        Objective:    BP 127/79 (BP Location: Left Arm, Cuff Size: Normal)   Pulse 80   Resp 20   Ht '5\' 6"'$  (1.676 m)   Wt 134 lb 11.2 oz (61.1 kg)   LMP 04/03/2010   SpO2 97%   BMI 21.74 kg/m    Physical Exam Vitals reviewed.  Constitutional:      General: She is not in acute distress.    Appearance: Normal appearance. She is not ill-appearing.  HENT:     Head: Normocephalic and atraumatic.     Left Ear: Tenderness (Left pinna, erythematous and swollen) present.     Ears:   Cardiovascular:     Rate and Rhythm: Normal rate and regular rhythm.     Pulses: Normal pulses.     Heart sounds: Normal heart sounds. No murmur heard.   No friction rub. No gallop.  Pulmonary:     Effort: Pulmonary effort is normal. No respiratory distress.     Breath sounds: Normal breath sounds. No wheezing.  Skin:    General: Skin is warm and dry.  Neurological:     Mental Status: She is alert and oriented to person, place, and time.  Psychiatric:        Mood and Affect: Mood normal.        Behavior: Behavior normal.         Thought Content: Thought content normal.        Judgment: Judgment normal.   No results found for any visits on 11/07/21.     Assessment & Plan:   Problem List Items Addressed This Visit       Nervous and Auditory   Tick bite of left ear - Primary    Unknown duration of tick attachment.  Some cellulitic changes to the pinna with no areas of fluctuance.  On exam, it does appear that the tick was removed in its entirety.  We will treat for coverage of potential Lyme as well as cellulitis with doxycycline 100 mg twice daily x7 days.  She did have some worries about vaginal yeast infections from antibiotics so offered Diflucan.  She decided she would like to see what happens and will let us know if she needs this.        Meds ordered this encounter  Medications   doxycycline (VIBRA-TABS) 100 MG tablet    Sig: Take 1 tablet (100 mg total) by mouth 2 (two) times daily for 7 days.    Dispense:  14 tablet  Refill:  0    Order Specific Question:   Supervising Provider    Answer:   MATTHEWS, CODY [[id number]]    Return if symptoms worsen or fail to improve.  ___________________________________________ Clearnce Sorrel, DNP, APRN, FNP-BC [facility]

## 2021-11-09 DIAGNOSIS — M19071 Primary osteoarthritis, right ankle and foot: Secondary | ICD-10-CM | POA: Diagnosis not present

## 2021-11-12 DIAGNOSIS — M19071 Primary osteoarthritis, right ankle and foot: Secondary | ICD-10-CM | POA: Diagnosis not present

## 2021-11-16 DIAGNOSIS — M19071 Primary osteoarthritis, right ankle and foot: Secondary | ICD-10-CM | POA: Diagnosis not present

## 2021-11-21 DIAGNOSIS — M19071 Primary osteoarthritis, right ankle and foot: Secondary | ICD-10-CM | POA: Diagnosis not present

## 2021-11-28 DIAGNOSIS — M19071 Primary osteoarthritis, right ankle and foot: Secondary | ICD-10-CM | POA: Diagnosis not present

## 2021-12-07 DIAGNOSIS — M19071 Primary osteoarthritis, right ankle and foot: Secondary | ICD-10-CM | POA: Diagnosis not present

## 2021-12-08 ENCOUNTER — Encounter: Payer: Self-pay | Admitting: Family Medicine

## 2021-12-08 ENCOUNTER — Ambulatory Visit (INDEPENDENT_AMBULATORY_CARE_PROVIDER_SITE_OTHER): Payer: PPO | Admitting: Family Medicine

## 2021-12-08 VITALS — BP 124/73 | HR 84 | Resp 16 | Ht 66.0 in | Wt 135.0 lb

## 2021-12-08 DIAGNOSIS — E039 Hypothyroidism, unspecified: Secondary | ICD-10-CM | POA: Diagnosis not present

## 2021-12-08 DIAGNOSIS — H353130 Nonexudative age-related macular degeneration, bilateral, stage unspecified: Secondary | ICD-10-CM | POA: Diagnosis not present

## 2021-12-08 DIAGNOSIS — Z79890 Hormone replacement therapy (postmenopausal): Secondary | ICD-10-CM

## 2021-12-08 DIAGNOSIS — M818 Other osteoporosis without current pathological fracture: Secondary | ICD-10-CM

## 2021-12-08 DIAGNOSIS — R232 Flushing: Secondary | ICD-10-CM

## 2021-12-08 DIAGNOSIS — W57XXXA Bitten or stung by nonvenomous insect and other nonvenomous arthropods, initial encounter: Secondary | ICD-10-CM

## 2021-12-08 DIAGNOSIS — H353190 Nonexudative age-related macular degeneration, unspecified eye, stage unspecified: Secondary | ICD-10-CM | POA: Insufficient documentation

## 2021-12-08 DIAGNOSIS — S00462A Insect bite (nonvenomous) of left ear, initial encounter: Secondary | ICD-10-CM

## 2021-12-08 NOTE — Progress Notes (Signed)
Established Patient Office Visit  Subjective   Patient ID: Sheila Harris, female    DOB: 30-Jan-1956  Age: 66 y.o. MRN: [id number]  Chief Complaint  Patient presents with   Follow-up    Patient would like to discuss thyroid levels. Patient complains of feeling hot a lot and difficulty sleeping for 2 months.     HPI  Patient would like to discuss thyroid levels. Patient complains of feeling hot a lot and difficulty sleeping for 2 months.  She wonders if her thyroid could be off.  It was last checked in March and TSH at that time was 0.95.  She would like to have her hormone levels checked as well.  She also is concerned about a recent tick bite that she experienced on her left ear on May 22 she came in to be evaluated.  She was given a prescription for doxycycline she said she did end up filling it because a few days later she ended up running a fever she never developed a rash.  She would like to be checked for Lyme's disease if possible.  Follow-up osteoporosis-she continues to do resistance and weightbearing exercises.  She was previously on Forteo for about 2 years but more recently on Fosamax.  She did stop it recently because she felt like it was not really making a big difference in her bone density numbers.  She did up her calcium recently and feels like she is taking a more quality calcium supplement.  She still having some difficulty with her dry macular degeneration but is hopeful that it is stable.  She is mostly doing supplements at this point.  They have been able to avoid injections but that is an option.  He is also currently undergoing physical therapy for core strengthening for her back as she does have a history of right-sided chronic intermittent sciatica and is well as for her foot following up a metatarsal fracture.      ROS    Objective:     BP 124/73   Pulse 84   Resp 16   Ht '5\' 6"'$  (1.676 m)   Wt 135 lb (61.2 kg)   LMP 04/03/2010   SpO2 99%   BMI 21.79  kg/m    Physical Exam Vitals and nursing note reviewed.  Constitutional:      Appearance: She is well-developed.  HENT:     Head: Normocephalic and atraumatic.  Cardiovascular:     Rate and Rhythm: Normal rate and regular rhythm.     Heart sounds: Normal heart sounds.  Pulmonary:     Effort: Pulmonary effort is normal.     Breath sounds: Normal breath sounds.  Skin:    General: Skin is warm and dry.     Comments: Does have a little bit of crusty scab on the left upper outer ear where the tick bite was present.  No active infection  Neurological:     Mental Status: She is alert and oriented to person, place, and time.  Psychiatric:        Behavior: Behavior normal.      No results found for any visits on 12/08/21.    The 10-year ASCVD risk score (Arnett DK, et al., 2019) is: 5.3%    Assessment & Plan:   Problem List Items Addressed This Visit       Endocrine   Hypothyroidism    To reevaluate TSH levels today.      Relevant Orders   TSH  Progesterone   Estradiol   Follicle stimulating hormone   Luteinizing hormone   B. Burgdorfi Antibodies by WB     Nervous and Auditory   Tick bite of left ear    She did develop a fever afterwards.  Will screen for Lyme's disease.  No rash.      Relevant Orders   TSH   Progesterone   Estradiol   Follicle stimulating hormone   Luteinizing hormone   B. Burgdorfi Antibodies by WB     Musculoskeletal and Integument   Osteoporosis    Currently holding her Fosamax.  Due for bone density test next week.      Relevant Orders   TSH   Progesterone   Estradiol   Follicle stimulating hormone   Luteinizing hormone   B. Burgdorfi Antibodies by WB     Other   Hormone replacement therapy (HRT)   Relevant Orders   TSH   Progesterone   Estradiol   Follicle stimulating hormone   Luteinizing hormone   B. Burgdorfi Antibodies by WB   Dry age-related macular degeneration   Other Visit Diagnoses     Hot flashes    -   Primary   Relevant Orders   TSH   Progesterone   Estradiol   Follicle stimulating hormone   Luteinizing hormone   B. Burgdorfi Antibodies by WB      Hot flashes-we will check hormone levels as well as thyroid level. I spent 35 minutes on the day of the encounter to include pre-visit record review, face-to-face time with the patient and post visit ordering of test.    No follow-ups on file.    [name], MD

## 2021-12-08 NOTE — Assessment & Plan Note (Signed)
To reevaluate TSH levels today.

## 2021-12-08 NOTE — Assessment & Plan Note (Signed)
She did develop a fever afterwards.  Will screen for Lyme's disease.  No rash.

## 2021-12-08 NOTE — Assessment & Plan Note (Signed)
Currently holding her Fosamax.  Due for bone density test next week.

## 2021-12-12 DIAGNOSIS — M19071 Primary osteoarthritis, right ankle and foot: Secondary | ICD-10-CM | POA: Diagnosis not present

## 2021-12-13 ENCOUNTER — Encounter: Payer: Self-pay | Admitting: Family Medicine

## 2021-12-14 ENCOUNTER — Encounter: Payer: Self-pay | Admitting: Family Medicine

## 2021-12-14 ENCOUNTER — Ambulatory Visit (INDEPENDENT_AMBULATORY_CARE_PROVIDER_SITE_OTHER): Payer: PPO

## 2021-12-14 DIAGNOSIS — Z780 Asymptomatic menopausal state: Secondary | ICD-10-CM | POA: Diagnosis not present

## 2021-12-14 DIAGNOSIS — M19071 Primary osteoarthritis, right ankle and foot: Secondary | ICD-10-CM | POA: Diagnosis not present

## 2021-12-14 DIAGNOSIS — M810 Age-related osteoporosis without current pathological fracture: Secondary | ICD-10-CM

## 2021-12-14 LAB — B. BURGDORFI ANTIBODIES BY WB

## 2021-12-14 LAB — LUTEINIZING HORMONE: LH: 27 m[IU]/mL

## 2021-12-14 LAB — ESTRADIOL: Estradiol: 27 pg/mL

## 2021-12-14 LAB — PROGESTERONE: Progesterone: 16.4 ng/mL

## 2021-12-14 LAB — FOLLICLE STIMULATING HORMONE: FSH: 64 m[IU]/mL

## 2021-12-14 LAB — TSH: TSH: 1.97 mIU/L (ref 0.40–4.50)

## 2021-12-14 NOTE — Progress Notes (Signed)
Hi Sheila Harris, your bone density results show a T score of -3.8.  It actually looks a little bit better than 2021 when it was -4.1.  So there has been a slight improvement.

## 2021-12-14 NOTE — Progress Notes (Signed)
Hi Sheila Harris, no sign of Lyme's disease.  To be considered a positive test then you have to have at least 5 of the proteins in the results list.  And you only have 1 that was reactive so this is considered a negative test for Lyme's disease which is very reassuring.  They did recommend may be testing again in the future if symptoms persist as sometimes with early infection only 1 or 2 proteins can show positive.  But again if you are not experiencing any persistent symptoms and I would not recommend further testing.

## 2021-12-17 ENCOUNTER — Encounter: Payer: Self-pay | Admitting: Family Medicine

## 2021-12-22 DIAGNOSIS — M19071 Primary osteoarthritis, right ankle and foot: Secondary | ICD-10-CM | POA: Diagnosis not present

## 2021-12-25 ENCOUNTER — Other Ambulatory Visit: Payer: Self-pay | Admitting: Family Medicine

## 2021-12-25 DIAGNOSIS — E039 Hypothyroidism, unspecified: Secondary | ICD-10-CM

## 2021-12-26 ENCOUNTER — Encounter: Payer: Self-pay | Admitting: Family Medicine

## 2021-12-26 ENCOUNTER — Ambulatory Visit (INDEPENDENT_AMBULATORY_CARE_PROVIDER_SITE_OTHER): Payer: PPO | Admitting: Family Medicine

## 2021-12-26 DIAGNOSIS — Z0000 Encounter for general adult medical examination without abnormal findings: Secondary | ICD-10-CM

## 2021-12-26 DIAGNOSIS — Z1231 Encounter for screening mammogram for malignant neoplasm of breast: Secondary | ICD-10-CM

## 2021-12-26 NOTE — Patient Instructions (Addendum)
[place] Maintenance Summary and Written Plan of Care  Sheila Harris ,  Thank you for allowing me to perform your Medicare Annual Wellness Visit and for your ongoing commitment to your health.   Health Maintenance & Immunization History Health Maintenance  Topic Date Due   COVID-19 Vaccine (5 - Pfizer series) 01/11/2022 (Originally 07/04/2021)   Zoster Vaccines- Shingrix (1 of 2) 03/10/2022 (Originally 12/09/2004)   Pneumonia Vaccine 60+ Years old (1 - PCV) 07/11/2022 (Originally 12/10/2019)   Hepatitis C Screening  12/27/2022 (Originally 12/09/1973)   INFLUENZA VACCINE  01/17/2022   TETANUS/TDAP  04/19/2022   MAMMOGRAM  02/09/2023   COLONOSCOPY (Pts 45-61yr Insurance coverage will need to be confirmed)  11/23/2024   DEXA SCAN  Completed   HPV VACCINES  Aged Out   Immunization History  Administered Date(s) Administered   Influenza Split 03/25/2017, 03/15/2018, 02/12/2019, 04/06/2020   Influenza, High Dose Seasonal PF 03/02/2021   Influenza,inj,Quad PF,6+ Mos 03/15/2018   PFIZER(Purple Top)SARS-COV-2 Vaccination 08/22/2019, 09/15/2019, 05/04/2020   Pfizer Covid-19 Vaccine Bivalent Booster 160yr& up 03/04/2021   Td 06/19/2002   Tdap 04/19/2012   Zoster, Live 01/14/2015    These are the patient goals that we discussed:  Goals Addressed               This Visit's Progress     Patient Stated (pt-stated)        Continue to maintain her healthy lifestyle.         This is a list of Health Maintenance Items that are overdue or due now: Pneumococcal vaccine  Shingrix vaccine Mammogram- due in August.    Orders/Referrals Placed Today: Orders Placed This Encounter  Procedures   Mammogram 3D SCREEN BREAST BILATERAL    Standing Status:   Future    Standing Expiration Date:   12/27/2022    Scheduling Instructions:     Please call the patient schedule. She is due in August.    Order Specific Question:   Reason for Exam (SYMPTOM  OR DIAGNOSIS  REQUIRED)    Answer:   Breast cancer screening    Order Specific Question:   Preferred imaging location?    Answer:   [facility] (Contact our referral department at [phone] you have not spoken with someone about your referral appointment within the next 5 days)    Follow-up Plan Follow-up with MeHali MarryMD as planned Schedule your shingrix vaccine at your pharmacy.  Pneumococcal vaccine can be done at the office after you discuss it with PCP. Medicare wellness visit in one year.  Patient will access AVS on my chart.      Health Maintenance, Female Adopting a healthy lifestyle and getting preventive care are important in promoting health and wellness. Ask your health care provider about: The right schedule for you to have regular tests and exams. Things you can do on your own to prevent diseases and keep yourself healthy. What should I know about diet, weight, and exercise? Eat a healthy diet  Eat a diet that includes plenty of vegetables, fruits, low-fat dairy products, and lean protein. Do not eat a lot of foods that are high in solid fats, added sugars, or sodium. Maintain a healthy weight Body mass index (BMI) is used to identify weight problems. It estimates body fat based on height and weight. Your health care provider can help determine your BMI and help you achieve or maintain a healthy weight. Get regular exercise Get regular  exercise. This is one of the most important things you can do for your health. Most adults should: Exercise for at least 150 minutes each week. The exercise should increase your heart rate and make you sweat (moderate-intensity exercise). Do strengthening exercises at least twice a week. This is in addition to the moderate-intensity exercise. Spend less time sitting. Even light physical activity can be beneficial. Watch cholesterol and blood lipids Have your blood tested for lipids and cholesterol at 67 years of age,  then have this test every 5 years. Have your cholesterol levels checked more often if: Your lipid or cholesterol levels are high. You are older than 67 years of age. You are at high risk for heart disease. What should I know about cancer screening? Depending on your health history and family history, you may need to have cancer screening at various ages. This may include screening for: Breast cancer. Cervical cancer. Colorectal cancer. Skin cancer. Lung cancer. What should I know about heart disease, diabetes, and high blood pressure? Blood pressure and heart disease High blood pressure causes heart disease and increases the risk of stroke. This is more likely to develop in people who have high blood pressure readings or are overweight. Have your blood pressure checked: Every 3-5 years if you are 66-4 years of age. Every year if you are 14 years old or older. Diabetes Have regular diabetes screenings. This checks your fasting blood sugar level. Have the screening done: Once every three years after age 60 if you are at a normal weight and have a low risk for diabetes. More often and at a younger age if you are overweight or have a high risk for diabetes. What should I know about preventing infection? Hepatitis B If you have a higher risk for hepatitis B, you should be screened for this virus. Talk with your health care provider to find out if you are at risk for hepatitis B infection. Hepatitis C Testing is recommended for: Everyone born from [birth year] through 1966. Anyone with known risk factors for hepatitis C. Sexually transmitted infections (STIs) Get screened for STIs, including gonorrhea and chlamydia, if: You are sexually active and are younger than 67 years of age. You are older than 67 years of age and your health care provider tells you that you are at risk for this type of infection. Your sexual activity has changed since you were last screened, and you are at increased risk  for chlamydia or gonorrhea. Ask your health care provider if you are at risk. Ask your health care provider about whether you are at high risk for HIV. Your health care provider may recommend a prescription medicine to help prevent HIV infection. If you choose to take medicine to prevent HIV, you should first get tested for HIV. You should then be tested every 3 months for as long as you are taking the medicine. Pregnancy If you are about to stop having your period (premenopausal) and you may become pregnant, seek counseling before you get pregnant. Take 400 to 800 micrograms (mcg) of folic acid every day if you become pregnant. Ask for birth control (contraception) if you want to prevent pregnancy. Osteoporosis and menopause Osteoporosis is a disease in which the bones lose minerals and strength with aging. This can result in bone fractures. If you are 84 years old or older, or if you are at risk for osteoporosis and fractures, ask your health care provider if you should: Be screened for bone loss. Take a calcium or  vitamin D supplement to lower your risk of fractures. Be given hormone replacement therapy (HRT) to treat symptoms of menopause. Follow these instructions at home: Alcohol use Do not drink alcohol if: Your health care provider tells you not to drink. You are pregnant, may be pregnant, or are planning to become pregnant. If you drink alcohol: Limit how much you have to: 0-1 drink a day. Know how much alcohol is in your drink. In the [place] one drink equals one 12 oz bottle of beer (355 mL), one 5 oz glass of wine (148 mL), or one 1 oz glass of hard liquor (44 mL). Lifestyle Do not use any products that contain nicotine or tobacco. These products include cigarettes, chewing tobacco, and vaping devices, such as e-cigarettes. If you need help quitting, ask your health care provider. Do not use street drugs. Do not share needles. Ask your health care provider for help if you need  support or information about quitting drugs. General instructions Schedule regular health, dental, and eye exams. Stay current with your vaccines. Tell your health care provider if: You often feel depressed. You have ever been abused or do not feel safe at home. Summary Adopting a healthy lifestyle and getting preventive care are important in promoting health and wellness. Follow your health care provider's instructions about healthy diet, exercising, and getting tested or screened for diseases. Follow your health care provider's instructions on monitoring your cholesterol and blood pressure. This information is not intended to replace advice given to you by your health care provider. Make sure you discuss any questions you have with your health care provider. Document Revised: 10/25/2020 Document Reviewed: 10/25/2020 Elsevier Patient Education  [place].

## 2021-12-26 NOTE — Progress Notes (Signed)
MEDICARE ANNUAL WELLNESS VISIT  12/26/2021  Telephone Visit Disclaimer This Medicare AWV was conducted by telephone due to national recommendations for restrictions regarding the COVID-19 Pandemic (e.g. social distancing).  I verified, using two identifiers, that I am speaking with Sheila Harris or their authorized healthcare agent. I discussed the limitations, risks, security, and privacy concerns of performing an evaluation and management service by telephone and the potential availability of an in-person appointment in the future. The patient expressed understanding and agreed to proceed.  Location of Patient: Home Location of Provider (nurse):    Subjective:    Sheila Harris is a 66 y.o. female patient of Metheney, Rene Kocher, MD who had a Medicare Annual Wellness Visit today via telephone. Sheila Harris is Retired and lives with their family. she has 1 child. she reports that she is socially active and does interact with friends/family regularly. she is moderately physically active and enjoys staying active.  Patient Care Team: Hali Marry, MD as PCP - General (Family Medicine) Hecox, Pricilla Larsson (Psychology) Richmond Campbell, MD as Consulting Physician (Gastroenterology)     12/26/2021   10:29 AM 01/04/2016   12:02 PM 04/22/2015    9:22 AM  Advanced Directives  Does Patient Have a Medical Advance Directive? Yes No No  Type of Advance Directive [place]    Does patient want to make changes to medical advance directive? No - Patient declined    Copy of [place] in Chart? No - copy requested    Would patient like information on creating a medical advance directive?  Yes - Educational materials given No - patient declined information    Hospital Utilization Over the Past 12 Months: # of hospitalizations or ER visits: 0 # of surgeries: 0  Review of Systems    Patient reports that her overall health is unchanged compared to last  year.  History obtained from chart review and the patient  Patient Reported Readings (BP, Pulse, CBG, Weight, etc) none  Pain Assessment Pain : No/denies pain     Current Medications & Allergies (verified) Allergies as of 12/26/2021   No Known Allergies      Medication List        Accurate as of December 26, 2021 10:41 AM. If you have any questions, ask your nurse or doctor.          AMBULATORY NON FORMULARY MEDICATION Medication Name: Estradiol '2mg'$ /Gm Cream. Apply 2 clicks twice daily to inner thighs.   ascorbic acid 100 MG tablet Commonly known as: VITAMIN C Take by mouth.   b complex vitamins tablet Take by mouth.   CALCIUM PO Take 1,500 mg by mouth daily.   clonazePAM 0.5 MG tablet Commonly known as: KLONOPIN 1/2 TABLET AT BEDTIME AS NEEDED FOR ANXIETY ORALLY 30 DAYS   levothyroxine 75 MCG tablet Commonly known as: SYNTHROID Take 1 tablet (75 mcg total) by mouth daily before breakfast. , excdept 1/2 tab on Sundays What changed: additional instructions   liothyronine 25 MCG tablet Commonly known as: CYTOMEL TAKE HALF A TABLET BY MOUTH DAILY   MAGNESIUM PO Take 1,000 mg by mouth daily.   progesterone 100 MG capsule Commonly known as: PROMETRIUM TAKE ONE CAPSULE BY MOUTH EVERY NIGHT AT BEDTIME   Thera Tabs Take by mouth.   valACYclovir 500 MG tablet Commonly known as: VALTREX Take 1 tablet (500 mg total) by mouth 2 (two) times daily.   VITAMIN D PO Take 2,500 mg by mouth daily.  History (reviewed): Past Medical History:  Diagnosis Date   Genital herpes    Macular degeneration    Thyroid disease    History reviewed. No pertinent surgical history. Family History  Problem Relation Age of Onset   Heart disease Mother    Hypertension Mother    Stroke Mother    Breast cancer Sister    Healthy Sister    Social History   Socioeconomic History   Marital status: Married    Spouse name: Aahna Rossa Sr.   Number of children: 1    Years of education: 12   Highest education level: 12th grade  Occupational History   Occupation: Retired  Tobacco Use   Smoking status: Never   Smokeless tobacco: Never  Substance and Sexual Activity   Alcohol use: Yes    Alcohol/week: 5.0 standard drinks of alcohol    Types: 5 Standard drinks or equivalent per week    Comment: wine 1 glass 5 days a week   Drug use: Never   Sexual activity: Not Currently    Partners: Male  Other Topics Concern   Not on file  Social History Narrative   Lives with her husband and her son. She enjoys staying active.   Social Determinants of Health   Financial Resource Strain: Low Risk  (12/26/2021)   Overall Financial Resource Strain (CARDIA)    Difficulty of Paying Living Expenses: Not hard at all  Food Insecurity: No Food Insecurity (07/11/2021)   Hunger Vital Sign    Worried About Running Out of Food in the Last Year: Never true    Ran Out of Food in the Last Year: Never true  Transportation Needs: No Transportation Needs (12/26/2021)   PRAPARE - Hydrologist (Medical): No    Lack of Transportation (Non-Medical): No  Physical Activity: Sufficiently Active (12/26/2021)   Exercise Vital Sign    Days of Exercise per Week: 7 days    Minutes of Exercise per Session: 30 min  Stress: No Stress Concern Present (12/26/2021)   [place]    Feeling of Stress : Not at all  Social Connections: Moderately Isolated (12/26/2021)   Social Connection and Isolation Panel [NHANES]    Frequency of Communication with Friends and Family: More than three times a week    Frequency of Social Gatherings with Friends and Family: Three times a week    Attends Religious Services: Never    Active Member of Clubs or Organizations: No    Attends Archivist Meetings: Never    Marital Status: Married    Activities of Daily Living    12/26/2021   10:31 AM  In your  present state of health, do you have any difficulty performing the following activities:  Hearing? 0  Vision? 0  Difficulty concentrating or making decisions? 0  Walking or climbing stairs? 0  Dressing or bathing? 0  Doing errands, shopping? 0  Preparing Food and eating ? N  Using the Toilet? N  In the past six months, have you accidently leaked urine? N  Do you have problems with loss of bowel control? N  Managing your Medications? N  Managing your Finances? N  Housekeeping or managing your Housekeeping? N    Patient Education/ Literacy How often do you need to have someone help you when you read instructions, pamphlets, or other written materials from your doctor or pharmacy?: 1 - Never What is the last grade level  you completed in school?: 12th grade  Exercise Current Exercise Habits: Home exercise routine, Type of exercise: walking, Time (Minutes): 30, Frequency (Times/Week): 7, Weekly Exercise (Minutes/Week): 210, Intensity: Moderate, Exercise limited by: None identified  Diet Patient reports consuming  3-4  meals a day and 2 snack(s) a day Patient reports that her primary diet is: Regular Patient reports that she does have regular access to food.   Depression Screen    12/26/2021   10:29 AM 11/07/2021    2:10 PM 07/11/2021    8:50 AM 05/20/2020   12:19 PM 11/19/2019   10:30 AM  PHQ 2/9 Scores  PHQ - 2 Score 0 0 0 1 2  PHQ- 9 Score    9 6     Fall Risk    12/26/2021   10:29 AM 12/08/2021   11:13 AM 11/07/2021    2:10 PM 07/11/2021    8:50 AM 11/19/2019   10:30 AM  Fall Risk   Falls in the past year? 0 0 0 0 0  Number falls in past yr: 0 0 0 0   Injury with Fall? 0 0 0 0   Risk for fall due to : No Fall Risks No Fall Risks No Fall Risks No Fall Risks No Fall Risks  Follow up Falls evaluation completed Falls prevention discussed;Falls evaluation completed Falls evaluation completed Falls prevention discussed;Falls evaluation completed      Objective:  Sheila Harris  seemed alert and oriented and she participated appropriately during our telephone visit.  Blood Pressure Weight BMI  BP Readings from Last 3 Encounters:  12/08/21 124/73  11/07/21 127/79  08/11/21 129/67   Wt Readings from Last 3 Encounters:  12/08/21 135 lb (61.2 kg)  11/07/21 134 lb 11.2 oz (61.1 kg)  08/11/21 133 lb (60.3 kg)   BMI Readings from Last 1 Encounters:  12/08/21 21.79 kg/m    *Unable to obtain current vital signs, weight, and BMI due to telephone visit type  Hearing/Vision  Sheila Harris did not seem to have difficulty with hearing/understanding during the telephone conversation Reports that she has had a formal eye exam by an eye care professional within the past year Reports that she has not had a formal hearing evaluation within the past year *Unable to fully assess hearing and vision during telephone visit type  Cognitive Function:    12/26/2021   10:35 AM  6CIT Screen  What Year? 0 points  What month? 0 points  What time? 0 points  Count back from 20 0 points  Months in reverse 0 points  Repeat phrase 0 points  Total Score 0 points   (Normal:0-7, Significant for Dysfunction: >8)  Normal Cognitive Function Screening: Yes   Immunization & Health Maintenance Record Immunization History  Administered Date(s) Administered   Influenza Split 03/25/2017, 03/15/2018, 02/12/2019, 04/06/2020   Influenza, High Dose Seasonal PF 03/02/2021   Influenza,inj,Quad PF,6+ Mos 03/15/2018   PFIZER(Purple Top)SARS-COV-2 Vaccination 08/22/2019, 09/15/2019, 05/04/2020   Pfizer Covid-19 Vaccine Bivalent Booster 11yr & up 03/04/2021   Td 06/19/2002   Tdap 04/19/2012   Zoster, Live 01/14/2015    Health Maintenance  Topic Date Due   COVID-19 Vaccine (5 - Pfizer series) 01/11/2022 (Originally 07/04/2021)   Zoster Vaccines- Shingrix (1 of 2) 03/10/2022 (Originally 12/09/2004)   Pneumonia Vaccine 67 Years old (1 - PCV) 07/11/2022 (Originally 12/10/2019)   Hepatitis C Screening   12/27/2022 (Originally 12/09/1973)   INFLUENZA VACCINE  01/17/2022   TETANUS/TDAP  04/19/2022   MAMMOGRAM  02/09/2023  COLONOSCOPY (Pts 45-59yr Insurance coverage will need to be confirmed)  11/23/2024   DEXA SCAN  Completed   HPV VACCINES  Aged Out       Assessment  This is a routine wellness examination for Sheila Harris.  Health Maintenance: Due or Overdue There are no preventive care reminders to display for this patient.   [facility] does not need a referral for Community Assistance: Care Management:   no Social Work:    no Prescription Assistance:  no Nutrition/Diabetes Education:  no   Plan:  Personalized Goals  Goals Addressed               This Visit's Progress     Patient Stated (pt-stated)        Continue to maintain her healthy lifestyle.       Personalized Health Maintenance & Screening Recommendations  Pneumococcal vaccine  Shingrix vaccine Mammogram- due in August.  Lung Cancer Screening Recommended: no (Low Dose CT Chest recommended if Age 67-80years, 30 pack-year currently smoking OR have quit w/in past 15 years) Hepatitis C Screening recommended: yes HIV Screening recommended: no  Advanced Directives: Written information was not prepared per patient's request.  Referrals & Orders Orders Placed This Encounter  Procedures   Mammogram 3D SCREEN BREAST BILATERAL    Follow-up Plan Follow-up with MHali Marry MD as planned Schedule your shingrix vaccine at your pharmacy.  Pneumococcal vaccine can be done at the office after you discuss it with PCP. Medicare wellness visit in one year.  Patient will access AVS on my chart.   I have personally reviewed and noted the following in the patient's chart:   Medical and social history Use of alcohol, tobacco or illicit drugs  Current medications and supplements Functional ability and status Nutritional status Physical activity Advanced directives List of other  physicians Hospitalizations, surgeries, and ER visits in previous 12 months Vitals Screenings to include cognitive, depression, and falls Referrals and appointments  In addition, I have reviewed and discussed with Sheila Harris certain preventive protocols, quality metrics, and best practice recommendations. A written personalized care plan for preventive services as well as general preventive health recommendations is available and can be mailed to the patient at her request.      BTinnie Gens RN BSN  12/26/2021

## 2021-12-27 DIAGNOSIS — M19071 Primary osteoarthritis, right ankle and foot: Secondary | ICD-10-CM | POA: Diagnosis not present

## 2021-12-29 DIAGNOSIS — M810 Age-related osteoporosis without current pathological fracture: Secondary | ICD-10-CM | POA: Diagnosis not present

## 2021-12-29 DIAGNOSIS — M545 Low back pain, unspecified: Secondary | ICD-10-CM | POA: Diagnosis not present

## 2021-12-29 LAB — GLUCOSE, POCT (MANUAL RESULT ENTRY): POC Glucose: 107 mg/dl — AB (ref 70–99)

## 2022-01-03 DIAGNOSIS — M5416 Radiculopathy, lumbar region: Secondary | ICD-10-CM | POA: Diagnosis not present

## 2022-01-04 ENCOUNTER — Other Ambulatory Visit: Payer: Self-pay | Admitting: Family Medicine

## 2022-01-06 DIAGNOSIS — M545 Low back pain, unspecified: Secondary | ICD-10-CM | POA: Diagnosis not present

## 2022-01-08 ENCOUNTER — Encounter: Payer: Self-pay | Admitting: Family Medicine

## 2022-01-09 DIAGNOSIS — H25813 Combined forms of age-related cataract, bilateral: Secondary | ICD-10-CM | POA: Diagnosis not present

## 2022-01-09 DIAGNOSIS — M9903 Segmental and somatic dysfunction of lumbar region: Secondary | ICD-10-CM | POA: Diagnosis not present

## 2022-01-09 DIAGNOSIS — H353132 Nonexudative age-related macular degeneration, bilateral, intermediate dry stage: Secondary | ICD-10-CM | POA: Diagnosis not present

## 2022-01-09 DIAGNOSIS — M9905 Segmental and somatic dysfunction of pelvic region: Secondary | ICD-10-CM | POA: Diagnosis not present

## 2022-01-09 DIAGNOSIS — M9901 Segmental and somatic dysfunction of cervical region: Secondary | ICD-10-CM | POA: Diagnosis not present

## 2022-01-09 DIAGNOSIS — H35453 Secondary pigmentary degeneration, bilateral: Secondary | ICD-10-CM | POA: Diagnosis not present

## 2022-01-09 DIAGNOSIS — H35363 Drusen (degenerative) of macula, bilateral: Secondary | ICD-10-CM | POA: Diagnosis not present

## 2022-01-09 DIAGNOSIS — M9902 Segmental and somatic dysfunction of thoracic region: Secondary | ICD-10-CM | POA: Diagnosis not present

## 2022-01-13 ENCOUNTER — Other Ambulatory Visit: Payer: Self-pay | Admitting: Family Medicine

## 2022-01-16 DIAGNOSIS — M5136 Other intervertebral disc degeneration, lumbar region: Secondary | ICD-10-CM | POA: Diagnosis not present

## 2022-01-16 DIAGNOSIS — M5451 Vertebrogenic low back pain: Secondary | ICD-10-CM | POA: Diagnosis not present

## 2022-01-17 DIAGNOSIS — M5417 Radiculopathy, lumbosacral region: Secondary | ICD-10-CM | POA: Diagnosis not present

## 2022-01-17 DIAGNOSIS — M7741 Metatarsalgia, right foot: Secondary | ICD-10-CM | POA: Diagnosis not present

## 2022-01-17 DIAGNOSIS — M19071 Primary osteoarthritis, right ankle and foot: Secondary | ICD-10-CM | POA: Diagnosis not present

## 2022-01-17 DIAGNOSIS — M25571 Pain in right ankle and joints of right foot: Secondary | ICD-10-CM | POA: Diagnosis not present

## 2022-01-30 ENCOUNTER — Other Ambulatory Visit: Payer: Self-pay | Admitting: Family Medicine

## 2022-02-01 DIAGNOSIS — M9901 Segmental and somatic dysfunction of cervical region: Secondary | ICD-10-CM | POA: Diagnosis not present

## 2022-02-01 DIAGNOSIS — M9902 Segmental and somatic dysfunction of thoracic region: Secondary | ICD-10-CM | POA: Diagnosis not present

## 2022-02-01 DIAGNOSIS — M9903 Segmental and somatic dysfunction of lumbar region: Secondary | ICD-10-CM | POA: Diagnosis not present

## 2022-02-01 DIAGNOSIS — M9905 Segmental and somatic dysfunction of pelvic region: Secondary | ICD-10-CM | POA: Diagnosis not present

## 2022-02-09 ENCOUNTER — Ambulatory Visit (INDEPENDENT_AMBULATORY_CARE_PROVIDER_SITE_OTHER): Payer: PPO

## 2022-02-09 DIAGNOSIS — Z0000 Encounter for general adult medical examination without abnormal findings: Secondary | ICD-10-CM

## 2022-02-09 DIAGNOSIS — Z1231 Encounter for screening mammogram for malignant neoplasm of breast: Secondary | ICD-10-CM | POA: Diagnosis not present

## 2022-02-10 NOTE — Progress Notes (Signed)
Hi Bianney,  Some asymmetry of the right breast. Imaging dep will be calling for additional  view.

## 2022-02-13 ENCOUNTER — Other Ambulatory Visit: Payer: Self-pay | Admitting: Family Medicine

## 2022-02-13 DIAGNOSIS — D2239 Melanocytic nevi of other parts of face: Secondary | ICD-10-CM | POA: Diagnosis not present

## 2022-02-13 DIAGNOSIS — L821 Other seborrheic keratosis: Secondary | ICD-10-CM | POA: Diagnosis not present

## 2022-02-13 DIAGNOSIS — M5459 Other low back pain: Secondary | ICD-10-CM | POA: Diagnosis not present

## 2022-02-13 DIAGNOSIS — L814 Other melanin hyperpigmentation: Secondary | ICD-10-CM | POA: Diagnosis not present

## 2022-02-13 DIAGNOSIS — R928 Other abnormal and inconclusive findings on diagnostic imaging of breast: Secondary | ICD-10-CM

## 2022-02-13 DIAGNOSIS — D225 Melanocytic nevi of trunk: Secondary | ICD-10-CM | POA: Diagnosis not present

## 2022-02-15 DIAGNOSIS — M5459 Other low back pain: Secondary | ICD-10-CM | POA: Diagnosis not present

## 2022-02-21 ENCOUNTER — Other Ambulatory Visit: Payer: Self-pay | Admitting: Family Medicine

## 2022-02-21 DIAGNOSIS — M5459 Other low back pain: Secondary | ICD-10-CM | POA: Diagnosis not present

## 2022-02-21 DIAGNOSIS — E039 Hypothyroidism, unspecified: Secondary | ICD-10-CM

## 2022-02-22 ENCOUNTER — Ambulatory Visit (INDEPENDENT_AMBULATORY_CARE_PROVIDER_SITE_OTHER): Payer: PPO | Admitting: Family Medicine

## 2022-02-22 ENCOUNTER — Encounter: Payer: Self-pay | Admitting: Family Medicine

## 2022-02-22 VITALS — BP 113/68 | HR 84 | Temp 97.7°F | Ht 66.0 in | Wt 132.0 lb

## 2022-02-22 DIAGNOSIS — E618 Deficiency of other specified nutrient elements: Secondary | ICD-10-CM | POA: Diagnosis not present

## 2022-02-22 DIAGNOSIS — E611 Iron deficiency: Secondary | ICD-10-CM | POA: Diagnosis not present

## 2022-02-22 DIAGNOSIS — B009 Herpesviral infection, unspecified: Secondary | ICD-10-CM | POA: Diagnosis not present

## 2022-02-22 NOTE — Assessment & Plan Note (Signed)
Currently on daily suppressive therapy 500 mg we discussed that we could go up to 1000 mg daily for short period of time maybe 4 to 6 months and then taper back down to try to get a little bit more suppression.  She says she does not feel good when she takes 1000 mg so only uses a higher dose if she is actually having an outbreak and would prefer to stay at 500 mg which I think is reasonable.

## 2022-02-22 NOTE — Progress Notes (Signed)
Established Patient Office Visit  Subjective   Patient ID: Sheila Harris, female    DOB: January 31, 1956  Age: 66 y.o. MRN: [id number]  Chief Complaint  Patient presents with   Follow-up    HPI  She has been to physical therapy for her right foot and right hip.  She has been working with chiropractor as well.  She actually recently saw the orthopedist and they did an MRI on her spine and they did not feel like there was any specific cause they thought maybe it was the performance muscle but the physical therapist disagrees and does not feel like the performance muscles having any issues.  So she is wondering if could actually be her herpes simplex because she does notice that when she has a breakout her pain down her leg is actually worse.  And things like friction and increased activity level will sometimes trigger a breakout.  She is on valacyclovir 5 mg daily and still has a breakout about every 4 to 6 months.  She is also wondering if she could have low B12.    ROS    Objective:     BP 113/68 (BP Location: Left Arm, Patient Position: Sitting, Cuff Size: Normal)   Pulse 84   Temp 97.7 F (36.5 C) (Oral)   Ht '5\' 6"'$  (1.676 m)   Wt 132 lb 0.6 oz (59.9 kg)   LMP 04/03/2010   SpO2 97%   BMI 21.31 kg/m    Physical Exam Vitals and nursing note reviewed.  Constitutional:      Appearance: She is well-developed.  HENT:     Head: Normocephalic and atraumatic.  Cardiovascular:     Rate and Rhythm: Normal rate and regular rhythm.     Heart sounds: Normal heart sounds.  Pulmonary:     Effort: Pulmonary effort is normal.     Breath sounds: Normal breath sounds.  Skin:    General: Skin is warm and dry.  Neurological:     Mental Status: She is alert and oriented to person, place, and time.  Psychiatric:        Behavior: Behavior normal.      No results found for any visits on 02/22/22.    The 10-year ASCVD risk score (Arnett DK, et al., 2019) is: 5%    Assessment & Plan:    Problem List Items Addressed This Visit       Other   Herpes infection    Currently on daily suppressive therapy 500 mg we discussed that we could go up to 1000 mg daily for short period of time maybe 4 to 6 months and then taper back down to try to get a little bit more suppression.  She says she does not feel good when she takes 1000 mg so only uses a higher dose if she is actually having an outbreak and would prefer to stay at 500 mg which I think is reasonable.      Other Visit Diagnoses     Iron deficiency    -  Primary   Relevant Orders   B12   Fe+TIBC+Fer   Vitamin B1   Mineral deficiency       Relevant Orders   B12   Fe+TIBC+Fer   Vitamin B1       Right thigh and lower leg pain-she has been working with the orthopedist and chiropractor she was wondering if it could most be a neuropathy from the herpes simplex I discussed that I am  not aware of simplex causing a chronic neuropathy certainly herpes zoster can.  But when she had that years ago it broke out around her waist.  We did discuss that most of the medications that are used for neuropathy would include things like gabapentin, Lyrica and Cymbalta and she is not interested in going on those medications.  Iron deficiency-she is due to recheck her iron levels in addition we will check B12 and B1, to make sure that it is not contributing to any type of neuropathy.  She does eat a primarily vegetarian type diet.  No follow-ups on file.    [name], MD

## 2022-02-23 ENCOUNTER — Encounter: Payer: Self-pay | Admitting: Family Medicine

## 2022-02-23 DIAGNOSIS — M5459 Other low back pain: Secondary | ICD-10-CM | POA: Diagnosis not present

## 2022-02-23 DIAGNOSIS — E611 Iron deficiency: Secondary | ICD-10-CM | POA: Insufficient documentation

## 2022-02-23 NOTE — Progress Notes (Signed)
Sheila Harris, you are still superlow on iron.  Are you taking a supplement right now?  If not we definitely need to get you back on that and we need to recheck your level in 8 weeks.  B12 is normal.  B1 is still pending.

## 2022-02-24 ENCOUNTER — Encounter: Payer: Self-pay | Admitting: Family Medicine

## 2022-02-25 LAB — IRON,TIBC AND FERRITIN PANEL
%SAT: 4 % (calc) — ABNORMAL LOW (ref 16–45)
Ferritin: 4 ng/mL — ABNORMAL LOW (ref 16–288)
Iron: 20 ug/dL — ABNORMAL LOW (ref 45–160)
TIBC: 459 mcg/dL (calc) — ABNORMAL HIGH (ref 250–450)

## 2022-02-25 LAB — VITAMIN B1: Vitamin B1 (Thiamine): 56 nmol/L — ABNORMAL HIGH (ref 8–30)

## 2022-02-25 LAB — VITAMIN B12: Vitamin B-12: 492 pg/mL (ref 200–1100)

## 2022-02-27 ENCOUNTER — Encounter: Payer: Self-pay | Admitting: Family Medicine

## 2022-02-27 ENCOUNTER — Ambulatory Visit
Admission: RE | Admit: 2022-02-27 | Discharge: 2022-02-27 | Disposition: A | Discharge status: Home / Self Care | Payer: PPO | Source: Ambulatory Visit | Attending: Family Medicine | Admitting: Family Medicine

## 2022-02-27 DIAGNOSIS — R928 Other abnormal and inconclusive findings on diagnostic imaging of breast: Secondary | ICD-10-CM

## 2022-02-27 DIAGNOSIS — M5459 Other low back pain: Secondary | ICD-10-CM | POA: Diagnosis not present

## 2022-02-27 DIAGNOSIS — N6489 Other specified disorders of breast: Secondary | ICD-10-CM | POA: Diagnosis not present

## 2022-02-27 NOTE — Progress Notes (Signed)
Vitamin B1 is high so if you are taking extra you can decrease that.  Just make sure to start the iron.

## 2022-03-01 ENCOUNTER — Encounter: Payer: Self-pay | Admitting: Family Medicine

## 2022-03-01 DIAGNOSIS — M5459 Other low back pain: Secondary | ICD-10-CM | POA: Diagnosis not present

## 2022-03-01 NOTE — Telephone Encounter (Signed)
Ok to add [facility]

## 2022-03-02 NOTE — Telephone Encounter (Signed)
She does not need to repeat the TSH until December.

## 2022-03-06 ENCOUNTER — Ambulatory Visit (INDEPENDENT_AMBULATORY_CARE_PROVIDER_SITE_OTHER): Payer: PPO | Admitting: Family Medicine

## 2022-03-06 ENCOUNTER — Encounter: Payer: Self-pay | Admitting: Family Medicine

## 2022-03-06 VITALS — BP 118/72 | Ht 66.0 in | Wt 132.0 lb

## 2022-03-06 DIAGNOSIS — M7741 Metatarsalgia, right foot: Secondary | ICD-10-CM

## 2022-03-06 DIAGNOSIS — B009 Herpesviral infection, unspecified: Secondary | ICD-10-CM

## 2022-03-06 MED ORDER — ACYCLOVIR 5 % EX OINT: 1.0000 | TOPICAL_OINTMENT | CUTANEOUS | 1 refills | Status: AC

## 2022-03-06 NOTE — Progress Notes (Signed)
  Sheila Harris - 66 y.o. female MRN [id number]  Date of birth: [birth year]  SUBJECTIVE:  Including CC & ROS.  No chief complaint on file.   Sheila Harris is a 66 y.o. female that is presenting with acute on chronic right foot pain.  She has had foot pain for a couple years.  She was diagnosed with a stress fracture in January.  She has been in a cam walker for 2 months.  Continues to have pain on the lateral plantar forefoot..   Review of Systems See HPI   HISTORY: Past Medical, Surgical, Social, and Family History Reviewed & Updated per EMR.   Pertinent Historical Findings include:  Past Medical History:  Diagnosis Date   Genital herpes    Macular degeneration    Thyroid disease     History reviewed. No pertinent surgical history.   PHYSICAL EXAM:  VS: BP 118/72 (BP Location: Left Arm, Patient Position: Sitting)   Ht '5\' 6"'$  (1.676 m)   Wt 132 lb (59.9 kg)   LMP 04/03/2010   BMI 21.31 kg/m  Physical Exam Gen: NAD, alert, cooperative with exam, well-appearing MSK:  Neurovascularly intact       ASSESSMENT & PLAN:   Metatarsalgia of right foot Acute on chronic in nature.  Has tried several modalities with ongoing pain with walking and driving. -Counseled on home exercise therapy and supportive care. -Green sport insoles with lateral wedges and lateral posting's. -Could consider custom orthotics

## 2022-03-06 NOTE — Assessment & Plan Note (Signed)
Acute on chronic in nature.  Has tried several modalities with ongoing pain with walking and driving. -Counseled on home exercise therapy and supportive care. -Green sport insoles with lateral wedges and lateral posting's. -Could consider custom orthotics

## 2022-03-14 ENCOUNTER — Encounter: Payer: Self-pay | Admitting: Family Medicine

## 2022-04-05 DIAGNOSIS — M9903 Segmental and somatic dysfunction of lumbar region: Secondary | ICD-10-CM | POA: Diagnosis not present

## 2022-04-05 DIAGNOSIS — M9901 Segmental and somatic dysfunction of cervical region: Secondary | ICD-10-CM | POA: Diagnosis not present

## 2022-04-05 DIAGNOSIS — M9905 Segmental and somatic dysfunction of pelvic region: Secondary | ICD-10-CM | POA: Diagnosis not present

## 2022-04-05 DIAGNOSIS — M9902 Segmental and somatic dysfunction of thoracic region: Secondary | ICD-10-CM | POA: Diagnosis not present

## 2022-04-09 ENCOUNTER — Other Ambulatory Visit: Payer: Self-pay | Admitting: Family Medicine

## 2022-04-14 ENCOUNTER — Other Ambulatory Visit: Payer: Self-pay | Admitting: *Deleted

## 2022-04-15 ENCOUNTER — Encounter: Payer: Self-pay | Admitting: Family Medicine

## 2022-04-17 ENCOUNTER — Other Ambulatory Visit: Payer: Self-pay | Admitting: *Deleted

## 2022-04-17 DIAGNOSIS — Z79890 Hormone replacement therapy (postmenopausal): Secondary | ICD-10-CM

## 2022-04-17 MED ORDER — AMBULATORY NON FORMULARY MEDICATION: 3 refills | Status: DC

## 2022-04-19 DIAGNOSIS — M9905 Segmental and somatic dysfunction of pelvic region: Secondary | ICD-10-CM | POA: Diagnosis not present

## 2022-04-19 DIAGNOSIS — M9901 Segmental and somatic dysfunction of cervical region: Secondary | ICD-10-CM | POA: Diagnosis not present

## 2022-04-19 DIAGNOSIS — M9902 Segmental and somatic dysfunction of thoracic region: Secondary | ICD-10-CM | POA: Diagnosis not present

## 2022-04-19 DIAGNOSIS — M9903 Segmental and somatic dysfunction of lumbar region: Secondary | ICD-10-CM | POA: Diagnosis not present

## 2022-04-20 ENCOUNTER — Encounter: Payer: Self-pay | Admitting: Family Medicine

## 2022-04-20 DIAGNOSIS — E611 Iron deficiency: Secondary | ICD-10-CM

## 2022-04-25 DIAGNOSIS — B029 Zoster without complications: Secondary | ICD-10-CM | POA: Diagnosis not present

## 2022-04-25 DIAGNOSIS — L300 Nummular dermatitis: Secondary | ICD-10-CM | POA: Diagnosis not present

## 2022-04-26 DIAGNOSIS — E611 Iron deficiency: Secondary | ICD-10-CM | POA: Diagnosis not present

## 2022-04-27 ENCOUNTER — Encounter: Payer: Self-pay | Admitting: Family Medicine

## 2022-04-27 LAB — IRON,TIBC AND FERRITIN PANEL
%SAT: 18 % (calc) (ref 16–45)
Ferritin: 6 ng/mL — ABNORMAL LOW (ref 16–288)
Iron: 73 ug/dL (ref 45–160)
TIBC: 414 mcg/dL (calc) (ref 250–450)

## 2022-04-27 NOTE — Progress Notes (Signed)
HI Sheila Harris, your total iron does look better its up to 73 which is great.  Your iron binding capacity is good.  The saturation is also a little bit better.  The ferritin only moved slightly so continue with extra iron and we will plan to recheck again in 3 months.  It often times takes months to sometimes even a year to get the iron all the way back up.  If you are tolerating the iron well you can always take it twice a day as long as its not causing significant stomach upset or constipation.  Also work on eating iron rich foods.

## 2022-04-27 NOTE — Telephone Encounter (Signed)
Not related at all

## 2022-04-27 NOTE — Telephone Encounter (Signed)
We can order a copper level if you would like.  Not sure how well insurance would cover but I am happy to order it if she is concerned that the levels are low.

## 2022-05-04 DIAGNOSIS — M19071 Primary osteoarthritis, right ankle and foot: Secondary | ICD-10-CM | POA: Diagnosis not present

## 2022-05-04 DIAGNOSIS — M25571 Pain in right ankle and joints of right foot: Secondary | ICD-10-CM | POA: Diagnosis not present

## 2022-05-04 DIAGNOSIS — M24274 Disorder of ligament, right foot: Secondary | ICD-10-CM | POA: Diagnosis not present

## 2022-05-04 DIAGNOSIS — R262 Difficulty in walking, not elsewhere classified: Secondary | ICD-10-CM | POA: Diagnosis not present

## 2022-05-09 DIAGNOSIS — R262 Difficulty in walking, not elsewhere classified: Secondary | ICD-10-CM | POA: Diagnosis not present

## 2022-05-09 DIAGNOSIS — M24274 Disorder of ligament, right foot: Secondary | ICD-10-CM | POA: Diagnosis not present

## 2022-05-09 DIAGNOSIS — M25571 Pain in right ankle and joints of right foot: Secondary | ICD-10-CM | POA: Diagnosis not present

## 2022-05-09 DIAGNOSIS — M19071 Primary osteoarthritis, right ankle and foot: Secondary | ICD-10-CM | POA: Diagnosis not present

## 2022-05-10 ENCOUNTER — Encounter: Payer: Self-pay | Admitting: Sports Medicine

## 2022-05-10 ENCOUNTER — Ambulatory Visit (INDEPENDENT_AMBULATORY_CARE_PROVIDER_SITE_OTHER): Payer: PPO

## 2022-05-10 DIAGNOSIS — M25512 Pain in left shoulder: Secondary | ICD-10-CM | POA: Diagnosis not present

## 2022-05-10 DIAGNOSIS — G8929 Other chronic pain: Secondary | ICD-10-CM

## 2022-05-10 DIAGNOSIS — M5033 Other cervical disc degeneration, cervicothoracic region: Secondary | ICD-10-CM | POA: Diagnosis not present

## 2022-05-10 DIAGNOSIS — M47812 Spondylosis without myelopathy or radiculopathy, cervical region: Secondary | ICD-10-CM | POA: Diagnosis not present

## 2022-05-16 ENCOUNTER — Ambulatory Visit: Payer: PPO | Admitting: Sports Medicine

## 2022-05-16 DIAGNOSIS — M19071 Primary osteoarthritis, right ankle and foot: Secondary | ICD-10-CM | POA: Diagnosis not present

## 2022-05-16 DIAGNOSIS — M25571 Pain in right ankle and joints of right foot: Secondary | ICD-10-CM | POA: Diagnosis not present

## 2022-05-16 DIAGNOSIS — R262 Difficulty in walking, not elsewhere classified: Secondary | ICD-10-CM | POA: Diagnosis not present

## 2022-05-16 DIAGNOSIS — M24274 Disorder of ligament, right foot: Secondary | ICD-10-CM | POA: Diagnosis not present

## 2022-05-18 DIAGNOSIS — M24274 Disorder of ligament, right foot: Secondary | ICD-10-CM | POA: Diagnosis not present

## 2022-05-18 DIAGNOSIS — R262 Difficulty in walking, not elsewhere classified: Secondary | ICD-10-CM | POA: Diagnosis not present

## 2022-05-18 DIAGNOSIS — M25571 Pain in right ankle and joints of right foot: Secondary | ICD-10-CM | POA: Diagnosis not present

## 2022-05-18 DIAGNOSIS — M19071 Primary osteoarthritis, right ankle and foot: Secondary | ICD-10-CM | POA: Diagnosis not present

## 2022-05-23 DIAGNOSIS — M19071 Primary osteoarthritis, right ankle and foot: Secondary | ICD-10-CM | POA: Diagnosis not present

## 2022-05-23 DIAGNOSIS — M24274 Disorder of ligament, right foot: Secondary | ICD-10-CM | POA: Diagnosis not present

## 2022-05-23 DIAGNOSIS — M25571 Pain in right ankle and joints of right foot: Secondary | ICD-10-CM | POA: Diagnosis not present

## 2022-05-23 DIAGNOSIS — R262 Difficulty in walking, not elsewhere classified: Secondary | ICD-10-CM | POA: Diagnosis not present

## 2022-05-24 DIAGNOSIS — M48061 Spinal stenosis, lumbar region without neurogenic claudication: Secondary | ICD-10-CM | POA: Diagnosis not present

## 2022-05-24 DIAGNOSIS — M5136 Other intervertebral disc degeneration, lumbar region: Secondary | ICD-10-CM | POA: Diagnosis not present

## 2022-05-24 DIAGNOSIS — G894 Chronic pain syndrome: Secondary | ICD-10-CM | POA: Diagnosis not present

## 2022-05-24 DIAGNOSIS — M4726 Other spondylosis with radiculopathy, lumbar region: Secondary | ICD-10-CM | POA: Diagnosis not present

## 2022-05-24 DIAGNOSIS — M79604 Pain in right leg: Secondary | ICD-10-CM | POA: Diagnosis not present

## 2022-05-25 ENCOUNTER — Ambulatory Visit: Payer: PPO | Admitting: Sports Medicine

## 2022-05-30 DIAGNOSIS — R262 Difficulty in walking, not elsewhere classified: Secondary | ICD-10-CM | POA: Diagnosis not present

## 2022-05-30 DIAGNOSIS — M24274 Disorder of ligament, right foot: Secondary | ICD-10-CM | POA: Diagnosis not present

## 2022-05-30 DIAGNOSIS — M19071 Primary osteoarthritis, right ankle and foot: Secondary | ICD-10-CM | POA: Diagnosis not present

## 2022-05-30 DIAGNOSIS — M25571 Pain in right ankle and joints of right foot: Secondary | ICD-10-CM | POA: Diagnosis not present

## 2022-06-05 ENCOUNTER — Encounter: Payer: Self-pay | Admitting: Sports Medicine

## 2022-06-05 ENCOUNTER — Ambulatory Visit (INDEPENDENT_AMBULATORY_CARE_PROVIDER_SITE_OTHER): Payer: PPO | Admitting: Sports Medicine

## 2022-06-05 VITALS — BP 131/86 | HR 89

## 2022-06-05 DIAGNOSIS — M5412 Radiculopathy, cervical region: Secondary | ICD-10-CM | POA: Diagnosis not present

## 2022-06-05 NOTE — Patient Instructions (Signed)

## 2022-06-05 NOTE — Assessment & Plan Note (Signed)
This is a very pleasant 66 year old female, she has known multilevel cervical DDD, axial neck pain with occasional periscapular symptoms, she has not done medications or epidurals, we did discuss aggressive lifestyle changes including home physical therapy, Mediterranean diet. She will try these first and return to see me on an as-needed basis.

## 2022-06-05 NOTE — Progress Notes (Signed)
    Procedures performed today:    None.  Independent interpretation of notes and tests performed by another provider:   None.  Brief History, Exam, Impression, and Recommendations:    Right cervical radiculopathy This is a very pleasant 66 year old female, she has known multilevel cervical DDD, axial neck pain with occasional periscapular symptoms, she has not done medications or epidurals, we did discuss aggressive lifestyle changes including home physical therapy, Mediterranean diet. She will try these first and return to see me on an as-needed basis.    ____________________________________________ Gwen Her. Dianah Field, M.D., ABFM., CAQSM., AME. Primary Care and Sports Medicine [facility]  Adjunct Professor of [employer]  Risk manager

## 2022-06-06 ENCOUNTER — Other Ambulatory Visit: Payer: Self-pay | Admitting: Family Medicine

## 2022-06-06 DIAGNOSIS — F419 Anxiety disorder, unspecified: Secondary | ICD-10-CM

## 2022-06-08 ENCOUNTER — Other Ambulatory Visit: Payer: Self-pay | Admitting: Family Medicine

## 2022-07-11 ENCOUNTER — Other Ambulatory Visit: Payer: Self-pay

## 2022-07-11 MED ORDER — LIOTHYRONINE SODIUM 25 MCG PO TABS: ORAL_TABLET | ORAL | 1 refills | Status: DC

## 2022-07-12 ENCOUNTER — Other Ambulatory Visit: Payer: Self-pay | Admitting: Family Medicine

## 2022-07-13 ENCOUNTER — Ambulatory Visit (INDEPENDENT_AMBULATORY_CARE_PROVIDER_SITE_OTHER): Payer: PPO | Admitting: Family Medicine

## 2022-07-13 ENCOUNTER — Encounter: Payer: Self-pay | Admitting: Family Medicine

## 2022-07-13 VITALS — BP 127/66 | HR 75 | Ht 66.0 in | Wt 136.0 lb

## 2022-07-13 DIAGNOSIS — M818 Other osteoporosis without current pathological fracture: Secondary | ICD-10-CM

## 2022-07-13 DIAGNOSIS — M9905 Segmental and somatic dysfunction of pelvic region: Secondary | ICD-10-CM | POA: Diagnosis not present

## 2022-07-13 DIAGNOSIS — M9903 Segmental and somatic dysfunction of lumbar region: Secondary | ICD-10-CM | POA: Diagnosis not present

## 2022-07-13 DIAGNOSIS — E569 Vitamin deficiency, unspecified: Secondary | ICD-10-CM

## 2022-07-13 DIAGNOSIS — Z13220 Encounter for screening for lipoid disorders: Secondary | ICD-10-CM | POA: Diagnosis not present

## 2022-07-13 DIAGNOSIS — Z0000 Encounter for general adult medical examination without abnormal findings: Secondary | ICD-10-CM

## 2022-07-13 DIAGNOSIS — E039 Hypothyroidism, unspecified: Secondary | ICD-10-CM

## 2022-07-13 DIAGNOSIS — M9902 Segmental and somatic dysfunction of thoracic region: Secondary | ICD-10-CM | POA: Diagnosis not present

## 2022-07-13 DIAGNOSIS — E611 Iron deficiency: Secondary | ICD-10-CM

## 2022-07-13 DIAGNOSIS — M9901 Segmental and somatic dysfunction of cervical region: Secondary | ICD-10-CM | POA: Diagnosis not present

## 2022-07-13 MED ORDER — PROGESTERONE MICRONIZED 100 MG PO CAPS: 100.0000 mg | ORAL_CAPSULE | Freq: Every day | ORAL | 3 refills | Status: DC

## 2022-07-13 NOTE — Progress Notes (Signed)
Complete physical exam  Patient: Sheila Harris   DOB: Sep 23, 1955   67 y.o. Female  MRN: [id number]  Subjective:    Chief Complaint  Patient presents with   Annual Exam    Sheila Harris is a 67 y.o. female who presents today for a complete physical exam. She reports consuming a general diet. She generally feels well.  She does not have additional problems to discuss today. She takes her dog to volunteer as a support animal and really enjoys it.    She is having a lot of dental issues going on.  She has a recurring abscess on one of her front lower teeth.  But because she was on a bisphosphonate for some time they have avoided removing the tooth.  She has been off the bisphosphonate for a year now.   Most recent fall risk assessment:    07/13/2022    8:33 AM  [place] in the past year? 0  Number falls in past yr: 0  Injury with Fall? 0  Risk for fall due to : No Fall Risks  Follow up Falls evaluation completed     Most recent depression screenings:    07/13/2022    9:07 AM 07/13/2022    8:33 AM  PHQ 2/9 Scores  PHQ - 2 Score 0 0        Patient Care Team: Hali Marry, MD as PCP - General (Family Medicine) Hecox, Katlin (Psychology) Richmond Campbell, MD as Consulting Physician (Gastroenterology)   Outpatient Medications Prior to Visit  Medication Sig   acyclovir ointment (ZOVIRAX) 5 % Apply 1 Application topically every 3 (three) hours.   AMBULATORY NON FORMULARY MEDICATION Medication Name: Estradiol '2mg'$ /Gm Cream. Apply 2 clicks twice daily to inner thighs.   ascorbic acid (VITAMIN C) 100 MG tablet Take by mouth.   b complex vitamins tablet Take by mouth.   CALCIUM PO Take 1,500 mg by mouth daily.   clonazePAM (KLONOPIN) 0.5 MG tablet TAKE 1/2 TABLET BY MOUTH EVERY NIGHT AT BEDTIME AS NEEDED FOR ANXIETY   levothyroxine (SYNTHROID) 75 MCG tablet TAKE 1 TABLET BY MOUTH DAILY BEFORE BREAKFAST EXCEPT TAKE  1/2 TABLET BY MOUTH  DAILY ON SUNDAY    liothyronine (CYTOMEL) 25 MCG tablet TAKE 1/2 TABLET BY MOUTH DAILY   MAGNESIUM PO Take 1,000 mg by mouth daily.   Multiple Vitamin (THERA) TABS Take by mouth.   valACYclovir (VALTREX) 500 MG tablet TAKE 1 TABLET BY MOUTH TWICE A DAY   VITAMIN D PO Take 2,500 mg by mouth daily.   [DISCONTINUED] progesterone (PROMETRIUM) 100 MG capsule TAKE ONE CAPSULE BY MOUTH EVERY NIGHT AT BEDTIME   No facility-administered medications prior to visit.    ROS        Objective:     BP 127/66 (BP Location: Left Arm, Patient Position: Sitting, Cuff Size: Small)   Pulse 75   Ht '5\' 6"'$  (1.676 m)   Wt 136 lb (61.7 kg)   LMP 04/03/2010   SpO2 96%   BMI 21.95 kg/m    Physical Exam Vitals and nursing note reviewed.  Constitutional:      Appearance: She is well-developed.  HENT:     Head: Normocephalic and atraumatic.     Right Ear: Tympanic membrane, ear canal and external ear normal.     Left Ear: Tympanic membrane, ear canal and external ear normal.     Nose: Nose normal.     Mouth/Throat:  Pharynx: Oropharynx is clear.  Eyes:     Conjunctiva/sclera: Conjunctivae normal.     Pupils: Pupils are equal, round, and reactive to light.  Neck:     Thyroid: No thyromegaly.  Cardiovascular:     Rate and Rhythm: Normal rate and regular rhythm.     Heart sounds: Normal heart sounds.  Pulmonary:     Effort: Pulmonary effort is normal.     Breath sounds: Normal breath sounds. No wheezing.  Abdominal:     General: Bowel sounds are normal.     Palpations: Abdomen is soft.  Musculoskeletal:     Cervical back: Neck supple.  Lymphadenopathy:     Cervical: No cervical adenopathy.  Skin:    General: Skin is warm and dry.  Neurological:     Mental Status: She is alert and oriented to person, place, and time.  Psychiatric:        Behavior: Behavior normal.      No results found for any visits on 07/13/22.     Assessment & Plan:    Routine Health Maintenance and Physical  Exam  Immunization History  Administered Date(s) Administered   Influenza Split 03/25/2017, 03/15/2018, 02/12/2019, 04/06/2020   Influenza, High Dose Seasonal PF 03/02/2021   Influenza,inj,Quad PF,6+ Mos 03/15/2018   PFIZER(Purple Top)SARS-COV-2 Vaccination 08/22/2019, 09/15/2019, 05/04/2020   Pfizer Covid-19 Vaccine Bivalent Booster 71yr & up 03/04/2021   Td 06/19/2002   Tdap 04/19/2012   Zoster, Live 01/14/2015    Health Maintenance  Topic Date Due   COVID-19 Vaccine (5 - 2023-24 season) 07/29/2022 (Originally 02/17/2022)   INFLUENZA VACCINE  09/17/2022 (Originally 01/17/2022)   Zoster Vaccines- Shingrix (1 of 2) 10/12/2022 (Originally 12/09/2004)   Hepatitis C Screening  12/27/2022 (Originally 12/09/1972)   Pneumonia Vaccine 68 Years old (1 - PCV) 07/14/2023 (Originally 12/10/2019)   Medicare Annual Wellness (AWV)  12/27/2022   MAMMOGRAM  02/10/2024   COLONOSCOPY (Pts 45-49yrInsurance coverage will need to be confirmed)  11/23/2024   DEXA SCAN  Completed   HPV VACCINES  Aged Out   DTaP/Tdap/Td  Discontinued    Discussed health benefits of physical activity, and encouraged her to engage in regular exercise appropriate for her age and condition.  Problem List Items Addressed This Visit       Endocrine   Hypothyroidism   Relevant Orders   Lipid Panel w/reflex Direct LDL   COMPLETE METABOLIC PANEL WITH GFR   CBC   Ferritin   TSH   B12     Musculoskeletal and Integument   Osteoporosis   Relevant Orders   CBC   VITAMIN D 25 Hydroxy (Vit-D Deficiency, Fractures)   B12     Other   Iron deficiency   Relevant Orders   CBC   Ferritin   VITAMIN D 25 Hydroxy (Vit-D Deficiency, Fractures)   B12   Other Visit Diagnoses     Wellness examination    -  Primary   Relevant Orders   B12   Vitamin deficiency       Relevant Orders   VITAMIN D 25 Hydroxy (Vit-D Deficiency, Fractures)   B12   Screening, lipid       Relevant Orders   Lipid Panel w/reflex Direct LDL   B12        Keep up a regular exercise program and make sure you are eating a healthy diet Try to eat 4 servings of dairy a day, or if you are lactose intolerant take a calcium with vitamin D  daily.  Encouraged her to get her shingles and tetanus vaccine done at the local pharmacy.  She declined pneumonia vaccine today. Will get up to date lab. Due to recheck iron.    No follow-ups on file.     [name], MD

## 2022-07-13 NOTE — Patient Instructions (Signed)
Encouraged you to get your Tdap and your Shingrix vaccines at the pharmacy

## 2022-07-14 ENCOUNTER — Encounter: Payer: Self-pay | Admitting: Family Medicine

## 2022-07-14 LAB — LIPID PANEL W/REFLEX DIRECT LDL
Cholesterol: 176 mg/dL (ref <200)
HDL: 65 mg/dL (ref >=50)
LDL Cholesterol (Calc): 97 mg/dL (calc)
Non-HDL Cholesterol (Calc): 111 mg/dL (calc) (ref <130)
Total CHOL/HDL Ratio: 2.7 (calc) (ref <5.0)
Triglycerides: 55 mg/dL (ref <150)

## 2022-07-14 LAB — CBC
HCT: 45.2 % — ABNORMAL HIGH (ref 35.0–45.0)
Hemoglobin: 15 g/dL (ref 11.7–15.5)
MCH: 31.8 pg (ref 27.0–33.0)
MCHC: 33.2 g/dL (ref 32.0–36.0)
MCV: 96 fL (ref 80.0–100.0)
MPV: 10.9 fL (ref 7.5–12.5)
Platelets: 235 10*3/uL (ref 140–400)
RBC: 4.71 10*6/uL (ref 3.80–5.10)
RDW: 13.9 % (ref 11.0–15.0)
WBC: 6.4 10*3/uL (ref 3.8–10.8)

## 2022-07-14 LAB — COMPLETE METABOLIC PANEL WITH GFR
AG Ratio: 2.2 (calc) (ref 1.0–2.5)
ALT: 19 U/L (ref 6–29)
AST: 23 U/L (ref 10–35)
Albumin: 4.6 g/dL (ref 3.6–5.1)
Alkaline phosphatase (APISO): 67 U/L (ref 37–153)
BUN: 14 mg/dL (ref 7–25)
CO2: 27 mmol/L (ref 20–32)
Calcium: 9.4 mg/dL (ref 8.6–10.4)
Chloride: 105 mmol/L (ref 98–110)
Creat: 0.75 mg/dL (ref 0.50–1.05)
Globulin: 2.1 g/dL (calc) (ref 1.9–3.7)
Glucose, Bld: 86 mg/dL (ref 65–99)
Potassium: 4.4 mmol/L (ref 3.5–5.3)
Sodium: 141 mmol/L (ref 135–146)
Total Bilirubin: 0.7 mg/dL (ref 0.2–1.2)
Total Protein: 6.7 g/dL (ref 6.1–8.1)
eGFR: 87 mL/min/{1.73_m2} (ref >=60)

## 2022-07-14 LAB — VITAMIN D 25 HYDROXY (VIT D DEFICIENCY, FRACTURES): Vit D, 25-Hydroxy: 71 ng/mL (ref 30–100)

## 2022-07-14 LAB — VITAMIN B12: Vitamin B-12: 522 pg/mL (ref 200–1100)

## 2022-07-14 LAB — TSH: TSH: 2.14 mIU/L (ref 0.40–4.50)

## 2022-07-14 LAB — FERRITIN: Ferritin: 15 ng/mL — ABNORMAL LOW (ref 16–288)

## 2022-07-17 DIAGNOSIS — H35453 Secondary pigmentary degeneration, bilateral: Secondary | ICD-10-CM | POA: Diagnosis not present

## 2022-07-17 DIAGNOSIS — H35363 Drusen (degenerative) of macula, bilateral: Secondary | ICD-10-CM | POA: Diagnosis not present

## 2022-07-17 DIAGNOSIS — H35723 Serous detachment of retinal pigment epithelium, bilateral: Secondary | ICD-10-CM | POA: Diagnosis not present

## 2022-07-17 DIAGNOSIS — H25813 Combined forms of age-related cataract, bilateral: Secondary | ICD-10-CM | POA: Diagnosis not present

## 2022-07-17 DIAGNOSIS — H353132 Nonexudative age-related macular degeneration, bilateral, intermediate dry stage: Secondary | ICD-10-CM | POA: Diagnosis not present

## 2022-07-17 NOTE — Telephone Encounter (Signed)
We can test for estradiol if she would like.  She will likely have to come back to the lab though and have that drawn.  If she does want to have it tested we can do estradiol, FSH and LH.

## 2022-07-17 NOTE — Progress Notes (Signed)
Hi Sheila Harris, your hemoglobin looks good at 15.  Iron stores are improving.  They were 6 2 months ago now up to 15.  Our goal is to get you all the way up to 40.  Please keep taking your iron supplement and we will plan to recheck you again in 2 to 3 months.  The [place] panel and cholesterol look good.  Thyroid look great at 2.1.  Vitamin D and B12 look great.

## 2022-07-18 ENCOUNTER — Other Ambulatory Visit: Payer: Self-pay

## 2022-07-18 DIAGNOSIS — Z79890 Hormone replacement therapy (postmenopausal): Secondary | ICD-10-CM

## 2022-07-18 NOTE — Telephone Encounter (Signed)
Pt called back and would like to proceed with having the blood work

## 2022-07-18 NOTE — Telephone Encounter (Signed)
Orders placed for Estradiol. FSH/LH.

## 2022-07-20 DIAGNOSIS — Z79890 Hormone replacement therapy (postmenopausal): Secondary | ICD-10-CM | POA: Diagnosis not present

## 2022-07-20 DIAGNOSIS — M5417 Radiculopathy, lumbosacral region: Secondary | ICD-10-CM | POA: Diagnosis not present

## 2022-07-20 DIAGNOSIS — M7741 Metatarsalgia, right foot: Secondary | ICD-10-CM | POA: Diagnosis not present

## 2022-07-21 LAB — FSH/LH
FSH: 64.7 m[IU]/mL
LH: 26 m[IU]/mL

## 2022-07-21 LAB — ESTRADIOL: Estradiol: 25 pg/mL

## 2022-07-21 NOTE — Progress Notes (Signed)
Hi Sheila Harris, labs are consistent with being postmenopausal and nothing is too far out of the boundaries.  It actually looks pretty normal.

## 2022-07-26 ENCOUNTER — Telehealth: Payer: Self-pay

## 2022-07-26 DIAGNOSIS — M5412 Radiculopathy, cervical region: Secondary | ICD-10-CM

## 2022-07-26 NOTE — Telephone Encounter (Signed)
Sheila Harris called and states she would like a referral for PT.

## 2022-07-27 NOTE — Telephone Encounter (Signed)
Referral placed.

## 2022-07-31 NOTE — Therapy (Signed)
OUTPATIENT PHYSICAL THERAPY CERVICAL EVALUATION   Patient Name: Sheila Harris MRN: [id number] DOB:05/25/1956, 67 y.o., female Today's Date: 08/01/2022  END OF SESSION:  PT End of Session - 08/01/22 1319     Visit Number 1    Number of Visits 16    Date for PT Re-Evaluation 09/26/22    Authorization Type [employer] $15 copay; $3200 max    PT Start Time 1515    PT Stop Time 1556    PT Time Calculation (min) 41 min    Activity Tolerance Patient tolerated treatment well             Past Medical History:  Diagnosis Date   Genital herpes    Macular degeneration    Thyroid disease    No past surgical history on file. Patient Active Problem List   Diagnosis Date Noted   Metatarsalgia of right foot 03/06/2022   Iron deficiency 02/23/2022   Dry age-related macular degeneration 12/08/2021   Chronic cough 08/11/2021   Chronic foot pain, right 07/12/2021   Left shoulder pain 05/05/2021   Hormone replacement therapy (HRT) 07/08/2020   Corn of foot right, 4/5 interspace 06/07/2020   Recurrent low back pain 11/19/2019   Herpes infection 11/19/2019   Osteoporosis 11/19/2019   Factor V Leiden (HCC) 11/19/2019   Hypothyroidism 11/19/2019   Anxiety 11/19/2019   Trigger finger of right hand 06/07/2016   Carpal tunnel syndrome, right 02/22/2016   Primary osteoarthritis of right hand 12/30/2015   Right cervical radiculopathy 12/30/2015   Trigger finger of left hand 10/08/2015   Pes cavus 11/21/2013   Right lumbar radiculopathy 11/21/2013    PCP: Charlann Boxer  REFERRING PROVIDER: Dr Rodney Langton   REFERRING DIAG: Cervical Radiculopathy  THERAPY DIAG:  Cervical dysfunction  Other symptoms and signs involving the musculoskeletal system  TMJ dysfunction  Muscle weakness (generalized)  Rationale for Evaluation and Treatment: Rehabilitation  ONSET DATE: 06/02/22  SUBJECTIVE:                                                                                                                                                                                                          SUBJECTIVE STATEMENT: Patient reports that she has had neck pain on the off in the past 3-4 months but she as had pain in the neck over the past several years. She has had some pain in the Rt TMJ over the years as well. She has an overbite and jaw is tilted. She has seen a chiropractor in the past. She now has difficulty chewing  and has not chewed in the past 2 weeks.   PERTINENT HISTORY:  Chronic pain in the TMJ and neck; costochondritis; arthritis; osteoarthritis   PAIN:  Are you having pain? Yes: NPRS scale: 6/10 Pain location: Lt neck and jaw area; sometimes Rt  Pain description: sharp Aggravating factors: exercising with UE's overhead; sleeping on side   Relieving factors: massaging in the neck; heat; ice   PRECAUTIONS: None  WEIGHT BEARING RESTRICTIONS: No  FALLS:  Has patient fallen in last 6 months? No  LIVING ENVIRONMENT: Lives with: lives with their spouse Lives in: House/apartment   OCCUPATION: retired - household chores; walking 30-60 min 6 days/wk   PLOF: Independent  PATIENT GOALS: strengthen areas to help decrease pain   NEXT MD VISIT: None scheduled  OBJECTIVE:   DIAGNOSTIC FINDINGS:  Xray 05/10/22: No fracture, dislocation or subluxation. No spondylolisthesis. No osteolytic or osteoblastic changes. Prevertebral and cervical cranial soft tissues are unremarkable. Degenerative disc disease noted with disc space narrowing and marginal osteophytes at C5-6 through C7-T1.  PATIENT SURVEYS:  FOTO 47 goal 64  COGNITION: Overall cognitive status: Within functional limits for tasks assessed  SENSATION: WFL  POSTURE: Patient presents with head forward posture with increased thoracic kyphosis; shoulders rounded and elevated; scapulae abducted and rotated along the thoracic spine; head of the humerus anterior in  orientation.  PALPATION: Muscular tightness ant/lat/posterior cervical musculature; TMD/jaw area Lt > Rt with tenderness into the Lt temple and scalp    CERVICAL ROM: tight end ranges   Active ROM A/PROM (deg) eval  Flexion 46  Extension 45  Right lateral flexion 27  Left lateral flexion 39  Right rotation 60  Left rotation 49   (Blank rows = not tested)  UPPER EXTREMITY ROM: WFL's bilat UE's     JAW: TMD opening and lateral deviation limited   UPPER EXTREMITY MMT: WFL's bilat UE's                                                                                                                               [facility] Adult PT Treatment:                                                DATE: 08/01/22 Therapeutic Exercise: Chin tuck supine 10 sec x 5 Chin tuck with noodle 10 sec x 5 Scap squeeze with noodle 10 sec x 5 L's with noodle x 10  W's with noodle x 10  Manual Therapy: STM with pt supine working through the post/lat/ant cervical musculature and TMD musculature  Neuromuscular re-ed: Initiated postural correction engaging posterior shoulder girdle musculature  Therapeutic Activity:  Modalities: None  Self Care: Postural correction    PATIENT EDUCATION:  Education details: POC; HEP  Person educated: Patient Education method: Explanation, Demonstration, Actor cues, Verbal cues, and Handouts Education  comprehension: verbalized understanding, returned demonstration, verbal cues required, tactile cues required, and needs further education  HOME EXERCISE PROGRAM: Access Code: [email or web address] URL: [email or web address] Date: 08/01/2022 Prepared by: Corlis Leak  Exercises - Supine Cervical Retraction with Towel  - 2 x daily - 7 x weekly - 1 sets - 5-10 reps - 10 sec  hold - Standing Cervical Retraction  - 2 x daily - 7 x weekly - 1 sets - 10 reps - 5-10 sec  hold - Seated Scapular Retraction  - 2 x daily - 7 x weekly - 1-2 sets - 10 reps - 10 sec  hold - Shoulder  External Rotation and Scapular Retraction  - 3 x daily - 7 x weekly - 1 sets - 10 reps - 3-5 sec   hold - Shoulder External Rotation in 45 Degrees Abduction  - 2 x daily - 7 x weekly - 1-2 sets - 10 reps - 3 sec  hold  ASSESSMENT:  CLINICAL IMPRESSION: Patient is a 67 y.o. female who was seen today for physical therapy evaluation and treatment for cervical radiculopathy and TMD dysfunction Lt > Rt. Symptoms are chronic in nature with episodic flare up with most recent flare up ~ 3-4 months ago. Symptoms include pain in the neck and jaw as well as into the shoulder area Lt > Rt. Patient presents with poor posture and alignment; limited cervical mobility and ROM; discomfort with TMD opening and lateral deviation; muscular tightness to palpation through the ant/lat/posterior cervical musculature and jaw areas; weakness in postural musculature of upper core; pain with functional activities including chewing. Patient will benefit from PT to address problems identified.   OBJECTIVE IMPAIRMENTS: decreased mobility, decreased ROM, decreased strength, hypomobility, increased fascial restrictions, increased muscle spasms, impaired flexibility, improper body mechanics, postural dysfunction, and pain.   ACTIVITY LIMITATIONS: carrying, lifting, and eating   PARTICIPATION LIMITATIONS: UE exercise overhead; lifting; walking   PERSONAL FACTORS: Past/current experiences, Time since onset of injury/illness/exacerbation, and  comorbidities: arthritis; osteoporosis; chronic cervical and TMD dysfunction  are also affecting patient's functional outcome.   REHAB POTENTIAL: Good  CLINICAL DECISION MAKING: Stable/uncomplicated  EVALUATION COMPLEXITY: Low   GOALS: Goals reviewed with patient? Yes  SHORT TERM GOALS: Target date: 08/28/2022  Independent in initial HEP  Baseline:  Goal status: INITIAL  2.  Instruct patient in improved posture and alignment with functional activities and sitting/standing   Baseline:  Goal status: INITIAL   LONG TERM GOALS: Target date: 09/26/2022  Increase cervical mobility and ROM to WNL's throughout with decreased stiffness with ROM  Baseline:  Goal status: INITIAL  2.  Improve posture and alignment with patient to demonstrate improved upright posture with posterior shoulder girdle musculature engaged  Baseline:  Goal status: INITIAL  3.  Improve TMD opening with patient to report ability chew foods of normal textures  Baseline:  Goal status: INITIAL  4.  Decrease pain in neck and jaw by 50-75% allowing patient to exercise and perform normal functional activities  Baseline:  Goal status: INITIAL  5.  Independent in HEP including aquatic program as indicated  Baseline:  Goal status: INITIAL  6.  Improve functional limitation score to 56  Baseline:  Goal status: INITIAL   PLAN:  PT FREQUENCY: 2x/week  PT DURATION: 8 weeks  PLANNED INTERVENTIONS: Therapeutic exercises, Therapeutic activity, Neuromuscular re-education, Patient/Family education, Self Care, Joint mobilization, Aquatic Therapy, Dry Needling, Electrical stimulation, Spinal mobilization, Cryotherapy, Moist heat, Taping, Traction, Ultrasound, Ionotophoresis 4mg /ml Dexamethasone, Manual therapy, and Re-evaluation  PLAN FOR  NEXT SESSION: review and progress exercise; continue with postural correction and eduction; manual work, DN, modalities as indicated    [employer], PT 08/01/2022, 2:10 PM

## 2022-08-01 ENCOUNTER — Ambulatory Visit: Payer: PPO | Attending: Sports Medicine | Admitting: Rehabilitative and Restorative Service Providers"

## 2022-08-01 ENCOUNTER — Other Ambulatory Visit: Payer: Self-pay

## 2022-08-01 DIAGNOSIS — M26609 Unspecified temporomandibular joint disorder, unspecified side: Secondary | ICD-10-CM | POA: Diagnosis not present

## 2022-08-01 DIAGNOSIS — M6281 Muscle weakness (generalized): Secondary | ICD-10-CM | POA: Insufficient documentation

## 2022-08-01 DIAGNOSIS — R29898 Other symptoms and signs involving the musculoskeletal system: Secondary | ICD-10-CM | POA: Insufficient documentation

## 2022-08-01 DIAGNOSIS — M539 Dorsopathy, unspecified: Secondary | ICD-10-CM | POA: Insufficient documentation

## 2022-08-01 DIAGNOSIS — M5412 Radiculopathy, cervical region: Secondary | ICD-10-CM | POA: Insufficient documentation

## 2022-08-02 ENCOUNTER — Encounter (INDEPENDENT_AMBULATORY_CARE_PROVIDER_SITE_OTHER): Payer: PPO | Admitting: Sports Medicine

## 2022-08-02 DIAGNOSIS — M5412 Radiculopathy, cervical region: Secondary | ICD-10-CM | POA: Diagnosis not present

## 2022-08-03 ENCOUNTER — Ambulatory Visit: Payer: PPO | Admitting: Rehabilitative and Restorative Service Providers"

## 2022-08-03 ENCOUNTER — Encounter: Payer: Self-pay | Admitting: Rehabilitative and Restorative Service Providers"

## 2022-08-03 DIAGNOSIS — M26609 Unspecified temporomandibular joint disorder, unspecified side: Secondary | ICD-10-CM

## 2022-08-03 DIAGNOSIS — M6281 Muscle weakness (generalized): Secondary | ICD-10-CM

## 2022-08-03 DIAGNOSIS — M5412 Radiculopathy, cervical region: Secondary | ICD-10-CM | POA: Diagnosis not present

## 2022-08-03 DIAGNOSIS — M539 Dorsopathy, unspecified: Secondary | ICD-10-CM

## 2022-08-03 DIAGNOSIS — R29898 Other symptoms and signs involving the musculoskeletal system: Secondary | ICD-10-CM

## 2022-08-03 MED ORDER — IBUPROFEN 600 MG PO TABS: 600.0000 mg | ORAL_TABLET | Freq: Three times a day (TID) | ORAL | 0 refills | Status: DC | PRN

## 2022-08-03 MED ORDER — CYCLOBENZAPRINE HCL 5 MG PO TABS: 2.5000 mg | ORAL_TABLET | Freq: Every day | ORAL | 0 refills | Status: DC

## 2022-08-03 NOTE — Therapy (Signed)
OUTPATIENT PHYSICAL THERAPY CERVICAL TREATMENT   Patient Name: Sheila Harris MRN: [id number] DOB:Nov 02, 1955, 67 y.o., female Today's Date: 08/03/2022  END OF SESSION:  PT End of Session - 08/03/22 0853     Visit Number 2    Number of Visits 16    Date for PT Re-Evaluation 09/26/22    Authorization Type [employer] $15 copay; $3200 max    PT Start Time 0851   late for appt   PT Stop Time 0938    PT Time Calculation (min) 47 min    Activity Tolerance Patient tolerated treatment well             Past Medical History:  Diagnosis Date   Genital herpes    Macular degeneration    Thyroid disease    History reviewed. No pertinent surgical history. Patient Active Problem List   Diagnosis Date Noted   Metatarsalgia of right foot 03/06/2022   Iron deficiency 02/23/2022   Dry age-related macular degeneration 12/08/2021   Chronic cough 08/11/2021   Chronic foot pain, right 07/12/2021   Left shoulder pain 05/05/2021   Hormone replacement therapy (HRT) 07/08/2020   Corn of foot right, 4/5 interspace 06/07/2020   Recurrent low back pain 11/19/2019   Herpes infection 11/19/2019   Osteoporosis 11/19/2019   Factor V Leiden ([place]) 11/19/2019   Hypothyroidism 11/19/2019   Anxiety 11/19/2019   Trigger finger of right hand 06/07/2016   Carpal tunnel syndrome, right 02/22/2016   Primary osteoarthritis of right hand 12/30/2015   Right cervical radiculopathy 12/30/2015   Trigger finger of left hand 10/08/2015   Pes cavus 11/21/2013   Right lumbar radiculopathy 11/21/2013    PCP: Doran Heater  REFERRING PROVIDER: Dr Aundria Mems   REFERRING DIAG: Cervical Radiculopathy  THERAPY DIAG:  Cervical dysfunction  Other symptoms and signs involving the musculoskeletal system  TMJ dysfunction  Muscle weakness (generalized)  Rationale for Evaluation and Treatment: Rehabilitation  ONSET DATE: 06/02/22  SUBJECTIVE:                                                                                                                                                                                                          SUBJECTIVE STATEMENT: Patient reports persistent tightness in the neck and jaw area.   PERTINENT HISTORY:  neck pain on the off in the past 3-4 months but she as had pain in the neck over the past several years. She has had some pain in the Rt TMJ over the years as well. She has an overbite and jaw is tilted.  She has seen a chiropractor in the past. She now has difficulty chewing and has not chewed in the past 2 weeks Chronic pain in the TMJ and neck; costochondritis; arthritis; osteoarthritis   PAIN:  Are you having pain? Yes: NPRS scale: 4/10 Pain location: Lt neck and jaw area; sometimes Rt  Pain description: sharp Aggravating factors: exercising with UE's overhead; sleeping on side   Relieving factors: massaging in the neck; heat; ice   PRECAUTIONS: None  WEIGHT BEARING RESTRICTIONS: No  FALLS:  Has patient fallen in last 6 months? No   OCCUPATION: retired - household chores; walking 30-60 min 6 days/wk   PATIENT GOALS: strengthen areas to help decrease pain   NEXT MD VISIT: None scheduled  OBJECTIVE:   DIAGNOSTIC FINDINGS:  Xray 05/10/22: No fracture, dislocation or subluxation. No spondylolisthesis. No osteolytic or osteoblastic changes. Prevertebral and cervical cranial soft tissues are unremarkable. Degenerative disc disease noted with disc space narrowing and marginal osteophytes at C5-6 through C7-T1.  PATIENT SURVEYS:  FOTO 57 goal 39  POSTURE: Patient presents with head forward posture with increased thoracic kyphosis; shoulders rounded and elevated; scapulae abducted and rotated along the thoracic spine; head of the humerus anterior in orientation.  PALPATION: Muscular tightness ant/lat/posterior cervical musculature; TMD/jaw area Lt > Rt with tenderness into the Lt temple and scalp    CERVICAL ROM: tight end  ranges   Active ROM A/PROM (deg) eval  Flexion 46  Extension 45  Right lateral flexion 27  Left lateral flexion 39  Right rotation 60  Left rotation 49   (Blank rows = not tested)  UPPER EXTREMITY ROM: WFL's bilat UE's     JAW: TMD opening and lateral deviation limited   UPPER EXTREMITY MMT: WFL's bilat UE's                                                                                                                               [facility] Adult PT Treatment:                                                 DATE: 08/03/22 Therapeutic Exercise: Chin tuck supine 10 sec x 5 Chin tuck with noodle 10 sec x 5 Scap squeeze with noodle 10 sec x 5 L's with noodle x 10  L's with noodle yellow TB 3 sec hold x 10 W's with noodle x 10  W's with noodle yellow TB 3 sec hold x 10 Supine scap squeeze 10 sec x 10  Manual Therapy: STM with pt supine working through the post/lat/ant cervical musculature and TMD musculature  Trigger Point Dry-Needling  Treatment instructions: Expect mild to moderate muscle soreness. S/S of pneumothorax if dry needled over a lung field, and to seek immediate medical attention should they occur. Patient verbalized understanding of these instructions and  education.  Patient Consent Given: Yes Education handout provided: Previously provided Muscles treated: bilat SCM; scaleni Electrical stimulation performed: No Parameters: N/A Treatment response/outcome: decreased palpable tightness   Neuromuscular re-ed: Initiated postural correction engaging posterior shoulder girdle musculature  Modalities: None  Self Care: Postural correction   DATE: 08/01/22 Therapeutic Exercise: Chin tuck supine 10 sec x 5 Chin tuck with noodle 10 sec x 5 Scap squeeze with noodle 10 sec x 5 L's with noodle x 10  W's with noodle x 10  Manual Therapy: STM with pt supine working through the post/lat/ant cervical musculature and TMD musculature  Neuromuscular re-ed: Initiated postural  correction engaging posterior shoulder girdle musculature  Therapeutic Activity:  Modalities: None  Self Care: Postural correction    PATIENT EDUCATION:  Education details: POC; HEP  Person educated: Patient Education method: Consulting civil engineer, Demonstration, Corporate treasurer cues, Verbal cues, and Handouts Education comprehension: verbalized understanding, returned demonstration, verbal cues required, tactile cues required, and needs further education  HOME EXERCISE PROGRAM: Access Code: [email or web address] URL: [email or web address] Date: 08/03/2022 Prepared by: Gillermo Murdoch  Exercises - Supine Cervical Retraction with Towel  - 2 x daily - 7 x weekly - 1 sets - 5-10 reps - 10 sec  hold - Standing Cervical Retraction  - 2 x daily - 7 x weekly - 1 sets - 10 reps - 5-10 sec  hold - Seated Scapular Retraction  - 2 x daily - 7 x weekly - 1-2 sets - 10 reps - 10 sec  hold - Shoulder External Rotation and Scapular Retraction  - 3 x daily - 7 x weekly - 1 sets - 10 reps - 3-5 sec   hold - Shoulder External Rotation in 45 Degrees Abduction  - 2 x daily - 7 x weekly - 1-2 sets - 10 reps - 3 sec  hold - Shoulder External Rotation and Scapular Retraction with Resistance  - 2 x daily - 7 x weekly - 1 sets - 10 reps - 3-5 sec  hold - Shoulder W - External Rotation with Resistance  - 2 x daily - 7 x weekly - 1-2 sets - 10 reps - 3 sec  hold - Supine Scapular Retraction  - 2 x daily - 7 x weekly - 1 sets - 10 reps - 5-10 sec  hold  ASSESSMENT:  CLINICAL IMPRESSION: Patient reports persistent pain in the jaw and neck area. Reviewed and progressed with exercises. Trial of dry needling and manual work through the jaw and cervical musculature.   OBJECTIVE IMPAIRMENTS: pain in the neck and jaw as well as into the shoulder area Lt > Rt. Patient presents with poor posture and alignment; limited cervical mobility and ROM; discomfort with TMD opening and lateral deviation; muscular tightness to palpation through the  ant/lat/posterior cervical musculature and jaw areas; weakness in postural musculature of upper core; pain with functional activities including chewing.decreased mobility, decreased ROM, decreased strength, hypomobility, increased fascial restrictions, increased muscle spasms, impaired flexibility, improper body mechanics, postural dysfunction, and pain.    GOALS: Goals reviewed with patient? Yes  SHORT TERM GOALS: Target date: 08/28/2022  Independent in initial HEP  Baseline:  Goal status: INITIAL  2.  Instruct patient in improved posture and alignment with functional activities and sitting/standing  Baseline:  Goal status: INITIAL   LONG TERM GOALS: Target date: 09/26/2022  Increase cervical mobility and ROM to WNL's throughout with decreased stiffness with ROM  Baseline:  Goal status: INITIAL  2.  Improve posture and alignment with patient to demonstrate  improved upright posture with posterior shoulder girdle musculature engaged  Baseline:  Goal status: INITIAL  3.  Improve TMD opening with patient to report ability chew foods of normal textures  Baseline:  Goal status: INITIAL  4.  Decrease pain in neck and jaw by 50-75% allowing patient to exercise and perform normal functional activities  Baseline:  Goal status: INITIAL  5.  Independent in HEP including aquatic program as indicated  Baseline:  Goal status: INITIAL  6.  Improve functional limitation score to 56  Baseline:  Goal status: INITIAL   PLAN:  PT FREQUENCY: 2x/week  PT DURATION: 8 weeks  PLANNED INTERVENTIONS: Therapeutic exercises, Therapeutic activity, Neuromuscular re-education, Patient/Family education, Self Care, Joint mobilization, Aquatic Therapy, Dry Needling, Electrical stimulation, Spinal mobilization, Cryotherapy, Moist heat, Taping, Traction, Ultrasound, Ionotophoresis 33m/ml Dexamethasone, Manual therapy, and Re-evaluation  PLAN FOR NEXT SESSION: review and progress exercise; continue with  postural correction and eduction; manual work, DN, modalities as indicated    [employer] PT 08/03/2022, 8:54 AM

## 2022-08-03 NOTE — Telephone Encounter (Signed)
I spent 5 total minutes of online digital evaluation and management services in this patient-initiated request for online care. 

## 2022-08-08 ENCOUNTER — Ambulatory Visit: Payer: PPO | Admitting: Rehabilitative and Restorative Service Providers"

## 2022-08-08 ENCOUNTER — Encounter: Payer: PPO | Admitting: Physical Therapy

## 2022-08-08 ENCOUNTER — Encounter: Payer: Self-pay | Admitting: Rehabilitative and Restorative Service Providers"

## 2022-08-08 DIAGNOSIS — M6281 Muscle weakness (generalized): Secondary | ICD-10-CM

## 2022-08-08 DIAGNOSIS — M5412 Radiculopathy, cervical region: Secondary | ICD-10-CM | POA: Diagnosis not present

## 2022-08-08 DIAGNOSIS — R29898 Other symptoms and signs involving the musculoskeletal system: Secondary | ICD-10-CM

## 2022-08-08 DIAGNOSIS — M26609 Unspecified temporomandibular joint disorder, unspecified side: Secondary | ICD-10-CM

## 2022-08-08 DIAGNOSIS — M539 Dorsopathy, unspecified: Secondary | ICD-10-CM

## 2022-08-08 NOTE — Therapy (Signed)
OUTPATIENT PHYSICAL THERAPY CERVICAL TREATMENT   Patient Name: Sheila Harris MRN: [id number] DOB:1956/02/26, 67 y.o., female Today's Date: 08/08/2022  END OF SESSION:  PT End of Session - 08/08/22 0848     Visit Number 3    Number of Visits 16    Date for PT Re-Evaluation 09/26/22    Authorization Type [employer] $15 copay; $3200 max    PT Start Time 0847    PT Stop Time 0935    PT Time Calculation (min) 48 min    Activity Tolerance Patient tolerated treatment well             Past Medical History:  Diagnosis Date   Genital herpes    Macular degeneration    Thyroid disease    History reviewed. No pertinent surgical history. Patient Active Problem List   Diagnosis Date Noted   Metatarsalgia of right foot 03/06/2022   Iron deficiency 02/23/2022   Dry age-related macular degeneration 12/08/2021   Chronic cough 08/11/2021   Chronic foot pain, right 07/12/2021   Left shoulder pain 05/05/2021   Hormone replacement therapy (HRT) 07/08/2020   Corn of foot right, 4/5 interspace 06/07/2020   Recurrent low back pain 11/19/2019   Herpes infection 11/19/2019   Osteoporosis 11/19/2019   Factor V Leiden ([place]) 11/19/2019   Hypothyroidism 11/19/2019   Anxiety 11/19/2019   Trigger finger of right hand 06/07/2016   Carpal tunnel syndrome, right 02/22/2016   Primary osteoarthritis of right hand 12/30/2015   Right cervical radiculopathy 12/30/2015   Trigger finger of left hand 10/08/2015   Pes cavus 11/21/2013   Right lumbar radiculopathy 11/21/2013    PCP: Doran Heater  REFERRING PROVIDER: Dr Aundria Mems   REFERRING DIAG: Cervical Radiculopathy  THERAPY DIAG:  Cervical dysfunction  Other symptoms and signs involving the musculoskeletal system  TMJ dysfunction  Muscle weakness (generalized)  Rationale for Evaluation and Treatment: Rehabilitation  ONSET DATE: 06/02/22  SUBJECTIVE:                                                                                                                                                                                                          SUBJECTIVE STATEMENT: Patient reports persistent tightness in the neck and jaw area. Difficulty sleeping - has increased pain when lying on either side. Still on liquid diet but tried lettuce yesterday which irritated the jaws. Did OK with dry needling but reports acid taste in her mouth for a few days after DN   PERTINENT HISTORY:  neck pain on the off in the past  3-4 months but she as had pain in the neck over the past several years. She has had some pain in the Rt TMJ over the years as well. She has an overbite and jaw is tilted. She has seen a chiropractor in the past. She now has difficulty chewing and has not chewed in the past 2 weeks Chronic pain in the TMJ and neck; costochondritis; arthritis; osteoarthritis   PAIN:  Are you having pain? Yes: NPRS scale: 7/10 Pain location: Lt neck and jaw area; sometimes Rt  Pain description: sharp Aggravating factors: exercising with UE's overhead; sleeping on side   Relieving factors: massaging in the neck; heat; ice   PRECAUTIONS: None  WEIGHT BEARING RESTRICTIONS: No  FALLS:  Has patient fallen in last 6 months? No   OCCUPATION: retired - household chores; walking 30-60 min 6 days/wk   PATIENT GOALS: strengthen areas to help decrease pain   NEXT MD VISIT: None scheduled  OBJECTIVE:   DIAGNOSTIC FINDINGS:  Xray 05/10/22: No fracture, dislocation or subluxation. No spondylolisthesis. No osteolytic or osteoblastic changes. Prevertebral and cervical cranial soft tissues are unremarkable. Degenerative disc disease noted with disc space narrowing and marginal osteophytes at C5-6 through C7-T1.  PATIENT SURVEYS:  FOTO 57 goal 2  POSTURE: Patient presents with head forward posture with increased thoracic kyphosis; shoulders rounded and elevated; scapulae abducted and rotated along the thoracic spine;  head of the humerus anterior in orientation.  PALPATION: Muscular tightness ant/lat/posterior cervical musculature; TMD/jaw area Lt > Rt with tenderness into the Lt temple and scalp    CERVICAL ROM: tight end ranges   Active ROM A/PROM (deg) eval  Flexion 46  Extension 45  Right lateral flexion 27  Left lateral flexion 39  Right rotation 60  Left rotation 49   (Blank rows = not tested)  UPPER EXTREMITY ROM: WFL's bilat UE's     JAW: TMD opening and lateral deviation limited   UPPER EXTREMITY MMT: WFL's bilat UE's                                                                                                                               [facility] Adult PT Treatment:                                                 DATE: 08/08/22 Therapeutic Exercise: Chin tuck supine 10 sec x 5 Chin tuck with noodle 10 sec x 5 Scap squeeze with noodle 10 sec x 5 L's with noodle x 10  L's with noodle yellow TB 3 sec hold x 10 W's with noodle x 10  W's with noodle yellow TB 3 sec hold x 10 Supine scap squeeze 10 sec x 10  Row green TB 3 sec x 10  Bow and arrow green TB 3 sec x 10  Bow 45 deg green TB 3 sec x 10  Manual Therapy: STM with pt supine working through the post/lat/ant cervical musculature and TMD musculature  Trigger Point Dry-Needling  Treatment instructions: Expect mild to moderate muscle soreness. S/S of pneumothorax if dry needled over a lung field, and to seek immediate medical attention should they occur. Patient verbalized understanding of these instructions and education.  Patient Consent Given: Yes Education handout provided: Previously provided Muscles treated: bilat masseters; pterygoids; SCM; scaleni Electrical stimulation performed: No Parameters: N/A Treatment response/outcome: decreased palpable tightness   Neuromuscular re-ed: Initiated postural correction engaging posterior shoulder girdle musculature  Modalities: None  Self Care: Postural correction     DATE: 08/03/22 Therapeutic Exercise: Chin tuck supine 10 sec x 5 Chin tuck with noodle 10 sec x 5 Scap squeeze with noodle 10 sec x 5 L's with noodle x 10  L's with noodle yellow TB 3 sec hold x 10 W's with noodle x 10  W's with noodle yellow TB 3 sec hold x 10 Supine scap squeeze 10 sec x 10  Manual Therapy: STM with pt supine working through the post/lat/ant cervical musculature and TMD musculature  Trigger Point Dry-Needling  Treatment instructions: Expect mild to moderate muscle soreness. S/S of pneumothorax if dry needled over a lung field, and to seek immediate medical attention should they occur. Patient verbalized understanding of these instructions and education.  Patient Consent Given: Yes Education handout provided: Previously provided Muscles treated: bilat SCM; scaleni Electrical stimulation performed: No Parameters: N/A Treatment response/outcome: decreased palpable tightness   Neuromuscular re-ed: Initiated postural correction engaging posterior shoulder girdle musculature  Modalities: None  Self Care: Postural correction    PATIENT EDUCATION:  Education details: POC; HEP  Person educated: Patient Education method: Consulting civil engineer, Demonstration, Tactile cues, Verbal cues, and Handouts Education comprehension: verbalized understanding, returned demonstration, verbal cues required, tactile cues required, and needs further education  HOME EXERCISE PROGRAM: Access Code: [email or web address] URL: [email or web address] Date: 08/08/2022 Prepared by: Gillermo Murdoch  Exercises - Supine Cervical Retraction with Towel  - 2 x daily - 7 x weekly - 1 sets - 5-10 reps - 10 sec  hold - Standing Cervical Retraction  - 2 x daily - 7 x weekly - 1 sets - 10 reps - 5-10 sec  hold - Seated Scapular Retraction  - 2 x daily - 7 x weekly - 1-2 sets - 10 reps - 10 sec  hold - Shoulder External Rotation and Scapular Retraction  - 3 x daily - 7 x weekly - 1 sets - 10 reps - 3-5 sec    hold - Shoulder External Rotation in 45 Degrees Abduction  - 2 x daily - 7 x weekly - 1-2 sets - 10 reps - 3 sec  hold - Shoulder External Rotation and Scapular Retraction with Resistance  - 2 x daily - 7 x weekly - 1 sets - 10 reps - 3-5 sec  hold - Shoulder W - External Rotation with Resistance  - 2 x daily - 7 x weekly - 1-2 sets - 10 reps - 3 sec  hold - Supine Scapular Retraction  - 2 x daily - 7 x weekly - 1 sets - 10 reps - 5-10 sec  hold - Standing Bilateral Low Shoulder Row with Anchored Resistance  - 2 x daily - 7 x weekly - 1-3 sets - 10 reps - 2-3 sec  hold - Drawing Bow  - 1 x daily - 7 x weekly - 1 sets - 10  reps - 3 sec  hold - Standing High Row with Resistance  - 2 x daily - 7 x weekly - 1 sets - 10 reps - 3-5 sec  hold ASSESSMENT:  CLINICAL IMPRESSION: Patient reports persistent pain in the jaw and neck area. Reviewed and progressed with exercises. Continued dry needling and manual work through the jaw and cervical musculature.   OBJECTIVE IMPAIRMENTS: pain in the neck and jaw as well as into the shoulder area Lt > Rt. Patient presents with poor posture and alignment; limited cervical mobility and ROM; discomfort with TMD opening and lateral deviation; muscular tightness to palpation through the ant/lat/posterior cervical musculature and jaw areas; weakness in postural musculature of upper core; pain with functional activities including chewing.decreased mobility, decreased ROM, decreased strength, hypomobility, increased fascial restrictions, increased muscle spasms, impaired flexibility, improper body mechanics, postural dysfunction, and pain.    GOALS: Goals reviewed with patient? Yes  SHORT TERM GOALS: Target date: 08/28/2022  Independent in initial HEP  Baseline:  Goal status: INITIAL  2.  Instruct patient in improved posture and alignment with functional activities and sitting/standing  Baseline:  Goal status: INITIAL   LONG TERM GOALS: Target date:  09/26/2022  Increase cervical mobility and ROM to WNL's throughout with decreased stiffness with ROM  Baseline:  Goal status: INITIAL  2.  Improve posture and alignment with patient to demonstrate improved upright posture with posterior shoulder girdle musculature engaged  Baseline:  Goal status: INITIAL  3.  Improve TMD opening with patient to report ability chew foods of normal textures  Baseline:  Goal status: INITIAL  4.  Decrease pain in neck and jaw by 50-75% allowing patient to exercise and perform normal functional activities  Baseline:  Goal status: INITIAL  5.  Independent in HEP including aquatic program as indicated  Baseline:  Goal status: INITIAL  6.  Improve functional limitation score to 56  Baseline:  Goal status: INITIAL   PLAN:  PT FREQUENCY: 2x/week  PT DURATION: 8 weeks  PLANNED INTERVENTIONS: Therapeutic exercises, Therapeutic activity, Neuromuscular re-education, Patient/Family education, Self Care, Joint mobilization, Aquatic Therapy, Dry Needling, Electrical stimulation, Spinal mobilization, Cryotherapy, Moist heat, Taping, Traction, Ultrasound, Ionotophoresis 83m/ml Dexamethasone, Manual therapy, and Re-evaluation  PLAN FOR NEXT SESSION: review and progress exercise; continue with postural correction and eduction; manual work, DN, modalities as indicated    [employer] PT 08/08/2022, 8:49 AM

## 2022-08-09 ENCOUNTER — Encounter: Payer: Self-pay | Admitting: Physical Therapy

## 2022-08-09 ENCOUNTER — Ambulatory Visit: Payer: PPO | Admitting: Physical Therapy

## 2022-08-09 DIAGNOSIS — M26609 Unspecified temporomandibular joint disorder, unspecified side: Secondary | ICD-10-CM

## 2022-08-09 DIAGNOSIS — M5412 Radiculopathy, cervical region: Secondary | ICD-10-CM | POA: Diagnosis not present

## 2022-08-09 DIAGNOSIS — M6281 Muscle weakness (generalized): Secondary | ICD-10-CM

## 2022-08-09 DIAGNOSIS — M539 Dorsopathy, unspecified: Secondary | ICD-10-CM

## 2022-08-09 DIAGNOSIS — R29898 Other symptoms and signs involving the musculoskeletal system: Secondary | ICD-10-CM

## 2022-08-09 MED ORDER — METHOCARBAMOL 500 MG PO TABS: 500.0000 mg | ORAL_TABLET | Freq: Three times a day (TID) | ORAL | 0 refills | Status: DC

## 2022-08-09 NOTE — Therapy (Signed)
OUTPATIENT PHYSICAL THERAPY  TREATMENT AND RE-EVALUATION   Patient Name: Sheila Harris MRN: [id number] DOB:08-Nov-1955, 67 y.o., female Today's Date: 08/09/2022  END OF SESSION:  PT End of Session - 08/09/22 1011     Visit Number 4    Number of Visits 16    Date for PT Re-Evaluation 09/26/22    Authorization Type [employer] $15 copay; $3200 max    PT Start Time 0930    PT Stop Time 1015    PT Time Calculation (min) 45 min    Activity Tolerance Patient tolerated treatment well    Behavior During Therapy WFL for tasks assessed/performed              Past Medical History:  Diagnosis Date   Genital herpes    Macular degeneration    Thyroid disease    History reviewed. No pertinent surgical history. Patient Active Problem List   Diagnosis Date Noted   Metatarsalgia of right foot 03/06/2022   Iron deficiency 02/23/2022   Dry age-related macular degeneration 12/08/2021   Chronic cough 08/11/2021   Chronic foot pain, right 07/12/2021   Left shoulder pain 05/05/2021   Hormone replacement therapy (HRT) 07/08/2020   Corn of foot right, 4/5 interspace 06/07/2020   Recurrent low back pain 11/19/2019   Herpes infection 11/19/2019   Osteoporosis 11/19/2019   Factor V Leiden ([place]) 11/19/2019   Hypothyroidism 11/19/2019   Anxiety 11/19/2019   Trigger finger of right hand 06/07/2016   Carpal tunnel syndrome, right 02/22/2016   Primary osteoarthritis of right hand 12/30/2015   Right cervical radiculopathy 12/30/2015   Trigger finger of left hand 10/08/2015   Pes cavus 11/21/2013   Right lumbar radiculopathy 11/21/2013    PCP: Doran Heater  REFERRING PROVIDER: Dr Aundria Mems,  Dina Rich, DPM   REFERRING DIAG: Cervical Radiculopathy, Lumbar radiculopathy  THERAPY DIAG:  Cervical dysfunction  Other symptoms and signs involving the musculoskeletal system  TMJ dysfunction  Muscle weakness (generalized)  Rationale for Evaluation and  Treatment: Rehabilitation  ONSET DATE: 06/02/22  SUBJECTIVE:                                                                                                                                                                                                         SUBJECTIVE STATEMENT: Patient has been seeing podiatry for about 1 year. She had a shot for mortons neuroma which did not help. She also had a stress fracture of Rt 4th metatarsal and wore a boot and had physical therapy. She has also had back issues  for "decades" on and off and now MD and DPM believe foot pain may be coming from her back. 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 Pt states pain only "occasionally" goes down her leg, mostly pain is just in her Rt foot and toes and in her back. Pain in back and foot increases with prolonged standing and walking (20-30 minutes)  PERTINENT HISTORY:  neck pain on the off in the past 3-4 months but she as had pain in the neck over the past several years. She has had some pain in the Rt TMJ over the years as well. She has an overbite and jaw is tilted. She has seen a chiropractor in the past. She now has difficulty chewing and has not chewed in the past 2 weeks Chronic pain in the TMJ and neck; costochondritis; arthritis; osteoarthritis   PAIN:  Are you having pain? Yes: NPRS scale: 7/10 Pain location: Lt neck and jaw area; sometimes Rt  Pain description: sharp Aggravating factors: exercising with UE's overhead; sleeping on side   Relieving factors: massaging in the neck; heat; ice   PRECAUTIONS: None  WEIGHT BEARING RESTRICTIONS: No  FALLS:  Has patient fallen in last 6 months? No   OCCUPATION: retired - household chores; walking 30-60 min 6 days/wk   PATIENT GOALS: strengthen areas to help decrease pain   NEXT MD VISIT: None scheduled  OBJECTIVE:   DIAGNOSTIC FINDINGS:  Xray 05/10/22: No fracture, dislocation or subluxation. No  spondylolisthesis. No osteolytic or osteoblastic changes. Prevertebral and cervical cranial soft tissues are unremarkable. Degenerative disc disease noted with disc space narrowing and marginal osteophytes at C5-6 through C7-T1.  Lumbar MRI shows mild degenerative changes  PATIENT SURVEYS:  FOTO 47 goal 56  POSTURE:  Pt stands with Rt foot in supination Pt reports some symptom relief in foot with trunk extension  Patient presents with head forward posture with increased thoracic kyphosis; shoulders rounded and elevated; scapulae abducted and rotated along the thoracic spine; head of the humerus anterior in orientation.  PALPATION: Lumbar jt mobility WFL for CPAs and UPAs, no TTP lumbar or hip musculature  Muscular tightness ant/lat/posterior cervical musculature; TMD/jaw area Lt > Rt with tenderness into the Lt temple and scalp    CERVICAL ROM: tight end ranges   Active ROM A/PROM (deg) eval  Flexion 46  Extension 45  Right lateral flexion 27  Left lateral flexion 39  Right rotation 60  Left rotation 49   (Blank rows = not tested)   JAW: TMD opening and lateral deviation limited   LOWER EXTREMITY MMT:    MMT Right eval Left eval  Hip flexion 4+ 4+  Hip extension    Hip abduction 4+ 4+  Hip adduction    Hip internal rotation    Hip external rotation    Knee flexion    Knee extension    Ankle dorsiflexion 3   Ankle plantarflexion 3   Ankle inversion 3   Ankle eversion 3    (Blank rows = not tested)  LUMBAR ROM:   Active  A/PROM  eval  Flexion Limited 25%  Extension WFL  Right lateral flexion Limited 50%  Left lateral flexion Limited 50%  Right rotation Limited 50%  Left rotation Limited 50%   (Blank rows = not tested)  SLS Rt: 1 second  [facility] Adult PT Treatment:                                                DATE:  08/09/22 Therapeutic Exercise: Cobra for trunk ext x 3 in tolerable range for neck Heel raises x 10 Toe raises x 10 Toe yoga x 10 Seated Ankle eversion red TB x 10 Seated Ankle inversion red TB x 10    [facility] Adult PT Treatment:                                                 DATE: 08/08/22 Therapeutic Exercise: Chin tuck supine 10 sec x 5 Chin tuck with noodle 10 sec x 5 Scap squeeze with noodle 10 sec x 5 L's with noodle x 10  L's with noodle yellow TB 3 sec hold x 10 W's with noodle x 10  W's with noodle yellow TB 3 sec hold x 10 Supine scap squeeze 10 sec x 10  Row green TB 3 sec x 10  Bow and arrow green TB 3 sec x 10  Bow 45 deg green TB 3 sec x 10  Manual Therapy: STM with pt supine working through the post/lat/ant cervical musculature and TMD musculature  Trigger Point Dry-Needling  Treatment instructions: Expect mild to moderate muscle soreness. S/S of pneumothorax if dry needled over a lung field, and to seek immediate medical attention should they occur. Patient verbalized understanding of these instructions and education.  Patient Consent Given: Yes Education handout provided: Previously provided Muscles treated: bilat masseters; pterygoids; SCM; scaleni Electrical stimulation performed: No Parameters: N/A Treatment response/outcome: decreased palpable tightness   Neuromuscular re-ed: Initiated postural correction engaging posterior shoulder girdle musculature  Modalities: None  Self Care: Postural correction    DATE: 08/03/22 Therapeutic Exercise: Chin tuck supine 10 sec x 5 Chin tuck with noodle 10 sec x 5 Scap squeeze with noodle 10 sec x 5 L's with noodle x 10  L's with noodle yellow TB 3 sec hold x 10 W's with noodle x 10  W's with noodle yellow TB 3 sec hold x 10 Supine scap squeeze 10 sec x 10  Manual Therapy: STM with pt supine working through the post/lat/ant cervical musculature and TMD musculature  Trigger Point Dry-Needling  Treatment  instructions: Expect mild to moderate muscle soreness. S/S of pneumothorax if dry needled over a lung field, and to seek immediate medical attention should they occur. Patient verbalized understanding of these instructions and education.  Patient Consent Given: Yes Education handout provided: Previously provided Muscles treated: bilat SCM; scaleni Electrical stimulation performed: No Parameters: N/A Treatment response/outcome: decreased palpable tightness   Neuromuscular re-ed: Initiated postural correction engaging posterior shoulder girdle musculature  Modalities: None  Self Care: Postural correction    PATIENT EDUCATION:  Education details: POC; HEP  Person educated: Patient Education method: Consulting civil engineer, Demonstration, Tactile cues, Verbal cues, and Handouts Education comprehension: verbalized understanding, returned demonstration, verbal cues required, tactile cues required, and needs further education  HOME EXERCISE PROGRAM: Access Code: [email or web address] URL: [email or web address] Date: 08/09/2022 Prepared by: Isabelle Course  Exercises - Supine Cervical Retraction with Towel  - 2 x daily - 7 x weekly - 1 sets - 5-10 reps - 10 sec  hold - Standing Cervical Retraction  - 2 x daily - 7 x weekly - 1 sets - 10 reps - 5-10 sec  hold - Seated Scapular Retraction  - 2 x daily - 7 x weekly - 1-2 sets - 10 reps - 10 sec  hold - Shoulder External Rotation and Scapular Retraction  - 3 x daily - 7 x weekly - 1 sets - 10 reps - 3-5 sec   hold - Shoulder External Rotation in 45 Degrees Abduction  - 2 x daily - 7 x weekly - 1-2 sets - 10 reps - 3 sec  hold - Shoulder External Rotation and Scapular Retraction with Resistance  - 2 x daily - 7 x weekly - 1 sets - 10 reps - 3-5 sec  hold - Shoulder W - External Rotation with Resistance  - 2 x daily - 7 x weekly - 1-2 sets - 10 reps - 3 sec  hold - Supine Scapular Retraction  - 2 x daily - 7 x weekly - 1 sets - 10 reps - 5-10 sec   hold - Standing Bilateral Low Shoulder Row with Anchored Resistance  - 2 x daily - 7 x weekly - 1-3 sets - 10 reps - 2-3 sec  hold - Drawing Bow  - 1 x daily - 7 x weekly - 1 sets - 10 reps - 3 sec  hold - Standing High Row with Resistance  - 2 x daily - 7 x weekly - 1 sets - 10 reps - 3-5 sec  hold - Cobra  - 1 x daily - 7 x weekly - 1 sets - 10 reps - 10 seconds hold - Heel Raises with Counter Support  - 1 x daily - 7 x weekly - 3 sets - 10 reps - Heel Toe Raises with Counter Support  - 1 x daily - 7 x weekly - 3 sets - 10 reps - Seated Ankle Eversion with Resistance  - 1 x daily - 7 x weekly - 3 sets - 10 reps - Seated Ankle Inversion with Resistance  - 1 x daily - 7 x weekly - 3 sets - 10 reps - Toe Yoga - Alternating Great Toe and Lesser Toe Extension  - 1 x daily - 7 x weekly - 3 sets - 10 reps ASSESSMENT:  CLINICAL IMPRESSION: Pt with weakness in Rt foot and ankle, some relief with lumbar extension. She has decreased balance and activity tolerance and will benefit from skilled PT to address deficits and improving functional strength and mobility  OBJECTIVE IMPAIRMENTS: pain in the neck and jaw as well as into the shoulder area Lt > Rt. Patient presents with poor posture and alignment; limited cervical mobility and ROM; discomfort with TMD opening and lateral deviation; muscular tightness to palpation through the ant/lat/posterior cervical musculature and jaw areas; weakness in postural musculature of upper core; pain with functional activities including chewing.decreased mobility, decreased ROM, decreased strength, hypomobility, increased fascial restrictions, increased muscle spasms, impaired flexibility, improper body mechanics, postural dysfunction, and pain.    GOALS: Goals reviewed with patient? Yes  SHORT TERM GOALS: Target date: 08/28/2022  Independent in initial HEP  Baseline:  Goal status: INITIAL  2.  Instruct patient in improved posture and alignment with functional  activities and sitting/standing  Baseline:  Goal status: INITIAL   LONG TERM GOALS: Target date: 09/26/2022  Increase cervical mobility and ROM to WNL's throughout with decreased stiffness with ROM  Baseline:  Goal status: INITIAL  2.  Improve posture and alignment with patient to demonstrate improved upright posture with posterior shoulder girdle musculature engaged  Baseline:  Goal status: INITIAL  3.  Improve TMD opening with patient to report ability chew foods of normal textures  Baseline:  Goal status: INITIAL  4.  Decrease pain in neck and jaw by 50-75% allowing patient to exercise and perform normal functional activities  Baseline:  Goal status: INITIAL  5.  Independent in HEP including aquatic program as indicated  Baseline:  Goal status: INITIAL  6.  Improve functional limitation score to 56  Baseline:  Goal status: INITIAL  7.  Pt will improve Rt ankle strength to 4+/5 to improve standing and walking tolerance Baseline:  Goal status: INITIAL  8.  Pt will improve Rt SLS to >= 15 seconds to demo improved balance Baseline:  Goal status: INITIAL     PLAN:  PT FREQUENCY: 2x/week  PT DURATION: 8 weeks  PLANNED INTERVENTIONS: Therapeutic exercises, Therapeutic activity, Neuromuscular re-education, Patient/Family education, Self Care, Joint mobilization, Aquatic Therapy, Dry Needling, Electrical stimulation, Spinal mobilization, Cryotherapy, Moist heat, Taping, Traction, Ultrasound, Ionotophoresis 79m/ml Dexamethasone, Manual therapy, and Re-evaluation  PLAN FOR NEXT SESSION: ankle strength and balance, core strength. review and progress exercise; continue with postural correction and eduction; manual work, DN, modalities as indicated    Abbigal Radich, PT 08/09/2022, 12:13 PM

## 2022-08-09 NOTE — Addendum Note (Signed)
Addended by: Silverio Decamp on: 08/09/2022 09:14 AM   Modules accepted: Orders

## 2022-08-10 ENCOUNTER — Ambulatory Visit: Payer: PPO | Admitting: Rehabilitative and Restorative Service Providers"

## 2022-08-10 ENCOUNTER — Encounter: Payer: Self-pay | Admitting: Rehabilitative and Restorative Service Providers"

## 2022-08-10 DIAGNOSIS — M539 Dorsopathy, unspecified: Secondary | ICD-10-CM

## 2022-08-10 DIAGNOSIS — M5412 Radiculopathy, cervical region: Secondary | ICD-10-CM | POA: Diagnosis not present

## 2022-08-10 DIAGNOSIS — M6281 Muscle weakness (generalized): Secondary | ICD-10-CM

## 2022-08-10 DIAGNOSIS — R29898 Other symptoms and signs involving the musculoskeletal system: Secondary | ICD-10-CM

## 2022-08-10 DIAGNOSIS — M26609 Unspecified temporomandibular joint disorder, unspecified side: Secondary | ICD-10-CM

## 2022-08-10 NOTE — Therapy (Signed)
OUTPATIENT PHYSICAL THERAPY CERVICAL TREATMENT   Patient Name: Sheila Harris MRN: [id number] DOB:1956/05/26, 67 y.o., female Today's Date: 08/10/2022  END OF SESSION:  PT End of Session - 08/10/22 1151     Visit Number 5    Number of Visits 16    Date for PT Re-Evaluation 09/26/22    Authorization Type [employer] $15 copay; $3200 max    PT Start Time 1145    PT Stop Time 1234    PT Time Calculation (min) 49 min    Activity Tolerance Patient tolerated treatment well             Past Medical History:  Diagnosis Date   Genital herpes    Macular degeneration    Thyroid disease    History reviewed. No pertinent surgical history. Patient Active Problem List   Diagnosis Date Noted   Metatarsalgia of right foot 03/06/2022   Iron deficiency 02/23/2022   Dry age-related macular degeneration 12/08/2021   Chronic cough 08/11/2021   Chronic foot pain, right 07/12/2021   Left shoulder pain 05/05/2021   Hormone replacement therapy (HRT) 07/08/2020   Corn of foot right, 4/5 interspace 06/07/2020   Recurrent low back pain 11/19/2019   Herpes infection 11/19/2019   Osteoporosis 11/19/2019   Factor V Leiden ([place]) 11/19/2019   Hypothyroidism 11/19/2019   Anxiety 11/19/2019   Trigger finger of right hand 06/07/2016   Carpal tunnel syndrome, right 02/22/2016   Primary osteoarthritis of right hand 12/30/2015   Right cervical radiculopathy 12/30/2015   Trigger finger of left hand 10/08/2015   Pes cavus 11/21/2013   Right lumbar radiculopathy 11/21/2013    PCP: Doran Heater  REFERRING PROVIDER: Dr Aundria Mems   REFERRING DIAG: Cervical Radiculopathy  THERAPY DIAG:  Cervical dysfunction  Other symptoms and signs involving the musculoskeletal system  TMJ dysfunction  Muscle weakness (generalized)  Rationale for Evaluation and Treatment: Rehabilitation  ONSET DATE: 06/02/22  SUBJECTIVE:                                                                                                                                                                                                          SUBJECTIVE STATEMENT: Patient reports persistent tightness in the neck and jaw area. Difficulty sleeping - has increased pain when lying on either side. She tried a couple of different pillows and one worked pretty well and the other was not not as good. Still on liquid diets. Saw a different dentist yesterday and will see a specialist before further dental work/bridge for front lower teeth  PERTINENT HISTORY:  neck pain on the off in the past 3-4 months but she as had pain in the neck over the past several years. She has had some pain in the Rt TMJ over the years as well. She has an overbite and jaw is tilted. She has seen a chiropractor in the past. She now has difficulty chewing and has not chewed in the past 2 weeks Chronic pain in the TMJ and neck; costochondritis; arthritis; osteoarthritis   PAIN:  Are you having pain? Yes: NPRS scale: 6-7/10 Pain location: Lt neck and jaw area; sometimes Rt  Pain description: sharp Aggravating factors: exercising with UE's overhead; sleeping on side   Relieving factors: massaging in the neck; heat; ice   PRECAUTIONS: None  WEIGHT BEARING RESTRICTIONS: No  FALLS:  Has patient fallen in last 6 months? No  OCCUPATION: retired - household chores; walking 30-60 min 6 days/wk   PATIENT GOALS: strengthen areas to help decrease pain   NEXT MD VISIT: None scheduled  OBJECTIVE:   DIAGNOSTIC FINDINGS:  Xray 05/10/22: No fracture, dislocation or subluxation. No spondylolisthesis. No osteolytic or osteoblastic changes. Prevertebral and cervical cranial soft tissues are unremarkable. Degenerative disc disease noted with disc space narrowing and marginal osteophytes at C5-6 through C7-T1.  PATIENT SURVEYS:  FOTO 24 goal 80  POSTURE: Patient presents with head forward posture with increased thoracic kyphosis;  shoulders rounded and elevated; scapulae abducted and rotated along the thoracic spine; head of the humerus anterior in orientation.  PALPATION: Muscular tightness ant/lat/posterior cervical musculature; TMD/jaw area Lt > Rt with tenderness into the Lt temple and scalp    CERVICAL ROM: tight end ranges   Active ROM A/PROM (deg) eval  Flexion 46  Extension 45  Right lateral flexion 27  Left lateral flexion 39  Right rotation 60  Left rotation 49   (Blank rows = not tested)  UPPER EXTREMITY ROM: WFL's bilat UE's     JAW: TMD opening and lateral deviation limited   UPPER EXTREMITY MMT: WFL's bilat UE's                                                                                                                               [facility] Adult PT Treatment:                                                 DATE: 08/10/22 Therapeutic Exercise: Chin tuck supine 10 sec x 5 Chin tuck with noodle 10 sec x 5 Scap squeeze with noodle 10 sec x 5 L's with noodle x 10  L's with noodle yellow TB 3 sec hold x 10 W's with noodle x 10  W's with noodle yellow TB 3 sec hold x 10 Supine scap squeeze 10 sec x 10  Row green TB 3 sec x 10  Bow and arrow green TB 3 sec x 10  Bow 45 deg green TB 3 sec x 10  Manual Therapy: STM with pt supine working through the post/lat/ant cervical musculature and TMD musculature  Internal work through masseter/pterygoids Lt > Rt  Trigger Point Dry-Needling  Treatment instructions: Expect mild to moderate muscle soreness. S/S of pneumothorax if dry needled over a lung field, and to seek immediate medical attention should they occur. Patient verbalized understanding of these instructions and education.  Patient Consent Given: Yes Education handout provided: Previously provided Muscles treated: Lt suboccipitals; scaleni pt Rt sidelying  Electrical stimulation performed: No Parameters: N/A Treatment response/outcome: decreased palpable tightness   Neuromuscular  re-ed: Initiated postural correction engaging posterior shoulder girdle musculature  Modalities: None  Self Care: Postural correction   DATE: 08/08/22 Therapeutic Exercise: Chin tuck supine 10 sec x 5 Chin tuck with noodle 10 sec x 5 Scap squeeze with noodle 10 sec x 5 L's with noodle x 10  L's with noodle yellow TB 3 sec hold x 10 W's with noodle x 10  W's with noodle yellow TB 3 sec hold x 10 Supine scap squeeze 10 sec x 10  Row green TB 3 sec x 10  Bow and arrow green TB 3 sec x 10  Bow 45 deg green TB 3 sec x 10  Manual Therapy: STM with pt supine working through the post/lat/ant cervical musculature and TMD musculature  Trigger Point Dry-Needling  Treatment instructions: Expect mild to moderate muscle soreness. S/S of pneumothorax if dry needled over a lung field, and to seek immediate medical attention should they occur. Patient verbalized understanding of these instructions and education.  Patient Consent Given: Yes Education handout provided: Previously provided Muscles treated: bilat masseters; pterygoids; SCM; scaleni Electrical stimulation performed: No Parameters: N/A Treatment response/outcome: decreased palpable tightness   Neuromuscular re-ed: Initiated postural correction engaging posterior shoulder girdle musculature  Modalities: None  Self Care: Postural correction    PATIENT EDUCATION:  Education details: POC; HEP  Person educated: Patient Education method: Consulting civil engineer, Demonstration, Tactile cues, Verbal cues, and Handouts Education comprehension: verbalized understanding, returned demonstration, verbal cues required, tactile cues required, and needs further education  HOME EXERCISE PROGRAM: Access Code: [email or web address] URL: [email or web address] Date: 08/08/2022 Prepared by: Gillermo Murdoch  Exercises - Supine Cervical Retraction with Towel  - 2 x daily - 7 x weekly - 1 sets - 5-10 reps - 10 sec  hold - Standing Cervical Retraction  - 2 x  daily - 7 x weekly - 1 sets - 10 reps - 5-10 sec  hold - Seated Scapular Retraction  - 2 x daily - 7 x weekly - 1-2 sets - 10 reps - 10 sec  hold - Shoulder External Rotation and Scapular Retraction  - 3 x daily - 7 x weekly - 1 sets - 10 reps - 3-5 sec   hold - Shoulder External Rotation in 45 Degrees Abduction  - 2 x daily - 7 x weekly - 1-2 sets - 10 reps - 3 sec  hold - Shoulder External Rotation and Scapular Retraction with Resistance  - 2 x daily - 7 x weekly - 1 sets - 10 reps - 3-5 sec  hold - Shoulder W - External Rotation with Resistance  - 2 x daily - 7 x weekly - 1-2 sets - 10 reps - 3 sec  hold - Supine Scapular Retraction  - 2 x daily - 7 x weekly - 1 sets - 10 reps - 5-10  sec  hold - Standing Bilateral Low Shoulder Row with Anchored Resistance  - 2 x daily - 7 x weekly - 1-3 sets - 10 reps - 2-3 sec  hold - Drawing Bow  - 1 x daily - 7 x weekly - 1 sets - 10 reps - 3 sec  hold - Standing High Row with Resistance  - 2 x daily - 7 x weekly - 1 sets - 10 reps - 3-5 sec  hold ASSESSMENT:  CLINICAL IMPRESSION: Patient reports persistent pain in the jaw and neck area with variable intensity day to day. Reviewed and progressed exercises. Continued dry needling and manual work through the jaw and cervical musculature.   OBJECTIVE IMPAIRMENTS: pain in the neck and jaw as well as into the shoulder area Lt > Rt. Patient presents with poor posture and alignment; limited cervical mobility and ROM; discomfort with TMD opening and lateral deviation; muscular tightness to palpation through the ant/lat/posterior cervical musculature and jaw areas; weakness in postural musculature of upper core; pain with functional activities including chewing.decreased mobility, decreased ROM, decreased strength, hypomobility, increased fascial restrictions, increased muscle spasms, impaired flexibility, improper body mechanics, postural dysfunction, and pain.    GOALS: Goals reviewed with patient? Yes  SHORT  TERM GOALS: Target date: 08/28/2022  Independent in initial HEP  Baseline:  Goal status: INITIAL  2.  Instruct patient in improved posture and alignment with functional activities and sitting/standing  Baseline:  Goal status: INITIAL   LONG TERM GOALS: Target date: 09/26/2022  Increase cervical mobility and ROM to WNL's throughout with decreased stiffness with ROM  Baseline:  Goal status: INITIAL  2.  Improve posture and alignment with patient to demonstrate improved upright posture with posterior shoulder girdle musculature engaged  Baseline:  Goal status: INITIAL  3.  Improve TMD opening with patient to report ability chew foods of normal textures  Baseline:  Goal status: INITIAL  4.  Decrease pain in neck and jaw by 50-75% allowing patient to exercise and perform normal functional activities  Baseline:  Goal status: INITIAL  5.  Independent in HEP including aquatic program as indicated  Baseline:  Goal status: INITIAL  6.  Improve functional limitation score to 56  Baseline:  Goal status: INITIAL   PLAN:  PT FREQUENCY: 2x/week  PT DURATION: 8 weeks  PLANNED INTERVENTIONS: Therapeutic exercises, Therapeutic activity, Neuromuscular re-education, Patient/Family education, Self Care, Joint mobilization, Aquatic Therapy, Dry Needling, Electrical stimulation, Spinal mobilization, Cryotherapy, Moist heat, Taping, Traction, Ultrasound, Ionotophoresis 36m/ml Dexamethasone, Manual therapy, and Re-evaluation  PLAN FOR NEXT SESSION: review and progress exercise; continue with postural correction and eduction; manual work, DN, modalities as indicated    [employer] PT 08/10/2022, 11:52 AM

## 2022-08-11 ENCOUNTER — Encounter: Payer: Self-pay | Admitting: Sports Medicine

## 2022-08-15 ENCOUNTER — Encounter: Payer: PPO | Admitting: Physical Therapy

## 2022-08-15 ENCOUNTER — Encounter: Payer: PPO | Admitting: Rehabilitative and Restorative Service Providers"

## 2022-08-17 ENCOUNTER — Ambulatory Visit: Payer: PPO | Admitting: Rehabilitative and Restorative Service Providers"

## 2022-08-17 ENCOUNTER — Encounter: Payer: Self-pay | Admitting: Rehabilitative and Restorative Service Providers"

## 2022-08-17 ENCOUNTER — Ambulatory Visit (INDEPENDENT_AMBULATORY_CARE_PROVIDER_SITE_OTHER): Payer: PPO | Admitting: Family Medicine

## 2022-08-17 ENCOUNTER — Encounter: Payer: Self-pay | Admitting: Family Medicine

## 2022-08-17 VITALS — BP 134/75 | HR 100 | Wt 130.0 lb

## 2022-08-17 DIAGNOSIS — K0889 Other specified disorders of teeth and supporting structures: Secondary | ICD-10-CM

## 2022-08-17 DIAGNOSIS — M539 Dorsopathy, unspecified: Secondary | ICD-10-CM

## 2022-08-17 DIAGNOSIS — M26609 Unspecified temporomandibular joint disorder, unspecified side: Secondary | ICD-10-CM

## 2022-08-17 DIAGNOSIS — R519 Headache, unspecified: Secondary | ICD-10-CM | POA: Diagnosis not present

## 2022-08-17 DIAGNOSIS — M5412 Radiculopathy, cervical region: Secondary | ICD-10-CM | POA: Diagnosis not present

## 2022-08-17 DIAGNOSIS — M6281 Muscle weakness (generalized): Secondary | ICD-10-CM

## 2022-08-17 DIAGNOSIS — R29898 Other symptoms and signs involving the musculoskeletal system: Secondary | ICD-10-CM

## 2022-08-17 MED ORDER — RIZATRIPTAN BENZOATE 5 MG PO TABS: 5.0000 mg | ORAL_TABLET | ORAL | 0 refills | Status: DC | PRN

## 2022-08-17 NOTE — Therapy (Signed)
OUTPATIENT PHYSICAL THERAPY CERVICAL TREATMENT   Patient Name: Sheila Harris MRN: [id number] DOB:1955/11/17, 67 y.o., female Today's Date: 08/17/2022  END OF SESSION:  PT End of Session - 08/17/22 1101     Visit Number 6    Number of Visits 16    Date for PT Re-Evaluation 09/26/22    Authorization Type [employer] $15 copay; $3200 max    PT Start Time 1101    PT Stop Time 1225    PT Time Calculation (min) 84 min             Past Medical History:  Diagnosis Date   Genital herpes    Macular degeneration    Thyroid disease    History reviewed. No pertinent surgical history. Patient Active Problem List   Diagnosis Date Noted   Metatarsalgia of right foot 03/06/2022   Iron deficiency 02/23/2022   Dry age-related macular degeneration 12/08/2021   Chronic cough 08/11/2021   Chronic foot pain, right 07/12/2021   Left shoulder pain 05/05/2021   Hormone replacement therapy (HRT) 07/08/2020   Corn of foot right, 4/5 interspace 06/07/2020   Recurrent low back pain 11/19/2019   Herpes infection 11/19/2019   Osteoporosis 11/19/2019   Factor V Leiden ([place]) 11/19/2019   Hypothyroidism 11/19/2019   Anxiety 11/19/2019   Trigger finger of right hand 06/07/2016   Carpal tunnel syndrome, right 02/22/2016   Primary osteoarthritis of right hand 12/30/2015   Right cervical radiculopathy 12/30/2015   Trigger finger of left hand 10/08/2015   Pes cavus 11/21/2013   Right lumbar radiculopathy 11/21/2013    PCP: Doran Heater  REFERRING PROVIDER: Dr Aundria Mems   REFERRING DIAG: Cervical Radiculopathy  THERAPY DIAG:  Cervical dysfunction  Other symptoms and signs involving the musculoskeletal system  TMJ dysfunction  Muscle weakness (generalized)  Rationale for Evaluation and Treatment: Rehabilitation  ONSET DATE: 06/02/22  SUBJECTIVE:                                                                                                                                                                                                          SUBJECTIVE STATEMENT: Patient reports persistent tightness in the neck and jaw area. Difficulty sleeping - has increased pain when lying on either side. She tried a couple of different pillows and one worked pretty well and the other was not not as good. Still on liquid diets. Saw a different dental specialist who wants to reconstruct her teeth in entire her mouth. She has not decided what to do with the teeth. She will see  Dr Madilyn Fireman today b/c she is continuing to have headaches and jaw pain.  She had some flare up of the "nerve" Rt foot pain with heel lifts. She is now sitting down to do the heel raises. Has been rolling the Rt leg.  PERTINENT HISTORY:  neck pain on the off in the past 3-4 months but she as had pain in the neck over the past several years. She has had some pain in the Rt TMJ over the years as well. She has an overbite and jaw is tilted. She has seen a chiropractor in the past. She now has difficulty chewing and has not chewed in the past 2 weeks Chronic pain in the TMJ and neck; costochondritis; arthritis; osteoarthritis   PAIN:  Are you having pain? Yes: NPRS scale: 7/10 Pain location: Lt neck and jaw area; sometimes Rt  Yes: Rt foot 0/10 Pain description: sharp Aggravating factors: exercising with UE's overhead; sleeping on side   Relieving factors: massaging in the neck; heat; ice   PRECAUTIONS: None  WEIGHT BEARING RESTRICTIONS: No  FALLS:  Has patient fallen in last 6 months? No  OCCUPATION: retired - household chores; walking 30-60 min 6 days/wk   PATIENT GOALS: strengthen areas to help decrease pain   NEXT MD VISIT: None scheduled  OBJECTIVE:   DIAGNOSTIC FINDINGS:  Xray 05/10/22: No fracture, dislocation or subluxation. No spondylolisthesis. No osteolytic or osteoblastic changes. Prevertebral and cervical cranial soft tissues are unremarkable. Degenerative disc disease  noted with disc space narrowing and marginal osteophytes at C5-6 through C7-T1.  PATIENT SURVEYS:  FOTO 53 goal 69  POSTURE: Patient presents with head forward posture with increased thoracic kyphosis; shoulders rounded and elevated; scapulae abducted and rotated along the thoracic spine; head of the humerus anterior in orientation.  PALPATION: Muscular tightness ant/lat/posterior cervical musculature; TMD/jaw area Lt > Rt with tenderness into the Lt temple and scalp    CERVICAL ROM: tight end ranges   Active ROM A/PROM (deg) eval  Flexion 46  Extension 45  Right lateral flexion 27  Left lateral flexion 39  Right rotation 60  Left rotation 49   (Blank rows = not tested)  UPPER EXTREMITY ROM: WFL's bilat UE's     JAW: TMD opening and lateral deviation limited   UPPER EXTREMITY MMT: WFL's bilat UE's                                                                                                                               [facility] Adult PT Treatment:                                                 DATE: 08/17/22 Therapeutic Exercise: Chin tuck supine 10 sec x 5 Chin tuck with noodle 10 sec x 5 Scap squeeze with noodle 10 sec  x 5 L's with noodle x 10  L's with noodle red TB 3 sec hold x 10 W's with noodle red TB 3 sec hold x 10 Supine scap squeeze 10 sec x 10  Row green TB 3 sec x 10  Bow and arrow green TB 3 sec x 10  Bow 45 deg green TB 3 sec x 10  Antirotation green TB 1 strip 3 sec x 10 Rt/Lt  Slant board level 2 x 30 sec bilat  Seated heel raise x 10  Seated toe raise x 10  Hip IR seated ball btn knees, red TB ankles 3 x 10  Ankle eversion red TB x 10  Single leg stance 10 sec x 5 UE assist as needed  Cobra 2-3 sec x 10  Hip adductor stretch w/strap 30 x 3 Rt; x 2 Lt  Butterfly stretch 30 sec x 2  Manual Therapy: STM with pt supine working through the post/lat/ant cervical musculature and TMD musculature  Internal work through masseter/pterygoids Lt > Rt  Trigger  Point Dry-Needling  Reviewed precautions Patient Consent Given: Yes Education handout provided: Previously provided Muscles treated: Lt/Rt masseter; SCM; cervical paraspinals; scaleni; Lt scalp pt supine  Electrical stimulation performed: No Parameters: N/A Treatment response/outcome: decreased palpable tightness   Neuromuscular re-ed: Continued postural correction engaging posterior shoulder girdle musculature  Modalities: Moist heat cervical and thoracic spine x 10 min  Self Care: Postural correction   DATE: 08/10/22 Therapeutic Exercise: Chin tuck supine 10 sec x 5 Chin tuck with noodle 10 sec x 5 Scap squeeze with noodle 10 sec x 5 L's with noodle x 10  L's with noodle yellow TB 3 sec hold x 10 W's with noodle x 10  W's with noodle yellow TB 3 sec hold x 10 Supine scap squeeze 10 sec x 10  Row green TB 3 sec x 10  Bow and arrow green TB 3 sec x 10  Bow 45 deg green TB 3 sec x 10  Manual Therapy: STM with pt supine working through the post/lat/ant cervical musculature and TMD musculature  Internal work through masseter/pterygoids Lt > Rt  Trigger Point Dry-Needling  Treatment instructions: Expect mild to moderate muscle soreness. S/S of pneumothorax if dry needled over a lung field, and to seek immediate medical attention should they occur. Patient verbalized understanding of these instructions and education.  Patient Consent Given: Yes Education handout provided: Previously provided Muscles treated: Lt suboccipitals; scaleni pt Rt sidelying  Electrical stimulation performed: No Parameters: N/A Treatment response/outcome: decreased palpable tightness   Neuromuscular re-ed: Initiated postural correction engaging posterior shoulder girdle musculature  Modalities: None  Self Care: Postural correction    PATIENT EDUCATION:  Education details: POC; HEP  Person educated: Patient Education method: Consulting civil engineer, Demonstration, Tactile cues, Verbal cues, and  Handouts Education comprehension: verbalized understanding, returned demonstration, verbal cues required, tactile cues required, and needs further education  HOME EXERCISE PROGRAM: Access Code: [email or web address] URL: [email or web address] Date: 08/17/2022 Prepared by: Gillermo Murdoch  Exercises - Supine Cervical Retraction with Towel  - 2 x daily - 7 x weekly - 1 sets - 5-10 reps - 10 sec  hold - Standing Cervical Retraction  - 2 x daily - 7 x weekly - 1 sets - 10 reps - 5-10 sec  hold - Seated Scapular Retraction  - 2 x daily - 7 x weekly - 1-2 sets - 10 reps - 10 sec  hold - Shoulder External Rotation and Scapular Retraction  - 3 x daily -  7 x weekly - 1 sets - 10 reps - 3-5 sec   hold - Shoulder External Rotation in 45 Degrees Abduction  - 2 x daily - 7 x weekly - 1-2 sets - 10 reps - 3 sec  hold - Shoulder External Rotation and Scapular Retraction with Resistance  - 2 x daily - 7 x weekly - 1 sets - 10 reps - 3-5 sec  hold - Shoulder W - External Rotation with Resistance  - 2 x daily - 7 x weekly - 1-2 sets - 10 reps - 3 sec  hold - Supine Scapular Retraction  - 2 x daily - 7 x weekly - 1 sets - 10 reps - 5-10 sec  hold - Standing Bilateral Low Shoulder Row with Anchored Resistance  - 2 x daily - 7 x weekly - 1-3 sets - 10 reps - 2-3 sec  hold - Drawing Bow  - 1 x daily - 7 x weekly - 1 sets - 10 reps - 3 sec  hold - Standing High Row with Resistance  - 1 x daily - 7 x weekly - 1 sets - 10 reps - 3-5 sec  hold - Cobra  - 1 x daily - 7 x weekly - 1 sets - 10 reps - 10 seconds hold - Heel Raises with Counter Support  - 1 x daily - 7 x weekly - 3 sets - 10 reps - Heel Toe Raises with Counter Support  - 1 x daily - 7 x weekly - 3 sets - 10 reps - Seated Ankle Eversion with Resistance  - 1 x daily - 7 x weekly - 3 sets - 10 reps - Seated Ankle Inversion with Resistance  - 1 x daily - 7 x weekly - 3 sets - 10 reps - Toe Yoga - Alternating Great Toe and Lesser Toe Extension  - 1 x daily - 7 x  weekly - 3 sets - 10 reps - Anti-Rotation Lateral Stepping with Press  - 1 x daily - 7 x weekly - 1-2 sets - 10 reps - 2-3 sec  hold - Single Leg Stance  - 1 x daily - 7 x weekly - 2 sets - 5 reps - 10-20 sec hold - Seated Hip Internal Rotation with Ball and Resistance  - 2 x daily - 7 x weekly - 1 sets - 10 reps - 3 sec  hold ASSESSMENT:  CLINICAL IMPRESSION: Patient reports persistent pain in the jaw and neck area with variable intensity day to day. Reviewed and progressed exercises. Continued dry needling and manual work through the jaw and cervical musculature. Some flare up of Rt foot pain which patient feels is due to heel raises. No low back pain but radicular symptoms continue on an intermittent basis. Working on core stabilization and LE strengthening.  OBJECTIVE IMPAIRMENTS: pain in the neck and jaw as well as into the shoulder area Lt > Rt. Patient presents with poor posture and alignment; limited cervical mobility and ROM; discomfort with TMD opening and lateral deviation; muscular tightness to palpation through the ant/lat/posterior cervical musculature and jaw areas; weakness in postural musculature of upper core; pain with functional activities including chewing.decreased mobility, decreased ROM, decreased strength, hypomobility, increased fascial restrictions, increased muscle spasms, impaired flexibility, improper body mechanics, postural dysfunction, and pain.    GOALS: Goals reviewed with patient? Yes  SHORT TERM GOALS: Target date: 08/28/2022  Independent in initial HEP  Baseline:  Goal status: INITIAL  2.  Instruct patient in  improved posture and alignment with functional activities and sitting/standing  Baseline:  Goal status: INITIAL   LONG TERM GOALS: Target date: 09/26/2022  Increase cervical mobility and ROM to WNL's throughout with decreased stiffness with ROM  Baseline:  Goal status: INITIAL  2.  Improve posture and alignment with patient to demonstrate  improved upright posture with posterior shoulder girdle musculature engaged  Baseline:  Goal status: INITIAL  3.  Improve TMD opening with patient to report ability chew foods of normal textures  Baseline:  Goal status: INITIAL  4.  Decrease pain in neck and jaw by 50-75% allowing patient to exercise and perform normal functional activities  Baseline:  Goal status: INITIAL  5.  Independent in HEP including aquatic program as indicated  Baseline:  Goal status: INITIAL  6.  Improve functional limitation score to 56  Baseline:  Goal status: INITIAL   PLAN:  PT FREQUENCY: 2x/week  PT DURATION: 8 weeks  PLANNED INTERVENTIONS: Therapeutic exercises, Therapeutic activity, Neuromuscular re-education, Patient/Family education, Self Care, Joint mobilization, Aquatic Therapy, Dry Needling, Electrical stimulation, Spinal mobilization, Cryotherapy, Moist heat, Taping, Traction, Ultrasound, Ionotophoresis '4mg'$ /ml Dexamethasone, Manual therapy, and Re-evaluation  PLAN FOR NEXT SESSION: review and progress exercise; continue with postural correction and eduction; manual work, DN, modalities as indicated    [employer], PT 08/17/2022, 11:03 AM

## 2022-08-17 NOTE — Progress Notes (Signed)
Established Patient Office Visit  Subjective   Patient ID: Sheila Harris, female    DOB: 1956/06/16  Age: 67 y.o. MRN: [id number]  Chief Complaint  Patient presents with   Headache    HPI  C/O of left sided HAs for years. Lately more intense and more frequent over the last couple of months.  The pain has been almost daily.. She is post meno.  She has been getting dry needling for her head and neck.  A couple of times a week and that does give her relief for about 2 days but then it comes right back it is mostly focused around her left ear jaw and posterior to the ear.  She does occasionally use ibuprofen and sometimes Tylenol.  Last night she also took a muscle relaxer and did get some relief.  She does occasionally get nauseated because of the pain with the headaches.  But no light sensitivity or sound sensitivity.    ROS    Objective:     BP 134/75   Pulse 100   Wt 130 lb (59 kg)   LMP 04/03/2010   SpO2 98%   BMI 20.98 kg/m    Physical Exam Vitals and nursing note reviewed.  Constitutional:      Appearance: She is well-developed.  HENT:     Head: Normocephalic and atraumatic.     Left Ear: Tympanic membrane, ear canal and external ear normal.     Mouth/Throat:     Pharynx: Oropharynx is clear.  Eyes:     Conjunctiva/sclera: Conjunctivae normal.  Pulmonary:     Effort: Pulmonary effort is normal.  Musculoskeletal:     Cervical back: Neck supple. No tenderness.  Skin:    General: Skin is warm and dry.  Neurological:     Mental Status: She is alert and oriented to person, place, and time.  Psychiatric:        Behavior: Behavior normal.     No results found for any visits on 08/17/22.    The 10-year ASCVD risk score (Arnett DK, et al., 2019) is: 6.7%    Assessment & Plan:   Problem List Items Addressed This Visit   None Visit Diagnoses     Left-sided headache    -  Primary   Relevant Medications   rizatriptan (MAXALT) 5 MG tablet   Pain, dental            Left sided headache -really ramped up over the last couple of months.  She is postmenopausal.  It certainly could be cervical related could be an occipital nerve.  She is also concerned about TMJ and her jaw since it is focused around that ear as well that is certainly a possibility but these seem pretty significant headaches more than what I would expect with typical TMJ.  She is having some dental issues on her lower jaw that she is addressing as well.  We discussed maybe doing a trial of a triptan just to see even though not convinced that these are migraine headaches.  If the triptans helpful then we can stick with that.  We also discussed maybe doing amitriptyline or nortriptyline at bedtime for chronic headaches as well as occipital nerve issues to see if that could be helpful as well we could certainly consider it moving forward.  I also recommend MRI of the brain and cervical spine for further workup.  Dental Pain -still trying to work with her dentist to have her bridge redone.  No follow-ups on file.    [name], MD

## 2022-08-17 NOTE — Patient Instructions (Addendum)
Next step would be to consider an MRI of the brain and neck. If you feel ok with this, please let me know I am happy to place the order.  We can do it here at our location or if it needs to be done at a different location because of insurance preferences then please let us know.

## 2022-08-22 ENCOUNTER — Ambulatory Visit: Payer: PPO | Attending: Sports Medicine | Admitting: Physical Therapy

## 2022-08-22 ENCOUNTER — Encounter: Payer: Self-pay | Admitting: Physical Therapy

## 2022-08-22 ENCOUNTER — Ambulatory Visit: Payer: PPO | Admitting: Rehabilitative and Restorative Service Providers"

## 2022-08-22 DIAGNOSIS — M26609 Unspecified temporomandibular joint disorder, unspecified side: Secondary | ICD-10-CM

## 2022-08-22 DIAGNOSIS — M6281 Muscle weakness (generalized): Secondary | ICD-10-CM | POA: Insufficient documentation

## 2022-08-22 DIAGNOSIS — R29898 Other symptoms and signs involving the musculoskeletal system: Secondary | ICD-10-CM | POA: Diagnosis present

## 2022-08-22 DIAGNOSIS — M539 Dorsopathy, unspecified: Secondary | ICD-10-CM | POA: Diagnosis present

## 2022-08-22 NOTE — Therapy (Addendum)
OUTPATIENT PHYSICAL THERAPY  TREATMENT   Patient Name: Sheila Harris MRN: [id number] DOB:06-13-1956, 67 y.o., female Today's Date: 08/22/2022  END OF SESSION:  PT End of Session - 08/22/22 1109     Visit Number 7    Number of Visits 16    Date for PT Re-Evaluation 09/26/22    Authorization Type [employer] $15 copay; $3200 max    PT Start Time 1105    PT Stop Time 1145    PT Time Calculation (min) 40 min    Activity Tolerance Patient tolerated treatment well    Behavior During Therapy WFL for tasks assessed/performed              Past Medical History:  Diagnosis Date   Genital herpes    Macular degeneration    Thyroid disease    History reviewed. No pertinent surgical history. Patient Active Problem List   Diagnosis Date Noted   Metatarsalgia of right foot 03/06/2022   Iron deficiency 02/23/2022   Dry age-related macular degeneration 12/08/2021   Chronic cough 08/11/2021   Chronic foot pain, right 07/12/2021   Left shoulder pain 05/05/2021   Hormone replacement therapy (HRT) 07/08/2020   Corn of foot right, 4/5 interspace 06/07/2020   Recurrent low back pain 11/19/2019   Herpes infection 11/19/2019   Osteoporosis 11/19/2019   Factor V Leiden ([place]) 11/19/2019   Hypothyroidism 11/19/2019   Anxiety 11/19/2019   Trigger finger of right hand 06/07/2016   Carpal tunnel syndrome, right 02/22/2016   Primary osteoarthritis of right hand 12/30/2015   Right cervical radiculopathy 12/30/2015   Trigger finger of left hand 10/08/2015   Pes cavus 11/21/2013   Right lumbar radiculopathy 11/21/2013    PCP: Doran Heater  REFERRING PROVIDER: Dr Aundria Mems,  Dina Rich, DPM   REFERRING DIAG: Cervical Radiculopathy, Lumbar radiculopathy  THERAPY DIAG:  Other symptoms and signs involving the musculoskeletal system  Muscle weakness (generalized)  Rationale for Evaluation and Treatment: Rehabilitation  ONSET DATE: 06/02/22  SUBJECTIVE:                                                                                                                                                                                                          SUBJECTIVE STATEMENT: Pt states she had some pain down her Rt leg after walking up a steep hill this weekend. She thinks her foot pain is coming from her back. She wants to improve core and hip strength as well as ankle strength.  Patient reports that she has had some increased pain. She  saw Dr Madilyn Fireman this week for headaches. Dr added Topamax for headaches. Stress seems to bring headache on. Rever has not decided on how to proceed with dental work.   PERTINENT HISTORY:  neck pain on the off in the past 3-4 months but she as had pain in the neck over the past several years. She has had some pain in the Rt TMJ over the years as well. She has an overbite and jaw is tilted. She has seen a chiropractor in the past. She now has difficulty chewing and has not chewed in the past 2 weeks Chronic pain in the TMJ and neck; costochondritis; arthritis; osteoarthritis   Patient has been seeing podiatry for about 1 year. She had a shot for mortons neuroma which did not help. She also had a stress fracture of Rt 4th metatarsal and wore a boot and had physical therapy. She has also had back issues for "decades" on and off and now MD and DPM believe foot pain may be coming from her back. 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 Pt states pain only "occasionally" goes down her leg, mostly pain is just in her Rt foot and toes and in her back. Pain in back and foot increases with prolonged standing and walking (20-30 minutes)  PAIN:  Are you having pain? Yes: NPRS scale: 3/10 Pain location: Lt neck and jaw area; sometimes Rt  Pain description: sharp Aggravating factors: exercising with UE's overhead; sleeping on side   Relieving factors: massaging in the neck; heat; ice    PRECAUTIONS: None  WEIGHT BEARING RESTRICTIONS: No  FALLS:  Has patient fallen in last 6 months? No   OCCUPATION: retired - household chores; walking 30-60 min 6 days/wk   PATIENT GOALS: strengthen areas to help decrease pain   NEXT MD VISIT: None scheduled  OBJECTIVE:   DIAGNOSTIC FINDINGS:  Xray 05/10/22: No fracture, dislocation or subluxation. No spondylolisthesis. No osteolytic or osteoblastic changes. Prevertebral and cervical cranial soft tissues are unremarkable. Degenerative disc disease noted with disc space narrowing and marginal osteophytes at C5-6 through C7-T1.  Lumbar MRI shows mild degenerative changes  PATIENT SURVEYS:  FOTO 47 goal 56  POSTURE:  Pt stands with Rt foot in supination Pt reports some symptom relief in foot with trunk extension  Patient presents with head forward posture with increased thoracic kyphosis; shoulders rounded and elevated; scapulae abducted and rotated along the thoracic spine; head of the humerus anterior in orientation.  PALPATION: Lumbar jt mobility WFL for CPAs and UPAs, no TTP lumbar or hip musculature  Muscular tightness ant/lat/posterior cervical musculature; TMD/jaw area Lt > Rt with tenderness into the Lt temple and scalp    CERVICAL ROM: tight end ranges   Active ROM A/PROM (deg) eval  Flexion 46  Extension 45  Right lateral flexion 27  Left lateral flexion 39  Right rotation 60  Left rotation 49   (Blank rows = not tested)   JAW: TMD opening and lateral deviation limited   LOWER EXTREMITY MMT:    MMT Right eval Left eval  Hip flexion 4+ 4+  Hip extension    Hip abduction 4+ 4+  Hip adduction    Hip internal rotation    Hip external rotation    Knee flexion    Knee extension    Ankle dorsiflexion 3   Ankle plantarflexion 3   Ankle inversion 3   Ankle eversion 3    (Blank rows = not  tested)  LUMBAR ROM:   Active  A/PROM  eval  Flexion Limited 25%  Extension WFL  Right lateral flexion  Limited 50%  Left lateral flexion Limited 50%  Right rotation Limited 50%  Left rotation Limited 50%   (Blank rows = not tested)  SLS Rt: 1 second                                                                                                                             [facility] Adult PT Treatment:                                                DATE: 08/22/22 Therapeutic Exercise: Nustep L7 x 5 min for warm up Cobra 10 x 10 sec Prone hip ext x 10 bilat with cues for core contraction prior to lift Sidelying hip abd x 10 Sidelying hip arcs with heel/toe tap x 10 Reverse clam with ball between knees x 15 Clam with ball between ankles x 15 Heel raises x 10 Toe raises x 10 SLS 2 x 30 sec bilat Marching red TB x 10 bilat Headache/TMJ Doorway stretch 3 positions 30 sec x 2  Scap squeeze with noodle 5 sec x 10  L's with yellow TB x 20  W's yellow TB x 20 Wall angel x 20  Manual:  Soft tissue mobilization to assess response to manual work and DN  Trigger Point Dry-Needling  Treatment instructions: Expect mild to moderate muscle soreness. S/S of pneumothorax if dry needled over a lung field, and to seek immediate medical attention should they occur. Patient verbalized understanding of these instructions and education.  Patient Consent Given: Yes Education handout provided: Previously provided Muscles treated: suboccipitals; cervical paraspinals; SCM at posterior mandible Electrical stimulation performed: No Parameters: N/A Treatment response/outcome: decreased palpable tightness noted following treatment  Modalities Moist heat cervical and thoracic area x 10 min    [facility] Adult PT Treatment:                                                DATE: 08/09/22 Therapeutic Exercise: Cobra for trunk ext x 3 in tolerable range for neck Heel raises x 10 Toe raises x 10 Toe yoga x 10 Seated Ankle eversion red TB x 10 Seated Ankle inversion red TB x 10   PATIENT EDUCATION:  Education details:  POC; HEP  Person educated: Patient Education method: Consulting civil engineer, Demonstration, Tactile cues, Verbal cues, and Handouts Education comprehension: verbalized understanding, returned demonstration, verbal cues required, tactile cues required, and needs further education  HOME EXERCISE PROGRAM: Access Code: [email or web address] URL: [email or web address] Date: 08/09/2022 Prepared by: Isabelle Course  Exercises - Supine Cervical Retraction with Towel  -  2 x daily - 7 x weekly - 1 sets - 5-10 reps - 10 sec  hold - Standing Cervical Retraction  - 2 x daily - 7 x weekly - 1 sets - 10 reps - 5-10 sec  hold - Seated Scapular Retraction  - 2 x daily - 7 x weekly - 1-2 sets - 10 reps - 10 sec  hold - Shoulder External Rotation and Scapular Retraction  - 3 x daily - 7 x weekly - 1 sets - 10 reps - 3-5 sec   hold - Shoulder External Rotation in 45 Degrees Abduction  - 2 x daily - 7 x weekly - 1-2 sets - 10 reps - 3 sec  hold - Shoulder External Rotation and Scapular Retraction with Resistance  - 2 x daily - 7 x weekly - 1 sets - 10 reps - 3-5 sec  hold - Shoulder W - External Rotation with Resistance  - 2 x daily - 7 x weekly - 1-2 sets - 10 reps - 3 sec  hold - Supine Scapular Retraction  - 2 x daily - 7 x weekly - 1 sets - 10 reps - 5-10 sec  hold - Standing Bilateral Low Shoulder Row with Anchored Resistance  - 2 x daily - 7 x weekly - 1-3 sets - 10 reps - 2-3 sec  hold - Drawing Bow  - 1 x daily - 7 x weekly - 1 sets - 10 reps - 3 sec  hold - Standing High Row with Resistance  - 2 x daily - 7 x weekly - 1 sets - 10 reps - 3-5 sec  hold - Cobra  - 1 x daily - 7 x weekly - 1 sets - 10 reps - 10 seconds hold - Heel Raises with Counter Support  - 1 x daily - 7 x weekly - 3 sets - 10 reps - Heel Toe Raises with Counter Support  - 1 x daily - 7 x weekly - 3 sets - 10 reps - Seated Ankle Eversion with Resistance  - 1 x daily - 7 x weekly - 3 sets - 10 reps - Seated Ankle Inversion with Resistance  - 1 x  daily - 7 x weekly - 3 sets - 10 reps - Toe Yoga - Alternating Great Toe and Lesser Toe Extension  - 1 x daily - 7 x weekly - 3 sets - 10 reps ASSESSMENT:  CLINICAL IMPRESSION: Session focused on extensions, core and hip strength to reduce pain down to Rt foot. Pt with good tolerance to exercises during session. Continued with postural correction, stretching pecs, strengthening for posterior shoulder girdle; DN/manual work through the suboccipitals and posterior cervical musculature. Will assess response to DN and determine need for further treatment to address headaches and TMJ dysfunction.   OBJECTIVE IMPAIRMENTS: pain in the neck and jaw as well as into the shoulder area Lt > Rt. Patient presents with poor posture and alignment; limited cervical mobility and ROM; discomfort with TMD opening and lateral deviation; muscular tightness to palpation through the ant/lat/posterior cervical musculature and jaw areas; weakness in postural musculature of upper core; pain with functional activities including chewing.decreased mobility, decreased ROM, decreased strength, hypomobility, increased fascial restrictions, increased muscle spasms, impaired flexibility, improper body mechanics, postural dysfunction, and pain.    GOALS: Goals reviewed with patient? Yes  SHORT TERM GOALS: Target date: 08/28/2022  Independent in initial HEP  Baseline:  Goal status: INITIAL  2.  Instruct patient in improved posture and alignment  with functional activities and sitting/standing  Baseline:  Goal status: INITIAL   LONG TERM GOALS: Target date: 09/26/2022  Increase cervical mobility and ROM to WNL's throughout with decreased stiffness with ROM  Baseline:  Goal status: INITIAL  2.  Improve posture and alignment with patient to demonstrate improved upright posture with posterior shoulder girdle musculature engaged  Baseline:  Goal status: INITIAL  3.  Improve TMD opening with patient to report ability chew foods  of normal textures  Baseline:  Goal status: INITIAL  4.  Decrease pain in neck and jaw by 50-75% allowing patient to exercise and perform normal functional activities  Baseline:  Goal status: INITIAL  5.  Independent in HEP including aquatic program as indicated  Baseline:  Goal status: INITIAL  6.  Improve functional limitation score to 56  Baseline:  Goal status: INITIAL  7.  Pt will improve Rt ankle strength to 4+/5 to improve standing and walking tolerance Baseline:  Goal status: INITIAL  8.  Pt will improve Rt SLS to >= 15 seconds to demo improved balance Baseline:  Goal status: INITIAL     PLAN:  PT FREQUENCY: 2x/week  PT DURATION: 8 weeks  PLANNED INTERVENTIONS: Therapeutic exercises, Therapeutic activity, Neuromuscular re-education, Patient/Family education, Self Care, Joint mobilization, Aquatic Therapy, Dry Needling, Electrical stimulation, Spinal mobilization, Cryotherapy, Moist heat, Taping, Traction, Ultrasound, Ionotophoresis '4mg'$ /ml Dexamethasone, Manual therapy, and Re-evaluation  PLAN FOR NEXT SESSION: ankle and hip strength and balance, core strength. review and progress exercise; continue with postural correction and eduction; manual work, DN, modalities as indicated    Waver Dibiasio, PT 08/22/2022, 11:42 AM  Celyn P. Helene Kelp PT, MPH 08/22/22 12:25 PM

## 2022-08-22 NOTE — Therapy (Signed)
Patient was seen for treatment. See Isabelle Course, PT note for treatment details and charges.   Coden Franchi P. Helene Kelp PT, MPH 08/22/22 12:32 PM

## 2022-08-23 ENCOUNTER — Encounter: Payer: Self-pay | Admitting: Family Medicine

## 2022-08-24 ENCOUNTER — Ambulatory Visit: Payer: PPO | Admitting: Rehabilitative and Restorative Service Providers"

## 2022-08-24 ENCOUNTER — Encounter: Payer: Self-pay | Admitting: Rehabilitative and Restorative Service Providers"

## 2022-08-24 ENCOUNTER — Other Ambulatory Visit: Payer: Self-pay | Admitting: Family Medicine

## 2022-08-24 DIAGNOSIS — F419 Anxiety disorder, unspecified: Secondary | ICD-10-CM

## 2022-08-24 DIAGNOSIS — R29898 Other symptoms and signs involving the musculoskeletal system: Secondary | ICD-10-CM | POA: Diagnosis not present

## 2022-08-24 DIAGNOSIS — M539 Dorsopathy, unspecified: Secondary | ICD-10-CM

## 2022-08-24 DIAGNOSIS — M26609 Unspecified temporomandibular joint disorder, unspecified side: Secondary | ICD-10-CM

## 2022-08-24 DIAGNOSIS — M6281 Muscle weakness (generalized): Secondary | ICD-10-CM

## 2022-08-24 NOTE — Therapy (Signed)
OUTPATIENT PHYSICAL THERAPY  TREATMENT   Patient Name: SHAQUNNA LINGAD MRN: [id number] DOB:03/03/1956, 67 y.o., female Today's Date: 08/24/2022  END OF SESSION:  PT End of Session - 08/24/22 1109     Visit Number 8    Number of Visits 16    Date for PT Re-Evaluation 09/26/22    Authorization Type [employer] $15 copay; $3200 max    PT Start Time 1108    PT Stop Time 1215    PT Time Calculation (min) 67 min    Activity Tolerance Patient tolerated treatment well              Past Medical History:  Diagnosis Date   Genital herpes    Macular degeneration    Thyroid disease    History reviewed. No pertinent surgical history. Patient Active Problem List   Diagnosis Date Noted   Metatarsalgia of right foot 03/06/2022   Iron deficiency 02/23/2022   Dry age-related macular degeneration 12/08/2021   Chronic cough 08/11/2021   Chronic foot pain, right 07/12/2021   Left shoulder pain 05/05/2021   Hormone replacement therapy (HRT) 07/08/2020   Corn of foot right, 4/5 interspace 06/07/2020   Recurrent low back pain 11/19/2019   Herpes infection 11/19/2019   Osteoporosis 11/19/2019   Factor V Leiden ([place]) 11/19/2019   Hypothyroidism 11/19/2019   Anxiety 11/19/2019   Trigger finger of right hand 06/07/2016   Carpal tunnel syndrome, right 02/22/2016   Primary osteoarthritis of right hand 12/30/2015   Right cervical radiculopathy 12/30/2015   Trigger finger of left hand 10/08/2015   Pes cavus 11/21/2013   Right lumbar radiculopathy 11/21/2013    PCP: Doran Heater  REFERRING PROVIDER: Dr Aundria Mems,  Dina Rich, DPM   REFERRING DIAG: Cervical Radiculopathy, Lumbar radiculopathy  THERAPY DIAG:  Other symptoms and signs involving the musculoskeletal system  Muscle weakness (generalized)  Cervical dysfunction  TMJ dysfunction  Rationale for Evaluation and Treatment: Rehabilitation  ONSET DATE: 06/02/22  SUBJECTIVE:                                                                                                                                                                                                          SUBJECTIVE STATEMENT: Pt states she walked the dog for about a mile this morning. She thinks her foot pain is coming from her back. She wants to improve core and hip strength as well as ankle strength.  Patient reports that she has had some increased pain.  Topamax prescribed for headaches but she is not taking it  because it does not make her feel very good. Stress seems to increase headache. Krina has not decided on how to proceed with dental work.   PERTINENT HISTORY:  neck pain on the off in the past 3-4 months but she as had pain in the neck over the past several years. She has had some pain in the Rt TMJ over the years as well. She has an overbite and jaw is tilted. She has seen a chiropractor in the past. She now has difficulty chewing and has not chewed in the past 2 weeks Chronic pain in the TMJ and neck; costochondritis; arthritis; osteoarthritis   Patient has been seeing podiatry for about 1 year. She had a shot for mortons neuroma which did not help. She also had a stress fracture of Rt 4th metatarsal and wore a boot and had physical therapy. She has also had back issues for "decades" on and off and now MD and DPM believe foot pain may be coming from her back. 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 Pt states pain only "occasionally" goes down her leg, mostly pain is just in her Rt foot and toes and in her back. Pain in back and foot increases with prolonged standing and walking (20-30 minutes)  PAIN:  Are you having pain? Yes: NPRS scale: 4/10 Pain location: Lt neck and jaw area; sometimes Rt  Pain description: sharp Aggravating factors: exercising with UE's overhead; sleeping on side   Relieving factors: massaging in the neck; heat; ice   PRECAUTIONS: None  WEIGHT  BEARING RESTRICTIONS: No  FALLS:  Has patient fallen in last 6 months? No   OCCUPATION: retired - household chores; walking 30-60 min 6 days/wk   PATIENT GOALS: strengthen areas to help decrease pain   NEXT MD VISIT: None scheduled  OBJECTIVE:   DIAGNOSTIC FINDINGS:  Xray 05/10/22: No fracture, dislocation or subluxation. No spondylolisthesis. No osteolytic or osteoblastic changes. Prevertebral and cervical cranial soft tissues are unremarkable. Degenerative disc disease noted with disc space narrowing and marginal osteophytes at C5-6 through C7-T1.  Lumbar MRI shows mild degenerative changes  PATIENT SURVEYS:  FOTO 47 goal 56  POSTURE:  Pt stands with Rt foot in supination Pt reports some symptom relief in foot with trunk extension  Patient presents with head forward posture with increased thoracic kyphosis; shoulders rounded and elevated; scapulae abducted and rotated along the thoracic spine; head of the humerus anterior in orientation.  PALPATION: Lumbar jt mobility WFL for CPAs and UPAs, no TTP lumbar or hip musculature  Muscular tightness ant/lat/posterior cervical musculature; TMD/jaw area Lt > Rt with tenderness into the Lt temple and scalp    CERVICAL ROM: tight end ranges   Active ROM A/PROM (deg) eval  Flexion 46  Extension 45  Right lateral flexion 27  Left lateral flexion 39  Right rotation 60  Left rotation 49   (Blank rows = not tested)   JAW: TMD opening and lateral deviation limited   LOWER EXTREMITY MMT:    MMT Right eval Left eval  Hip flexion 4+ 4+  Hip extension    Hip abduction 4+ 4+  Hip adduction    Hip internal rotation    Hip external rotation    Knee flexion    Knee extension    Ankle dorsiflexion 3   Ankle plantarflexion 3   Ankle inversion 3   Ankle eversion 3    (Blank rows = not tested)  LUMBAR ROM:  Active  A/PROM  eval  Flexion Limited 25%  Extension WFL  Right lateral flexion Limited 50%  Left lateral flexion  Limited 50%  Right rotation Limited 50%  Left rotation Limited 50%   (Blank rows = not tested)  SLS Rt: 1 second                                                                                                                             [facility] Adult PT Treatment:                                                 DATE: 08/24/22 Therapeutic Exercise: Nustep L7 x 10 min for warm up Slant board calf stretch 30 sec x 2  Step up forward 6 in 10 x 2 Rt/Lt   Hip extension green TB ankles x 20 Rt/Lt Hip abduction green TB ankles x 20 Rt/Lt Mini squat UE support as needed x 10 x 2 Side steps green TB ankles 10 ft x 5 Rt/Lt  Heel raises x 10 x 2 Toe raises x 10 x 2 SLS 2 x 30 sec bilat Antirotation green 10 Rt/Lt  Headache/TMJ Doorway stretch 3 positions 30 sec x 2  Scap squeeze with noodle 5 sec x 10  L's with yellow TB x 20  W's yellow TB x 20 Wall angel x 20  Manual:  Soft tissue mobilization; deep tissue work through the ant/lat/post cervical musculature; pecs; upper trap; scalp  Modalities Moist heat cervical and thoracic area x 10 min   DATE: 08/22/22 Therapeutic Exercise: Nustep L7 x 5 min for warm up Cobra 10 x 10 sec Prone hip ext x 10 bilat with cues for core contraction prior to lift Sidelying hip abd x 10 Sidelying hip arcs with heel/toe tap x 10 Reverse clam with ball between knees x 15 Clam with ball between ankles x 15 Heel raises x 10 Toe raises x 10 SLS 2 x 30 sec bilat Marching red TB x 10 bilat Headache/TMJ Doorway stretch 3 positions 30 sec x 2  Scap squeeze with noodle 5 sec x 10  L's with yellow TB x 20  W's yellow TB x 20 Wall angel x 20  Manual:  Soft tissue mobilization to assess response to manual work and DN  Trigger Point Dry-Needling  Treatment instructions: Expect mild to moderate muscle soreness. S/S of pneumothorax if dry needled over a lung field, and to seek immediate medical attention should they occur. Patient verbalized understanding of these  instructions and education.  Patient Consent Given: Yes Education handout provided: Previously provided Muscles treated: suboccipitals; cervical paraspinals; SCM at posterior mandible Electrical stimulation performed: No Parameters: N/A Treatment response/outcome: decreased palpable tightness noted following treatment  Modalities Moist heat cervical and thoracic area x 10 min    PATIENT EDUCATION:  Education details: POC; HEP  Person educated: Patient Education method: Explanation, Demonstration, Tactile cues, Verbal cues, and Handouts Education comprehension: verbalized understanding, returned demonstration, verbal cues required, tactile cues required, and needs further education  HOME EXERCISE PROGRAM: Access Code: [email or web address] URL: [email or web address] Date: 08/24/2022 Prepared by: Gillermo Murdoch  Exercises - Supine Cervical Retraction with Towel  - 2 x daily - 7 x weekly - 1 sets - 5-10 reps - 10 sec  hold - Standing Cervical Retraction  - 2 x daily - 7 x weekly - 1 sets - 10 reps - 5-10 sec  hold - Seated Scapular Retraction  - 2 x daily - 7 x weekly - 1-2 sets - 10 reps - 10 sec  hold - Shoulder External Rotation and Scapular Retraction  - 3 x daily - 7 x weekly - 1 sets - 10 reps - 3-5 sec   hold - Shoulder External Rotation in 45 Degrees Abduction  - 2 x daily - 7 x weekly - 1-2 sets - 10 reps - 3 sec  hold - Shoulder External Rotation and Scapular Retraction with Resistance  - 2 x daily - 7 x weekly - 1 sets - 10 reps - 3-5 sec  hold - Shoulder W - External Rotation with Resistance  - 2 x daily - 7 x weekly - 1-2 sets - 10 reps - 3 sec  hold - Supine Scapular Retraction  - 2 x daily - 7 x weekly - 1 sets - 10 reps - 5-10 sec  hold - Standing Bilateral Low Shoulder Row with Anchored Resistance  - 2 x daily - 7 x weekly - 1-3 sets - 10 reps - 2-3 sec  hold - Drawing Bow  - 1 x daily - 7 x weekly - 1 sets - 10 reps - 3 sec  hold - Standing High Row with Resistance  - 1  x daily - 7 x weekly - 1 sets - 10 reps - 3-5 sec  hold - Cobra  - 1 x daily - 7 x weekly - 1 sets - 10 reps - 10 seconds hold - Heel Raises with Counter Support  - 1 x daily - 7 x weekly - 3 sets - 10 reps - Heel Toe Raises with Counter Support  - 1 x daily - 7 x weekly - 3 sets - 10 reps - Seated Ankle Eversion with Resistance  - 1 x daily - 7 x weekly - 3 sets - 10 reps - Seated Ankle Inversion with Resistance  - 1 x daily - 7 x weekly - 3 sets - 10 reps - Toe Yoga - Alternating Great Toe and Lesser Toe Extension  - 1 x daily - 7 x weekly - 3 sets - 10 reps - Anti-Rotation Lateral Stepping with Press  - 1 x daily - 7 x weekly - 1-2 sets - 10 reps - 2-3 sec  hold - Single Leg Stance  - 1 x daily - 7 x weekly - 2 sets - 5 reps - 10-20 sec hold - Seated Hip Internal Rotation with Ball and Resistance  - 2 x daily - 7 x weekly - 1 sets - 10 reps - 3 sec  hold - Sidelying Reverse Clamshell  - 1 x daily - 7 x weekly - 3 sets - 10 reps - Clamshell  - 1 x daily - 7 x weekly - 3 sets - 10 reps - Sidelying Diagonal Hip Abduction  - 1 x daily - 7 x  weekly - 3 sets - 10 reps - Marching with Resistance  - 1 x daily - 7 x weekly - 3 sets - 10 reps - Step Up  - 1 x daily - 7 x weekly - 2-3 sets - 10 reps - 2 sec  hold - Standing Hip Extension with Resistance at Ankles and Unilateral Counter Support  - 1 x daily - 7 x weekly - 2-3 sets - 10 reps - 3 sec  hold - Standing Hip Abduction with Resistance at Ankles and Counter Support  - 1 x daily - 7 x weekly - 2-3 sets - 10 reps - 3 sec  hold - Side Stepping with Resistance at Feet  - 1 x daily - 7 x weekly - Mini Squat with Counter Support  - 2 x daily - 7 x weekly - 1-2 sets - 10 reps - 3 sec  hold ASSESSMENT:  CLINICAL IMPRESSION: Session focused on extensions, core and hip strength to reduce pain down to Rt foot. Pt with good tolerance to exercises during session. Continued with postural correction, stretching pecs, strengthening for posterior shoulder  girdle; DN/manual work through the suboccipitals and posterior cervical musculature. Will assess response to DN and determine need for further treatment to address headaches and TMJ dysfunction.   OBJECTIVE IMPAIRMENTS: pain in the neck and jaw as well as into the shoulder area Lt > Rt. Patient presents with poor posture and alignment; limited cervical mobility and ROM; discomfort with TMD opening and lateral deviation; muscular tightness to palpation through the ant/lat/posterior cervical musculature and jaw areas; weakness in postural musculature of upper core; pain with functional activities including chewing.decreased mobility, decreased ROM, decreased strength, hypomobility, increased fascial restrictions, increased muscle spasms, impaired flexibility, improper body mechanics, postural dysfunction, and pain.    GOALS: Goals reviewed with patient? Yes  SHORT TERM GOALS: Target date: 08/28/2022  Independent in initial HEP  Baseline:  Goal status: INITIAL  2.  Instruct patient in improved posture and alignment with functional activities and sitting/standing  Baseline:  Goal status: INITIAL   LONG TERM GOALS: Target date: 09/26/2022  Increase cervical mobility and ROM to WNL's throughout with decreased stiffness with ROM  Baseline:  Goal status: INITIAL  2.  Improve posture and alignment with patient to demonstrate improved upright posture with posterior shoulder girdle musculature engaged  Baseline:  Goal status: INITIAL  3.  Improve TMD opening with patient to report ability chew foods of normal textures  Baseline:  Goal status: INITIAL  4.  Decrease pain in neck and jaw by 50-75% allowing patient to exercise and perform normal functional activities  Baseline:  Goal status: INITIAL  5.  Independent in HEP including aquatic program as indicated  Baseline:  Goal status: INITIAL  6.  Improve functional limitation score to 56  Baseline:  Goal status: INITIAL  7.  Pt will  improve Rt ankle strength to 4+/5 to improve standing and walking tolerance Baseline:  Goal status: INITIAL  8.  Pt will improve Rt SLS to >= 15 seconds to demo improved balance Baseline:  Goal status: INITIAL     PLAN:  PT FREQUENCY: 2x/week  PT DURATION: 8 weeks  PLANNED INTERVENTIONS: Therapeutic exercises, Therapeutic activity, Neuromuscular re-education, Patient/Family education, Self Care, Joint mobilization, Aquatic Therapy, Dry Needling, Electrical stimulation, Spinal mobilization, Cryotherapy, Moist heat, Taping, Traction, Ultrasound, Ionotophoresis '4mg'$ /ml Dexamethasone, Manual therapy, and Re-evaluation  PLAN FOR NEXT SESSION: ankle and hip strength and balance, core strength. review and progress exercise; continue with postural  correction and eduction; manual work, DN, modalities as indicated    [employer], PT 08/24/2022, 11:10 AM

## 2022-08-25 NOTE — Telephone Encounter (Signed)
Last OV: 08/17/22 Next OV: none scheduled Last RF: 06/06/22

## 2022-08-29 ENCOUNTER — Encounter: Payer: Self-pay | Admitting: Family Medicine

## 2022-08-30 ENCOUNTER — Encounter: Payer: Self-pay | Admitting: Rehabilitative and Restorative Service Providers"

## 2022-08-30 ENCOUNTER — Ambulatory Visit: Payer: PPO | Admitting: Rehabilitative and Restorative Service Providers"

## 2022-08-30 DIAGNOSIS — M26609 Unspecified temporomandibular joint disorder, unspecified side: Secondary | ICD-10-CM

## 2022-08-30 DIAGNOSIS — M6281 Muscle weakness (generalized): Secondary | ICD-10-CM

## 2022-08-30 DIAGNOSIS — M539 Dorsopathy, unspecified: Secondary | ICD-10-CM

## 2022-08-30 DIAGNOSIS — R29898 Other symptoms and signs involving the musculoskeletal system: Secondary | ICD-10-CM | POA: Diagnosis not present

## 2022-08-30 NOTE — Therapy (Signed)
OUTPATIENT PHYSICAL THERAPY  TREATMENT   Patient Name: Sheila Harris MRN: [id number] DOB:1955-07-11, 67 y.o., female Today's Date: 08/30/2022  END OF SESSION:  PT End of Session - 08/30/22 1408     Visit Number 9    Number of Visits 16    Date for PT Re-Evaluation 09/26/22    Authorization Type [employer] $15 copay; $3200 max    PT Start Time 1407    PT Stop Time 1445    PT Time Calculation (min) 38 min    Activity Tolerance Patient tolerated treatment well              Past Medical History:  Diagnosis Date   Genital herpes    Macular degeneration    Thyroid disease    History reviewed. No pertinent surgical history. Patient Active Problem List   Diagnosis Date Noted   Metatarsalgia of right foot 03/06/2022   Iron deficiency 02/23/2022   Dry age-related macular degeneration 12/08/2021   Chronic cough 08/11/2021   Chronic foot pain, right 07/12/2021   Left shoulder pain 05/05/2021   Hormone replacement therapy (HRT) 07/08/2020   Corn of foot right, 4/5 interspace 06/07/2020   Recurrent low back pain 11/19/2019   Herpes infection 11/19/2019   Osteoporosis 11/19/2019   Factor V Leiden ([place]) 11/19/2019   Hypothyroidism 11/19/2019   Anxiety 11/19/2019   Trigger finger of right hand 06/07/2016   Carpal tunnel syndrome, right 02/22/2016   Primary osteoarthritis of right hand 12/30/2015   Right cervical radiculopathy 12/30/2015   Trigger finger of left hand 10/08/2015   Pes cavus 11/21/2013   Right lumbar radiculopathy 11/21/2013    PCP: Doran Heater  REFERRING PROVIDER: Dr Aundria Mems,  Dina Rich, DPM   REFERRING DIAG: Cervical Radiculopathy, Lumbar radiculopathy  THERAPY DIAG:  Other symptoms and signs involving the musculoskeletal system  Muscle weakness (generalized)  Cervical dysfunction  TMJ dysfunction  Rationale for Evaluation and Treatment: Rehabilitation  ONSET DATE: 06/02/22  SUBJECTIVE:                                                                                                                                                                                                          SUBJECTIVE STATEMENT: Pt states the masseter muscles are feeling better. She has continued tightness behind her ears and in her scalp.   PERTINENT HISTORY:  neck pain on the off in the past 3-4 months but she as had pain in the neck over the past several years. She has had some pain in the Rt TMJ  over the years as well. She has an overbite and jaw is tilted. She has seen a chiropractor in the past. She now has difficulty chewing and has not chewed in the past 2 weeks Chronic pain in the TMJ and neck; costochondritis; arthritis; osteoarthritis   Patient has been seeing podiatry for about 1 year. She had a shot for mortons neuroma which did not help. She also had a stress fracture of Rt 4th metatarsal and wore a boot and had physical therapy. She has also had back issues for "decades" on and off and now MD and DPM believe foot pain may be coming from her back. 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 Pt states pain only "occasionally" goes down her leg, mostly pain is just in her Rt foot and toes and in her back. Pain in back and foot increases with prolonged standing and walking (20-30 minutes)  PAIN:  Are you having pain? Yes: NPRS scale: 2-3/10 Pain location: Lt neck and jaw area; sometimes Rt  Pain description: sharp Aggravating factors: exercising with UE's overhead; sleeping on side   Relieving factors: massaging in the neck; heat; ice   PRECAUTIONS: None  WEIGHT BEARING RESTRICTIONS: No  FALLS:  Has patient fallen in last 6 months? No   OCCUPATION: retired - household chores; walking 30-60 min 6 days/wk   PATIENT GOALS: strengthen areas to help decrease pain   NEXT MD VISIT: None scheduled  OBJECTIVE:   DIAGNOSTIC FINDINGS:  Xray 05/10/22: No fracture,  dislocation or subluxation. No spondylolisthesis. No osteolytic or osteoblastic changes. Prevertebral and cervical cranial soft tissues are unremarkable. Degenerative disc disease noted with disc space narrowing and marginal osteophytes at C5-6 through C7-T1.  Lumbar MRI shows mild degenerative changes  PATIENT SURVEYS:  FOTO 47 goal 56  POSTURE:  Pt stands with Rt foot in supination Pt reports some symptom relief in foot with trunk extension  Patient presents with head forward posture with increased thoracic kyphosis; shoulders rounded and elevated; scapulae abducted and rotated along the thoracic spine; head of the humerus anterior in orientation.  PALPATION: Lumbar jt mobility WFL for CPAs and UPAs, no TTP lumbar or hip musculature  Muscular tightness ant/lat/posterior cervical musculature; TMD/jaw area Lt > Rt with tenderness into the Lt temple and scalp    CERVICAL ROM: tight end ranges   Active ROM A/PROM (deg) eval  Flexion 46  Extension 45  Right lateral flexion 27  Left lateral flexion 39  Right rotation 60  Left rotation 49   (Blank rows = not tested)   JAW: TMD opening and lateral deviation limited   LOWER EXTREMITY MMT:    MMT Right eval Left eval  Hip flexion 4+ 4+  Hip extension    Hip abduction 4+ 4+  Hip adduction    Hip internal rotation    Hip external rotation    Knee flexion    Knee extension    Ankle dorsiflexion 3   Ankle plantarflexion 3   Ankle inversion 3   Ankle eversion 3    (Blank rows = not tested)  LUMBAR ROM:   Active  A/PROM  eval  Flexion Limited 25%  Extension WFL  Right lateral flexion Limited 50%  Left lateral flexion Limited 50%  Right rotation Limited 50%  Left rotation Limited 50%   (Blank rows = not tested)  SLS Rt: 1 second  [facility] Adult PT Treatment:                                                  DATE: 08/30/22 Therapeutic Exercise: UBE L4 x 4 min alt fwd/back  Diver x 10  SLS w/ eyes closed 20 sec x 2 Rt/Lt  SLS with eyes closed  SLS on foam pad Tandem stance each foot forward  Headache/TMJ Doorway stretch 3 positions 30 sec x 2  Scap squeeze with noodle 5 sec x 10  L's with yellow TB x 20  W's yellow TB x 20 Wall angel x 20 prone scap squeeze with axial extension 5 sec x 5  Manual:  Soft tissue mobilization; deep tissue work through the ant/lat/post cervical musculature; pecs; upper trap; scalp  Trigger Point Dry-Needling  Treatment instructions: Expect mild to moderate muscle soreness. S/S of pneumothorax if dry needled over a lung field, and to seek immediate medical attention should they occur. Patient verbalized understanding of these instructions and education.  Patient Consent Given: Yes Education handout provided: Previously provided Muscles treated: suboccipitals; SCM; cervical paraspinals  Electrical stimulation performed: No Parameters:  Treatment response/outcome:   Modalities Moist heat cervical and thoracic area x 10 min   DATE: 08/24/22 Therapeutic Exercise: Nustep L7 x 10 min for warm up Slant board calf stretch 30 sec x 2  Step up forward 6 in 10 x 2 Rt/Lt   Hip extension green TB ankles x 20 Rt/Lt Hip abduction green TB ankles x 20 Rt/Lt Mini squat UE support as needed x 10 x 2 Side steps green TB ankles 10 ft x 5 Rt/Lt  Heel raises x 10 x 2 Toe raises x 10 x 2 SLS 2 x 30 sec bilat Antirotation green 10 Rt/Lt  Headache/TMJ Doorway stretch 3 positions 30 sec x 2  Scap squeeze with noodle 5 sec x 10  L's with yellow TB x 20  W's yellow TB x 20 Wall angel x 20  Manual:  Soft tissue mobilization; deep tissue work through the ant/lat/post cervical musculature; pecs; upper trap; scalp  Modalities Moist heat cervical and thoracic area x 10 min   DATE: 08/22/22 Therapeutic Exercise: Nustep L7 x 5 min for warm up Cobra 10 x 10 sec Prone  hip ext x 10 bilat with cues for core contraction prior to lift Sidelying hip abd x 10 Sidelying hip arcs with heel/toe tap x 10 Reverse clam with ball between knees x 15 Clam with ball between ankles x 15 Heel raises x 10 Toe raises x 10 SLS 2 x 30 sec bilat Marching red TB x 10 bilat Headache/TMJ Doorway stretch 3 positions 30 sec x 2  Scap squeeze with noodle 5 sec x 10  L's with yellow TB x 20  W's yellow TB x 20 Wall angel x 20  Manual:  Soft tissue mobilization to assess response to manual work and DN  Trigger Point Dry-Needling  Treatment instructions: Expect mild to moderate muscle soreness. S/S of pneumothorax if dry needled over a lung field, and to seek immediate medical attention should they occur. Patient verbalized understanding of these instructions and education.  Patient Consent Given: Yes Education handout provided: Previously provided Muscles treated: suboccipitals; cervical paraspinals; SCM at posterior mandible Electrical stimulation performed: No Parameters: N/A Treatment response/outcome: decreased palpable tightness noted following treatment  Modalities Moist heat  cervical and thoracic area x 10 min    PATIENT EDUCATION:  Education details: POC; HEP  Person educated: Patient Education method: Explanation, Demonstration, Tactile cues, Verbal cues, and Handouts Education comprehension: verbalized understanding, returned demonstration, verbal cues required, tactile cues required, and needs further education  HOME EXERCISE PROGRAM: Access Code: [email or web address] URL: [email or web address] Date: 08/30/2022 Prepared by: Gillermo Murdoch  Exercises - Supine Cervical Retraction with Towel  - 2 x daily - 7 x weekly - 1 sets - 5-10 reps - 10 sec  hold - Standing Cervical Retraction  - 2 x daily - 7 x weekly - 1 sets - 10 reps - 5-10 sec  hold - Seated Scapular Retraction  - 2 x daily - 7 x weekly - 1-2 sets - 10 reps - 10 sec  hold - Shoulder External  Rotation and Scapular Retraction  - 3 x daily - 7 x weekly - 1 sets - 10 reps - 3-5 sec   hold - Shoulder External Rotation in 45 Degrees Abduction  - 2 x daily - 7 x weekly - 1-2 sets - 10 reps - 3 sec  hold - Shoulder External Rotation and Scapular Retraction with Resistance  - 2 x daily - 7 x weekly - 1 sets - 10 reps - 3-5 sec  hold - Shoulder W - External Rotation with Resistance  - 2 x daily - 7 x weekly - 1-2 sets - 10 reps - 3 sec  hold - Supine Scapular Retraction  - 2 x daily - 7 x weekly - 1 sets - 10 reps - 5-10 sec  hold - Standing Bilateral Low Shoulder Row with Anchored Resistance  - 2 x daily - 7 x weekly - 1-3 sets - 10 reps - 2-3 sec  hold - Drawing Bow  - 1 x daily - 7 x weekly - 1 sets - 10 reps - 3 sec  hold - Standing High Row with Resistance  - 1 x daily - 7 x weekly - 1 sets - 10 reps - 3-5 sec  hold - Cobra  - 1 x daily - 7 x weekly - 1 sets - 10 reps - 10 seconds hold - Heel Raises with Counter Support  - 1 x daily - 7 x weekly - 3 sets - 10 reps - Heel Toe Raises with Counter Support  - 1 x daily - 7 x weekly - 3 sets - 10 reps - Seated Ankle Eversion with Resistance  - 1 x daily - 7 x weekly - 3 sets - 10 reps - Seated Ankle Inversion with Resistance  - 1 x daily - 7 x weekly - 3 sets - 10 reps - Toe Yoga - Alternating Great Toe and Lesser Toe Extension  - 1 x daily - 7 x weekly - 3 sets - 10 reps - Anti-Rotation Lateral Stepping with Press  - 1 x daily - 7 x weekly - 1-2 sets - 10 reps - 2-3 sec  hold - Single Leg Stance  - 1 x daily - 7 x weekly - 2 sets - 5 reps - 10-20 sec hold - Seated Hip Internal Rotation with Ball and Resistance  - 2 x daily - 7 x weekly - 1 sets - 10 reps - 3 sec  hold - Sidelying Reverse Clamshell  - 1 x daily - 7 x weekly - 3 sets - 10 reps - Clamshell  - 1 x daily - 7 x weekly - 3 sets - 10 reps -  Sidelying Diagonal Hip Abduction  - 1 x daily - 7 x weekly - 3 sets - 10 reps - Marching with Resistance  - 1 x daily - 7 x weekly - 3 sets - 10  reps - Step Up  - 1 x daily - 7 x weekly - 2-3 sets - 10 reps - 2 sec  hold - Standing Hip Extension with Resistance at Ankles and Unilateral Counter Support  - 1 x daily - 7 x weekly - 2-3 sets - 10 reps - 3 sec  hold - Standing Hip Abduction with Resistance at Ankles and Counter Support  - 1 x daily - 7 x weekly - 2-3 sets - 10 reps - 3 sec  hold - Side Stepping with Resistance at Feet  - 1 x daily - 7 x weekly - Mini Squat with Counter Support  - 2 x daily - 7 x weekly - 1-2 sets - 10 reps - 3 sec  hold - The Diver  - 2 x daily - 7 x weekly - 1 sets - 10 reps - 2-3 sec  hold - Single Leg Balance with Eyes Closed  - 2 x daily - 7 x weekly - 1 sets - 3 reps - 20-30 sec  hold - Romberg Stance Eyes Closed on Foam Pad  - 2 x daily - 7 x weekly - 1 sets - 3 reps - 30 sec  hold - Standing with Head Rotation  - 2 x daily - 7 x weekly - 1 sets - 3 reps - 20-30 sec  hold - Half Tandem Stance Balance with Eyes Closed  - 2 x daily - 7 x weekly - 1 sets - 3 reps - 2-30 sec  hold ASSESSMENT:  CLINICAL IMPRESSION: Session focused on balance - at patient's request. Continued with exercises. Continued with postural correction, stretching pecs, strengthening for posterior shoulder girdle; DN/manual work through the suboccipitals and posterior cervical musculature. Will assess response to DN and determine need for further treatment to address headaches and TMJ dysfunction.   OBJECTIVE IMPAIRMENTS: pain in the neck and jaw as well as into the shoulder area Lt > Rt. Patient presents with poor posture and alignment; limited cervical mobility and ROM; discomfort with TMD opening and lateral deviation; muscular tightness to palpation through the ant/lat/posterior cervical musculature and jaw areas; weakness in postural musculature of upper core; pain with functional activities including chewing.decreased mobility, decreased ROM, decreased strength, hypomobility, increased fascial restrictions, increased muscle spasms,  impaired flexibility, improper body mechanics, postural dysfunction, and pain.    GOALS: Goals reviewed with patient? Yes  SHORT TERM GOALS: Target date: 08/28/2022  Independent in initial HEP  Baseline:  Goal status: INITIAL  2.  Instruct patient in improved posture and alignment with functional activities and sitting/standing  Baseline:  Goal status: INITIAL   LONG TERM GOALS: Target date: 09/26/2022  Increase cervical mobility and ROM to WNL's throughout with decreased stiffness with ROM  Baseline:  Goal status: INITIAL  2.  Improve posture and alignment with patient to demonstrate improved upright posture with posterior shoulder girdle musculature engaged  Baseline:  Goal status: INITIAL  3.  Improve TMD opening with patient to report ability chew foods of normal textures  Baseline:  Goal status: INITIAL  4.  Decrease pain in neck and jaw by 50-75% allowing patient to exercise and perform normal functional activities  Baseline:  Goal status: INITIAL  5.  Independent in HEP including aquatic program as indicated  Baseline:  Goal status: INITIAL  6.  Improve functional limitation score to 56  Baseline:  Goal status: INITIAL  7.  Pt will improve Rt ankle strength to 4+/5 to improve standing and walking tolerance Baseline:  Goal status: INITIAL  8.  Pt will improve Rt SLS to >= 15 seconds to demo improved balance Baseline:  Goal status: INITIAL     PLAN:  PT FREQUENCY: 2x/week  PT DURATION: 8 weeks  PLANNED INTERVENTIONS: Therapeutic exercises, Therapeutic activity, Neuromuscular re-education, Patient/Family education, Self Care, Joint mobilization, Aquatic Therapy, Dry Needling, Electrical stimulation, Spinal mobilization, Cryotherapy, Moist heat, Taping, Traction, Ultrasound, Ionotophoresis '4mg'$ /ml Dexamethasone, Manual therapy, and Re-evaluation  PLAN FOR NEXT SESSION: ankle and hip strength and balance, core strength. review and progress exercise;  continue with postural correction and eduction; manual work, DN, modalities as indicated    [employer], PT 08/30/2022, 2:09 PM

## 2022-08-31 ENCOUNTER — Encounter: Payer: Self-pay | Admitting: Family Medicine

## 2022-09-05 ENCOUNTER — Encounter: Payer: Self-pay | Admitting: Physical Therapy

## 2022-09-05 DIAGNOSIS — M9905 Segmental and somatic dysfunction of pelvic region: Secondary | ICD-10-CM | POA: Diagnosis not present

## 2022-09-05 DIAGNOSIS — M9903 Segmental and somatic dysfunction of lumbar region: Secondary | ICD-10-CM | POA: Diagnosis not present

## 2022-09-05 DIAGNOSIS — M9901 Segmental and somatic dysfunction of cervical region: Secondary | ICD-10-CM | POA: Diagnosis not present

## 2022-09-05 DIAGNOSIS — M9902 Segmental and somatic dysfunction of thoracic region: Secondary | ICD-10-CM | POA: Diagnosis not present

## 2022-09-06 NOTE — Therapy (Signed)
OUTPATIENT PHYSICAL THERAPY  TREATMENT   Patient Name: Sheila Harris MRN: [id number] DOB:[birth year], 67 y.o., female Today's Date: 09/07/2022  END OF SESSION:  PT End of Session - 09/07/22 1452     Visit Number 10    Number of Visits 16    Date for PT Re-Evaluation 09/26/22    Authorization Type [employer] $15 copay; $3200 max    PT Start Time 1452    PT Stop Time 1530    PT Time Calculation (min) 38 min    Activity Tolerance Patient tolerated treatment well    Behavior During Therapy WFL for tasks assessed/performed              Past Medical History:  Diagnosis Date   Genital herpes    Macular degeneration    Thyroid disease    History reviewed. No pertinent surgical history. Patient Active Problem List   Diagnosis Date Noted   Metatarsalgia of right foot 03/06/2022   Iron deficiency 02/23/2022   Dry age-related macular degeneration 12/08/2021   Chronic cough 08/11/2021   Chronic foot pain, right 07/12/2021   Left shoulder pain 05/05/2021   Hormone replacement therapy (HRT) 07/08/2020   Corn of foot right, 4/5 interspace 06/07/2020   Recurrent low back pain 11/19/2019   Herpes infection 11/19/2019   Osteoporosis 11/19/2019   Factor V Leiden ([place]) 11/19/2019   Hypothyroidism 11/19/2019   Anxiety 11/19/2019   Trigger finger of right hand 06/07/2016   Carpal tunnel syndrome, right 02/22/2016   Primary osteoarthritis of right hand 12/30/2015   Right cervical radiculopathy 12/30/2015   Trigger finger of left hand 10/08/2015   Pes cavus 11/21/2013   Right lumbar radiculopathy 11/21/2013    PCP: Doran Heater  REFERRING PROVIDER: Dr Aundria Mems,  Dina Rich, DPM   REFERRING DIAG: Cervical Radiculopathy, Lumbar radiculopathy  THERAPY DIAG:  Other symptoms and signs involving the musculoskeletal system  Muscle weakness (generalized)  Cervical dysfunction  TMJ dysfunction  Rationale for Evaluation and Treatment:  Rehabilitation  ONSET DATE: 06/02/22  SUBJECTIVE:                                                                                                                                                                                                         SUBJECTIVE STATEMENT: Since last week I had increased pain due to feet being propped up at end of session. Any walking more than a mile and my piriformis really hurts. Also feeling in  R hip ADDuctors and hip flexors.    PERTINENT HISTORY:  neck pain  on the off in the past 3-4 months but she as had pain in the neck over the past several years. She has had some pain in the Rt TMJ over the years as well. She has an overbite and jaw is tilted. She has seen a chiropractor in the past. She now has difficulty chewing and has not chewed in the past 2 weeks Chronic pain in the TMJ and neck; costochondritis; arthritis; osteoarthritis   Patient has been seeing podiatry for about 1 year. She had a shot for mortons neuroma which did not help. She also had a stress fracture of Rt 4th metatarsal and wore a boot and had physical therapy. She has also had back issues for "decades" on and off and now MD and DPM believe foot pain may be coming from her back. 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 Pt states pain only "occasionally" goes down her leg, mostly pain is just in her Rt foot and toes and in her back. Pain in back and foot increases with prolonged standing and walking (20-30 minutes)  PAIN:  Are you having pain? Yes: NPRS scale: 4/10 Pain location: R hip/groin Pain description: ache Aggravating factors: walking more than a mile Relieving factors: stretches   PRECAUTIONS: None  WEIGHT BEARING RESTRICTIONS: No  FALLS:  Has patient fallen in last 6 months? No   OCCUPATION: retired - household chores; walking 30-60 min 6 days/wk   PATIENT GOALS: strengthen areas to help decrease pain   NEXT MD VISIT: None  scheduled  OBJECTIVE:   DIAGNOSTIC FINDINGS:  Xray 05/10/22: No fracture, dislocation or subluxation. No spondylolisthesis. No osteolytic or osteoblastic changes. Prevertebral and cervical cranial soft tissues are unremarkable. Degenerative disc disease noted with disc space narrowing and marginal osteophytes at C5-6 through C7-T1.  Lumbar MRI shows mild degenerative changes  PATIENT SURVEYS:  FOTO 47 goal 56  POSTURE:  Pt stands with Rt foot in supination Pt reports some symptom relief in foot with trunk extension  Patient presents with head forward posture with increased thoracic kyphosis; shoulders rounded and elevated; scapulae abducted and rotated along the thoracic spine; head of the humerus anterior in orientation.  PALPATION: Lumbar jt mobility WFL for CPAs and UPAs, no TTP lumbar or hip musculature  Muscular tightness ant/lat/posterior cervical musculature; TMD/jaw area Lt > Rt with tenderness into the Lt temple and scalp    CERVICAL ROM: tight end ranges   Active ROM A/PROM (deg) eval  Flexion 46  Extension 45  Right lateral flexion 27  Left lateral flexion 39  Right rotation 60  Left rotation 49   (Blank rows = not tested)   JAW: TMD opening and lateral deviation limited   LOWER EXTREMITY MMT:    MMT Right eval Left eval  Hip flexion 4+ 4+  Hip extension    Hip abduction 4+ 4+  Hip adduction    Hip internal rotation    Hip external rotation    Knee flexion    Knee extension    Ankle dorsiflexion 3   Ankle plantarflexion 3   Ankle inversion 3   Ankle eversion 3    (Blank rows = not tested)  LUMBAR ROM:   Active  A/PROM  eval  Flexion Limited 25%  Extension WFL  Right lateral flexion Limited 50%  Left lateral flexion Limited 50%  Right rotation Limited 50%  Left rotation Limited 50%   (Blank rows = not tested)  SLS Rt: 1  second                                                                                                                              [facility] Adult PT Treatment:                                                  DATE: 09/07/22 Therapeutic Exercise: Nustep L7 x 7 min Tandem stance row 5# x10 B Bil shoulder ext GTB tandem stance x 10 B Dead lift x 3 no weight worked on form SL dead lift R side with left arm reach and R hand on counter 2x 5 R, 1x5 L Manual:  Soft tissue mobilization; deep tissue work through R piriformis with TPR and active clam x 10, adductors  Even pelvic landmarks  DATE: 08/30/22 Therapeutic Exercise: UBE L4 x 4 min alt fwd/back  Diver x 10  SLS w/ eyes closed 20 sec x 2 Rt/Lt  SLS with eyes closed  SLS on foam pad Tandem stance each foot forward  Headache/TMJ Doorway stretch 3 positions 30 sec x 2  Scap squeeze with noodle 5 sec x 10  L's with yellow TB x 20  W's yellow TB x 20 Wall angel x 20 prone scap squeeze with axial extension 5 sec x 5  Manual:  Soft tissue mobilization; deep tissue work through the ant/lat/post cervical musculature; pecs; upper trap; scalp  Trigger Point Dry-Needling  Treatment instructions: Expect mild to moderate muscle soreness. S/S of pneumothorax if dry needled over a lung field, and to seek immediate medical attention should they occur. Patient verbalized understanding of these instructions and education.  Patient Consent Given: Yes Education handout provided: Previously provided Muscles treated: suboccipitals; SCM; cervical paraspinals  Electrical stimulation performed: No Parameters:  Treatment response/outcome:   Modalities Moist heat cervical and thoracic area x 10 min   DATE: 08/24/22 Therapeutic Exercise: Nustep L7 x 10 min for warm up Slant board calf stretch 30 sec x 2  Step up forward 6 in 10 x 2 Rt/Lt   Hip extension green TB ankles x 20 Rt/Lt Hip abduction green TB ankles x 20 Rt/Lt Mini squat UE support as needed x 10 x 2 Side steps green TB ankles 10 ft x 5 Rt/Lt  Heel raises x 10 x 2 Toe raises x 10 x 2 SLS 2 x 30 sec  bilat Antirotation green 10 Rt/Lt  Headache/TMJ Doorway stretch 3 positions 30 sec x 2  Scap squeeze with noodle 5 sec x 10  L's with yellow TB x 20  W's yellow TB x 20 Wall angel x 20  Manual:  Soft tissue mobilization; deep tissue work through the ant/lat/post cervical musculature; pecs; upper trap; scalp  Modalities Moist heat cervical and thoracic area x 10 min   DATE: 08/22/22 Therapeutic  Exercise: Nustep L7 x 5 min for warm up Cobra 10 x 10 sec Prone hip ext x 10 bilat with cues for core contraction prior to lift Sidelying hip abd x 10 Sidelying hip arcs with heel/toe tap x 10 Reverse clam with ball between knees x 15 Clam with ball between ankles x 15 Heel raises x 10 Toe raises x 10 SLS 2 x 30 sec bilat Marching red TB x 10 bilat Headache/TMJ Doorway stretch 3 positions 30 sec x 2  Scap squeeze with noodle 5 sec x 10  L's with yellow TB x 20  W's yellow TB x 20 Wall angel x 20  Manual:  Soft tissue mobilization to assess response to manual work and DN  Trigger Point Dry-Needling  Treatment instructions: Expect mild to moderate muscle soreness. S/S of pneumothorax if dry needled over a lung field, and to seek immediate medical attention should they occur. Patient verbalized understanding of these instructions and education.  Patient Consent Given: Yes Education handout provided: Previously provided Muscles treated: suboccipitals; cervical paraspinals; SCM at posterior mandible Electrical stimulation performed: No Parameters: N/A Treatment response/outcome: decreased palpable tightness noted following treatment  Modalities Moist heat cervical and thoracic area x 10 min    PATIENT EDUCATION:  Education details: POC; HEP  Person educated: Patient Education method: Consulting civil engineer, Demonstration, Tactile cues, Verbal cues, and Handouts Education comprehension: verbalized understanding, returned demonstration, verbal cues required, tactile cues required, and needs  further education  HOME EXERCISE PROGRAM: Access Code: [email or web address] URL: [email or web address] Date: 09/07/2022 Prepared by: Almyra Free  Exercises - Supine Cervical Retraction with Towel  - 2 x daily - 7 x weekly - 1 sets - 5-10 reps - 10 sec  hold - Standing Cervical Retraction  - 2 x daily - 7 x weekly - 1 sets - 10 reps - 5-10 sec  hold - Seated Scapular Retraction  - 2 x daily - 7 x weekly - 1-2 sets - 10 reps - 10 sec  hold - Shoulder External Rotation and Scapular Retraction  - 3 x daily - 7 x weekly - 1 sets - 10 reps - 3-5 sec   hold - Shoulder External Rotation in 45 Degrees Abduction  - 2 x daily - 7 x weekly - 1-2 sets - 10 reps - 3 sec  hold - Shoulder External Rotation and Scapular Retraction with Resistance  - 2 x daily - 7 x weekly - 1 sets - 10 reps - 3-5 sec  hold - Shoulder W - External Rotation with Resistance  - 2 x daily - 7 x weekly - 1-2 sets - 10 reps - 3 sec  hold - Supine Scapular Retraction  - 2 x daily - 7 x weekly - 1 sets - 10 reps - 5-10 sec  hold - Standing Bilateral Low Shoulder Row with Anchored Resistance  - 2 x daily - 7 x weekly - 1-3 sets - 10 reps - 2-3 sec  hold - Drawing Bow  - 1 x daily - 7 x weekly - 1 sets - 10 reps - 3 sec  hold - Standing High Row with Resistance  - 1 x daily - 7 x weekly - 1 sets - 10 reps - 3-5 sec  hold - Cobra  - 1 x daily - 7 x weekly - 1 sets - 10 reps - 10 seconds hold - Heel Raises with Counter Support  - 1 x daily - 7 x weekly - 3 sets - 10 reps - Heel Toe  Raises with Counter Support  - 1 x daily - 7 x weekly - 3 sets - 10 reps - Seated Ankle Eversion with Resistance  - 1 x daily - 7 x weekly - 3 sets - 10 reps - Seated Ankle Inversion with Resistance  - 1 x daily - 7 x weekly - 3 sets - 10 reps - Toe Yoga - Alternating Great Toe and Lesser Toe Extension  - 1 x daily - 7 x weekly - 3 sets - 10 reps - Anti-Rotation Lateral Stepping with Press  - 1 x daily - 7 x weekly - 1-2 sets - 10 reps - 2-3 sec  hold - Single  Leg Stance  - 1 x daily - 7 x weekly - 2 sets - 5 reps - 10-20 sec hold - Seated Hip Internal Rotation with Ball and Resistance  - 2 x daily - 7 x weekly - 1 sets - 10 reps - 3 sec  hold - Sidelying Reverse Clamshell  - 1 x daily - 7 x weekly - 3 sets - 10 reps - Clamshell  - 1 x daily - 7 x weekly - 3 sets - 10 reps - Sidelying Diagonal Hip Abduction  - 1 x daily - 7 x weekly - 3 sets - 10 reps - Marching with Resistance  - 1 x daily - 7 x weekly - 3 sets - 10 reps - Step Up  - 1 x daily - 7 x weekly - 2-3 sets - 10 reps - 2 sec  hold - Standing Hip Extension with Resistance at Ankles and Unilateral Counter Support  - 1 x daily - 7 x weekly - 2-3 sets - 10 reps - 3 sec  hold - Standing Hip Abduction with Resistance at Ankles and Counter Support  - 1 x daily - 7 x weekly - 2-3 sets - 10 reps - 3 sec  hold - Side Stepping with Resistance at Feet  - 1 x daily - 7 x weekly - Mini Squat with Counter Support  - 2 x daily - 7 x weekly - 1-2 sets - 10 reps - 3 sec  hold - The Diver  - 2 x daily - 7 x weekly - 1 sets - 10 reps - 2-3 sec  hold - Single Leg Balance with Eyes Closed  - 2 x daily - 7 x weekly - 1 sets - 3 reps - 20-30 sec  hold - Romberg Stance Eyes Closed on Foam Pad  - 2 x daily - 7 x weekly - 1 sets - 3 reps - 30 sec  hold - Standing with Head Rotation  - 2 x daily - 7 x weekly - 1 sets - 3 reps - 20-30 sec  hold - Half Tandem Stance Balance with Eyes Closed  - 2 x daily - 7 x weekly - 1 sets - 3 reps - 2-30 sec  hold - Forward T with Counter Support  - 1 x daily - 7 x weekly - 1-3 sets - 10 reps ASSESSMENT:  CLINICAL IMPRESSION: Alleyne reports pain in R hip today limiting her walking. She has TTP at ischial tuberosity and proximal HS and is also tender in ADDuctors. Her R piriformis was markedly tight as well. She reports that she stretches the HS a lot, so PT advised her to stop and instead work on eccentric strengthening with deadlifts and SL RDL; the latter was issued to HEP. She may  benefit from DN to her proximal HS  as well. She demonstrates even pelvic landmarks. We also progressed her rows and shoulder extensions to a tandem stance to challenge her core more as her 65LB dog pulling on her also flared her hip up the other day. Tarnisha continues to demonstrate potential for improvement and would benefit from continued skilled therapy to address impairments.    OBJECTIVE IMPAIRMENTS: pain in the neck and jaw as well as into the shoulder area Lt > Rt. Patient presents with poor posture and alignment; limited cervical mobility and ROM; discomfort with TMD opening and lateral deviation; muscular tightness to palpation through the ant/lat/posterior cervical musculature and jaw areas; weakness in postural musculature of upper core; pain with functional activities including chewing.decreased mobility, decreased ROM, decreased strength, hypomobility, increased fascial restrictions, increased muscle spasms, impaired flexibility, improper body mechanics, postural dysfunction, and pain.    GOALS: Goals reviewed with patient? Yes  SHORT TERM GOALS: Target date: 08/28/2022  Independent in initial HEP  Baseline:  Goal status: INITIAL  2.  Instruct patient in improved posture and alignment with functional activities and sitting/standing  Baseline:  Goal status: INITIAL   LONG TERM GOALS: Target date: 09/26/2022  Increase cervical mobility and ROM to WNL's throughout with decreased stiffness with ROM  Baseline:  Goal status: INITIAL  2.  Improve posture and alignment with patient to demonstrate improved upright posture with posterior shoulder girdle musculature engaged  Baseline:  Goal status: INITIAL  3.  Improve TMD opening with patient to report ability chew foods of normal textures  Baseline:  Goal status: INITIAL  4.  Decrease pain in neck and jaw by 50-75% allowing patient to exercise and perform normal functional activities  Baseline:  Goal status: INITIAL  5.   Independent in HEP including aquatic program as indicated  Baseline:  Goal status: INITIAL  6.  Improve functional limitation score to 56  Baseline:  Goal status: INITIAL  7.  Pt will improve Rt ankle strength to 4+/5 to improve standing and walking tolerance Baseline:  Goal status: INITIAL  8.  Pt will improve Rt SLS to >= 15 seconds to demo improved balance Baseline:  Goal status: INITIAL     PLAN:  PT FREQUENCY: 2x/week  PT DURATION: 8 weeks  PLANNED INTERVENTIONS: Therapeutic exercises, Therapeutic activity, Neuromuscular re-education, Patient/Family education, Self Care, Joint mobilization, Aquatic Therapy, Dry Needling, Electrical stimulation, Spinal mobilization, Cryotherapy, Moist heat, Taping, Traction, Ultrasound, Ionotophoresis 4mg /ml Dexamethasone, Manual therapy, and Re-evaluation  PLAN FOR NEXT SESSION: DN prox R HS, eccentric strengthening; ankle and hip strength and balance, core strength. review and progress exercise; continue with postural correction and eduction; manual work, DN, modalities as indicated    Briar Sword, PT 09/07/2022, 3:31 PM

## 2022-09-07 ENCOUNTER — Ambulatory Visit: Payer: PPO | Admitting: Physical Therapy

## 2022-09-07 ENCOUNTER — Encounter: Payer: Self-pay | Admitting: Physical Therapy

## 2022-09-07 DIAGNOSIS — M539 Dorsopathy, unspecified: Secondary | ICD-10-CM

## 2022-09-07 DIAGNOSIS — R29898 Other symptoms and signs involving the musculoskeletal system: Secondary | ICD-10-CM | POA: Diagnosis not present

## 2022-09-07 DIAGNOSIS — M6281 Muscle weakness (generalized): Secondary | ICD-10-CM

## 2022-09-07 DIAGNOSIS — M26609 Unspecified temporomandibular joint disorder, unspecified side: Secondary | ICD-10-CM

## 2022-09-12 ENCOUNTER — Ambulatory Visit: Payer: PPO | Admitting: Rehabilitative and Restorative Service Providers"

## 2022-09-12 ENCOUNTER — Encounter: Payer: Self-pay | Admitting: Rehabilitative and Restorative Service Providers"

## 2022-09-12 DIAGNOSIS — R29898 Other symptoms and signs involving the musculoskeletal system: Secondary | ICD-10-CM

## 2022-09-12 DIAGNOSIS — M26609 Unspecified temporomandibular joint disorder, unspecified side: Secondary | ICD-10-CM

## 2022-09-12 DIAGNOSIS — M539 Dorsopathy, unspecified: Secondary | ICD-10-CM

## 2022-09-12 DIAGNOSIS — M6281 Muscle weakness (generalized): Secondary | ICD-10-CM

## 2022-09-12 NOTE — Therapy (Signed)
OUTPATIENT PHYSICAL THERAPY  TREATMENT   Patient Name: Sheila Harris MRN: [id number] DOB:08-15-1955, 67 y.o., female Today's Date: 09/12/2022  END OF SESSION:  PT End of Session - 09/12/22 1155     Visit Number 11    Number of Visits 16    Date for PT Re-Evaluation 09/26/22    Authorization Type [employer] $15 copay; $3200 max    PT Start Time 1153    PT Stop Time 1232    PT Time Calculation (min) 39 min    Activity Tolerance Patient tolerated treatment well              Past Medical History:  Diagnosis Date   Genital herpes    Macular degeneration    Thyroid disease    History reviewed. No pertinent surgical history. Patient Active Problem List   Diagnosis Date Noted   Metatarsalgia of right foot 03/06/2022   Iron deficiency 02/23/2022   Dry age-related macular degeneration 12/08/2021   Chronic cough 08/11/2021   Chronic foot pain, right 07/12/2021   Left shoulder pain 05/05/2021   Hormone replacement therapy (HRT) 07/08/2020   Corn of foot right, 4/5 interspace 06/07/2020   Recurrent low back pain 11/19/2019   Herpes infection 11/19/2019   Osteoporosis 11/19/2019   Factor V Leiden ([place]) 11/19/2019   Hypothyroidism 11/19/2019   Anxiety 11/19/2019   Trigger finger of right hand 06/07/2016   Carpal tunnel syndrome, right 02/22/2016   Primary osteoarthritis of right hand 12/30/2015   Right cervical radiculopathy 12/30/2015   Trigger finger of left hand 10/08/2015   Pes cavus 11/21/2013   Right lumbar radiculopathy 11/21/2013    PCP: Doran Heater  REFERRING PROVIDER: Dr Aundria Mems,  Dina Rich, DPM   REFERRING DIAG: Cervical Radiculopathy, Lumbar radiculopathy  THERAPY DIAG:  Other symptoms and signs involving the musculoskeletal system  Muscle weakness (generalized)  Cervical dysfunction  TMJ dysfunction  Rationale for Evaluation and Treatment: Rehabilitation  ONSET DATE: 06/02/22  SUBJECTIVE:                                                                                                                                                                                                          SUBJECTIVE STATEMENT: Doing okay - some tightness in the Lt side of he neck. Working on the internal massage for the TMJ and also continuing to work on exercises at home. Had a flare up of back pain from the bent forward exercise at last visit. Back has calmed down now.   PERTINENT HISTORY:  neck pain  on the off in the past 3-4 months but she as had pain in the neck over the past several years. She has had some pain in the Rt TMJ over the years as well. She has an overbite and jaw is tilted. She has seen a chiropractor in the past. She now has difficulty chewing and has not chewed in the past 2 weeks Chronic pain in the TMJ and neck; costochondritis; arthritis; osteoarthritis   Patient has been seeing podiatry for about 1 year. She had a shot for mortons neuroma which did not help. She also had a stress fracture of Rt 4th metatarsal and wore a boot and had physical therapy. She has also had back issues for "decades" on and off and now MD and DPM believe foot pain may be coming from her back. 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 Pt states pain only "occasionally" goes down her leg, mostly pain is just in her Rt foot and toes and in her back. Pain in back and foot increases with prolonged standing and walking (20-30 minutes)  PAIN:  Are you having pain? Yes: NPRS scale: 4/10 Pain location: R hip/groin Pain description: ache Aggravating factors: walking more than a mile Relieving factors: stretches   PRECAUTIONS: None  WEIGHT BEARING RESTRICTIONS: No  FALLS:  Has patient fallen in last 6 months? No   OCCUPATION: retired - household chores; walking 30-60 min 6 days/wk   PATIENT GOALS: strengthen areas to help decrease pain   NEXT MD VISIT: None scheduled  OBJECTIVE:    DIAGNOSTIC FINDINGS:  Xray 05/10/22: No fracture, dislocation or subluxation. No spondylolisthesis. No osteolytic or osteoblastic changes. Prevertebral and cervical cranial soft tissues are unremarkable. Degenerative disc disease noted with disc space narrowing and marginal osteophytes at C5-6 through C7-T1.  Lumbar MRI shows mild degenerative changes  PATIENT SURVEYS:  FOTO 47 goal 56  POSTURE:  Pt stands with Rt foot in supination Pt reports some symptom relief in foot with trunk extension  Patient presents with head forward posture with increased thoracic kyphosis; shoulders rounded and elevated; scapulae abducted and rotated along the thoracic spine; head of the humerus anterior in orientation.  PALPATION: Lumbar jt mobility WFL for CPAs and UPAs, no TTP lumbar or hip musculature  Muscular tightness ant/lat/posterior cervical musculature; TMD/jaw area Lt > Rt with tenderness into the Lt temple and scalp    CERVICAL ROM: tight end ranges   Active ROM A/PROM (deg) eval  Flexion 46  Extension 45  Right lateral flexion 27  Left lateral flexion 39  Right rotation 60  Left rotation 49   (Blank rows = not tested)   JAW: TMD opening and lateral deviation limited   LOWER EXTREMITY MMT:    MMT Right eval Left eval  Hip flexion 4+ 4+  Hip extension    Hip abduction 4+ 4+  Hip adduction    Hip internal rotation    Hip external rotation    Knee flexion    Knee extension    Ankle dorsiflexion 3   Ankle plantarflexion 3   Ankle inversion 3   Ankle eversion 3    (Blank rows = not tested)  LUMBAR ROM:   Active  A/PROM  eval  Flexion Limited 25%  Extension WFL  Right lateral flexion Limited 50%  Left lateral flexion Limited 50%  Right rotation Limited 50%  Left rotation Limited 50%   (Blank rows = not tested)  SLS Rt: 1  second                                                                                                                             [facility]  PT Treatment:                                                 DATE: 09/12/22: Headache/TMJ Nustep L5 x 5 min  Doorway stretch 3 positions 30 sec x 2  Scap squeeze with noodle 5 sec x 10  L's with yellow TB x 20  W's yellow TB x 20 Wall angel x 20 prone scap squeeze with axial extension 5 sec x 5  Manual:  Soft tissue mobilization; deep tissue work through the ant/lat/post cervical musculature; pecs; upper trap; scalp  Trigger Point Dry-Needling  Treatment instructions: Expect mild to moderate muscle soreness. S/S of pneumothorax if dry needled over a lung field, and to seek immediate medical attention should they occur. Patient verbalized understanding of these instructions and education.  Patient Consent Given: Yes Education handout provided: Previously provided Muscles treated: bilat suboccipitals; SCM; cervical paraspinals; acupuncture point at medial brow line   Electrical stimulation performed: No Parameters:  Treatment response/outcome:   Modalities Moist heat cervical and thoracic area x 10 min  DATE: 09/07/22 Therapeutic Exercise: Nustep L7 x 7 min Tandem stance row 5# x10 B Bil shoulder ext GTB tandem stance x 10 B Dead lift x 3 no weight worked on form SL dead lift R side with left arm reach and R hand on counter 2x 5 R, 1x5 L Manual:  Soft tissue mobilization; deep tissue work through R piriformis with TPR and active clam x 10, adductors  Even pelvic landmarks  DATE: 08/30/22 Therapeutic Exercise: UBE L4 x 4 min alt fwd/back  Diver x 10  SLS w/ eyes closed 20 sec x 2 Rt/Lt  SLS with eyes closed  SLS on foam pad Tandem stance each foot forward  Headache/TMJ Doorway stretch 3 positions 30 sec x 2  Scap squeeze with noodle 5 sec x 10  L's with yellow TB x 20  W's yellow TB x 20 Wall angel x 20 prone scap squeeze with axial extension 5 sec x 5  Manual:  Soft tissue mobilization; deep tissue work through the ant/lat/post cervical musculature; pecs; upper trap;  scalp  Trigger Point Dry-Needling  Treatment instructions: Expect mild to moderate muscle soreness. S/S of pneumothorax if dry needled over a lung field, and to seek immediate medical attention should they occur. Patient verbalized understanding of these instructions and education.  Patient Consent Given: Yes Education handout provided: Previously provided Muscles treated: suboccipitals; SCM; cervical paraspinals  Electrical stimulation performed: No Parameters:  Treatment response/outcome:   Modalities Moist heat cervical and thoracic area x 10 min   PATIENT EDUCATION:  Education details:  POC; HEP  Person educated: Patient Education method: Explanation, Demonstration, Tactile cues, Verbal cues, and Handouts Education comprehension: verbalized understanding, returned demonstration, verbal cues required, tactile cues required, and needs further education  HOME EXERCISE PROGRAM: Access Code: [email or web address] URL: [email or web address] Date: 09/07/2022 Prepared by: Almyra Free  Exercises - Supine Cervical Retraction with Towel  - 2 x daily - 7 x weekly - 1 sets - 5-10 reps - 10 sec  hold - Standing Cervical Retraction  - 2 x daily - 7 x weekly - 1 sets - 10 reps - 5-10 sec  hold - Seated Scapular Retraction  - 2 x daily - 7 x weekly - 1-2 sets - 10 reps - 10 sec  hold - Shoulder External Rotation and Scapular Retraction  - 3 x daily - 7 x weekly - 1 sets - 10 reps - 3-5 sec   hold - Shoulder External Rotation in 45 Degrees Abduction  - 2 x daily - 7 x weekly - 1-2 sets - 10 reps - 3 sec  hold - Shoulder External Rotation and Scapular Retraction with Resistance  - 2 x daily - 7 x weekly - 1 sets - 10 reps - 3-5 sec  hold - Shoulder W - External Rotation with Resistance  - 2 x daily - 7 x weekly - 1-2 sets - 10 reps - 3 sec  hold - Supine Scapular Retraction  - 2 x daily - 7 x weekly - 1 sets - 10 reps - 5-10 sec  hold - Standing Bilateral Low Shoulder Row with Anchored Resistance  - 2  x daily - 7 x weekly - 1-3 sets - 10 reps - 2-3 sec  hold - Drawing Bow  - 1 x daily - 7 x weekly - 1 sets - 10 reps - 3 sec  hold - Standing High Row with Resistance  - 1 x daily - 7 x weekly - 1 sets - 10 reps - 3-5 sec  hold - Cobra  - 1 x daily - 7 x weekly - 1 sets - 10 reps - 10 seconds hold - Heel Raises with Counter Support  - 1 x daily - 7 x weekly - 3 sets - 10 reps - Heel Toe Raises with Counter Support  - 1 x daily - 7 x weekly - 3 sets - 10 reps - Seated Ankle Eversion with Resistance  - 1 x daily - 7 x weekly - 3 sets - 10 reps - Seated Ankle Inversion with Resistance  - 1 x daily - 7 x weekly - 3 sets - 10 reps - Toe Yoga - Alternating Great Toe and Lesser Toe Extension  - 1 x daily - 7 x weekly - 3 sets - 10 reps - Anti-Rotation Lateral Stepping with Press  - 1 x daily - 7 x weekly - 1-2 sets - 10 reps - 2-3 sec  hold - Single Leg Stance  - 1 x daily - 7 x weekly - 2 sets - 5 reps - 10-20 sec hold - Seated Hip Internal Rotation with Ball and Resistance  - 2 x daily - 7 x weekly - 1 sets - 10 reps - 3 sec  hold - Sidelying Reverse Clamshell  - 1 x daily - 7 x weekly - 3 sets - 10 reps - Clamshell  - 1 x daily - 7 x weekly - 3 sets - 10 reps - Sidelying Diagonal Hip Abduction  - 1 x daily - 7 x weekly - 3  sets - 10 reps - Marching with Resistance  - 1 x daily - 7 x weekly - 3 sets - 10 reps - Step Up  - 1 x daily - 7 x weekly - 2-3 sets - 10 reps - 2 sec  hold - Standing Hip Extension with Resistance at Ankles and Unilateral Counter Support  - 1 x daily - 7 x weekly - 2-3 sets - 10 reps - 3 sec  hold - Standing Hip Abduction with Resistance at Ankles and Counter Support  - 1 x daily - 7 x weekly - 2-3 sets - 10 reps - 3 sec  hold - Side Stepping with Resistance at Feet  - 1 x daily - 7 x weekly - Mini Squat with Counter Support  - 2 x daily - 7 x weekly - 1-2 sets - 10 reps - 3 sec  hold - The Diver  - 2 x daily - 7 x weekly - 1 sets - 10 reps - 2-3 sec  hold - Single Leg Balance  with Eyes Closed  - 2 x daily - 7 x weekly - 1 sets - 3 reps - 20-30 sec  hold - Romberg Stance Eyes Closed on Foam Pad  - 2 x daily - 7 x weekly - 1 sets - 3 reps - 30 sec  hold - Standing with Head Rotation  - 2 x daily - 7 x weekly - 1 sets - 3 reps - 20-30 sec  hold - Half Tandem Stance Balance with Eyes Closed  - 2 x daily - 7 x weekly - 1 sets - 3 reps - 2-30 sec  hold - Forward T with Counter Support  - 1 x daily - 7 x weekly - 1-3 sets - 10 reps ASSESSMENT:  CLINICAL IMPRESSION: Johnae reports no significant pain in the R hip or LB today. Reports increased muscular tightness through the Lt posterior cervical musculature. Good response to manual and dry needling. She is working on exercises at home. Karry continues to demonstrate potential for improvement and would benefit from continued skilled therapy to address impairments.    OBJECTIVE IMPAIRMENTS: pain in the neck and jaw as well as into the shoulder area Lt > Rt. Patient presents with poor posture and alignment; limited cervical mobility and ROM; discomfort with TMD opening and lateral deviation; muscular tightness to palpation through the ant/lat/posterior cervical musculature and jaw areas; weakness in postural musculature of upper core; pain with functional activities including chewing.decreased mobility, decreased ROM, decreased strength, hypomobility, increased fascial restrictions, increased muscle spasms, impaired flexibility, improper body mechanics, postural dysfunction, and pain.    GOALS: Goals reviewed with patient? Yes  SHORT TERM GOALS: Target date: 08/28/2022  Independent in initial HEP  Baseline:  Goal status: INITIAL  2.  Instruct patient in improved posture and alignment with functional activities and sitting/standing  Baseline:  Goal status: INITIAL   LONG TERM GOALS: Target date: 09/26/2022  Increase cervical mobility and ROM to WNL's throughout with decreased stiffness with ROM  Baseline:  Goal status:  INITIAL  2.  Improve posture and alignment with patient to demonstrate improved upright posture with posterior shoulder girdle musculature engaged  Baseline:  Goal status: INITIAL  3.  Improve TMD opening with patient to report ability chew foods of normal textures  Baseline:  Goal status: INITIAL  4.  Decrease pain in neck and jaw by 50-75% allowing patient to exercise and perform normal functional activities  Baseline:  Goal status: INITIAL  5.  Independent in HEP including aquatic program as indicated  Baseline:  Goal status: INITIAL  6.  Improve functional limitation score to 56  Baseline:  Goal status: INITIAL  7.  Pt will improve Rt ankle strength to 4+/5 to improve standing and walking tolerance Baseline:  Goal status: INITIAL  8.  Pt will improve Rt SLS to >= 15 seconds to demo improved balance Baseline:  Goal status: INITIAL     PLAN:  PT FREQUENCY: 2x/week  PT DURATION: 8 weeks  PLANNED INTERVENTIONS: Therapeutic exercises, Therapeutic activity, Neuromuscular re-education, Patient/Family education, Self Care, Joint mobilization, Aquatic Therapy, Dry Needling, Electrical stimulation, Spinal mobilization, Cryotherapy, Moist heat, Taping, Traction, Ultrasound, Ionotophoresis 4mg /ml Dexamethasone, Manual therapy, and Re-evaluation  PLAN FOR NEXT SESSION: DN prox R HS, eccentric strengthening; ankle and hip strength and balance, core strength. review and progress exercise; continue with postural correction and eduction; manual work, DN, modalities as indicated    [employer], PT 09/12/2022, 11:56 AM

## 2022-09-19 ENCOUNTER — Ambulatory Visit: Payer: PPO | Admitting: Rehabilitative and Restorative Service Providers"

## 2022-09-21 ENCOUNTER — Encounter: Payer: PPO | Admitting: Rehabilitative and Restorative Service Providers"

## 2022-09-26 ENCOUNTER — Other Ambulatory Visit: Payer: Self-pay | Admitting: Family Medicine

## 2022-09-26 DIAGNOSIS — F419 Anxiety disorder, unspecified: Secondary | ICD-10-CM

## 2022-09-27 ENCOUNTER — Encounter: Payer: Self-pay | Admitting: Family Medicine

## 2022-09-27 ENCOUNTER — Other Ambulatory Visit: Payer: Self-pay | Admitting: *Deleted

## 2022-09-27 DIAGNOSIS — E611 Iron deficiency: Secondary | ICD-10-CM

## 2022-09-28 ENCOUNTER — Ambulatory Visit: Payer: PPO | Admitting: Rehabilitative and Restorative Service Providers"

## 2022-09-29 ENCOUNTER — Ambulatory Visit: Payer: PPO | Admitting: Sports Medicine

## 2022-10-02 ENCOUNTER — Encounter: Payer: Self-pay | Admitting: *Deleted

## 2022-10-04 ENCOUNTER — Encounter: Payer: Self-pay | Admitting: Family Medicine

## 2022-10-04 ENCOUNTER — Encounter: Payer: Self-pay | Admitting: Rehabilitative and Restorative Service Providers"

## 2022-10-04 ENCOUNTER — Ambulatory Visit: Payer: PPO | Attending: Sports Medicine | Admitting: Rehabilitative and Restorative Service Providers"

## 2022-10-04 DIAGNOSIS — M539 Dorsopathy, unspecified: Secondary | ICD-10-CM | POA: Insufficient documentation

## 2022-10-04 DIAGNOSIS — R29898 Other symptoms and signs involving the musculoskeletal system: Secondary | ICD-10-CM | POA: Insufficient documentation

## 2022-10-04 DIAGNOSIS — M6281 Muscle weakness (generalized): Secondary | ICD-10-CM | POA: Insufficient documentation

## 2022-10-04 DIAGNOSIS — M26609 Unspecified temporomandibular joint disorder, unspecified side: Secondary | ICD-10-CM | POA: Diagnosis present

## 2022-10-04 DIAGNOSIS — E611 Iron deficiency: Secondary | ICD-10-CM | POA: Diagnosis not present

## 2022-10-04 NOTE — Therapy (Signed)
OUTPATIENT PHYSICAL THERAPY  TREATMENT   Patient Name: Sheila Harris MRN: [id number] DOB:Sep 18, 1955, 67 y.o., female Today's Date: 10/04/2022  END OF SESSION:  PT End of Session - 10/04/22 0936     Visit Number 12    Number of Visits 16    Authorization Type [employer] $15 copay; $3200 max    PT Start Time 0934    PT Stop Time 1022    PT Time Calculation (min) 48 min    Activity Tolerance Patient tolerated treatment well              Past Medical History:  Diagnosis Date   Genital herpes    Macular degeneration    Thyroid disease    History reviewed. No pertinent surgical history. Patient Active Problem List   Diagnosis Date Noted   Metatarsalgia of right foot 03/06/2022   Iron deficiency 02/23/2022   Dry age-related macular degeneration 12/08/2021   Chronic cough 08/11/2021   Chronic foot pain, right 07/12/2021   Left shoulder pain 05/05/2021   Hormone replacement therapy (HRT) 07/08/2020   Corn of foot right, 4/5 interspace 06/07/2020   Recurrent low back pain 11/19/2019   Herpes infection 11/19/2019   Osteoporosis 11/19/2019   Factor V Leiden 11/19/2019   Hypothyroidism 11/19/2019   Anxiety 11/19/2019   Trigger finger of right hand 06/07/2016   Carpal tunnel syndrome, right 02/22/2016   Primary osteoarthritis of right hand 12/30/2015   Right cervical radiculopathy 12/30/2015   Trigger finger of left hand 10/08/2015   Pes cavus 11/21/2013   Right lumbar radiculopathy 11/21/2013    PCP: Charlann Boxer  REFERRING PROVIDER: Dr Rodney Langton,  Shirlean Kelly, DPM   REFERRING DIAG: Cervical Radiculopathy, Lumbar radiculopathy  THERAPY DIAG:  Other symptoms and signs involving the musculoskeletal system  Muscle weakness (generalized)  Cervical dysfunction  TMJ dysfunction  Rationale for Evaluation and Treatment: Rehabilitation  ONSET DATE: 06/02/22  SUBJECTIVE:                                                                                                                                                                                                          SUBJECTIVE STATEMENT: Doing okay - headaches are better still has some tightness in the Lt side of he neck. Working on the internal massage for the TMJ and also continuing to work on exercises at home. Back has calmed down now. Having some pain in the Lt rib area including the chest to side to mid back. Scheduled for dental procedure next week.    PERTINENT  HISTORY:  neck pain on the off in the past 3-4 months but she as had pain in the neck over the past several years. She has had some pain in the Rt TMJ over the years as well. She has an overbite and jaw is tilted. She has seen a chiropractor in the past. She now has difficulty chewing and has not chewed in the past 2 weeks Chronic pain in the TMJ and neck; costochondritis; arthritis; osteoarthritis   Patient has been seeing podiatry for about 1 year. She had a shot for mortons neuroma which did not help. She also had a stress fracture of Rt 4th metatarsal and wore a boot and had physical therapy. She has also had back issues for "decades" on and off and now MD and DPM believe foot pain may be coming from her back. 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 Pt states pain only "occasionally" goes down her leg, mostly pain is just in her Rt foot and toes and in her back. Pain in back and foot increases with prolonged standing and walking (20-30 minutes)  PAIN:  Are you having pain? Yes: NPRS scale: 5/10 Pain location: thoracic spine/ ribs Pain description: ache Aggravating factors: walking more than a mile Relieving factors: stretches   PRECAUTIONS: None  WEIGHT BEARING RESTRICTIONS: No  FALLS:  Has patient fallen in last 6 months? No   OCCUPATION: retired - household chores; walking 30-60 min 6 days/wk   PATIENT GOALS: strengthen areas to help decrease pain    NEXT MD VISIT: None scheduled  OBJECTIVE:   DIAGNOSTIC FINDINGS:  Xray 05/10/22: No fracture, dislocation or subluxation. No spondylolisthesis. No osteolytic or osteoblastic changes. Prevertebral and cervical cranial soft tissues are unremarkable. Degenerative disc disease noted with disc space narrowing and marginal osteophytes at C5-6 through C7-T1.  Lumbar MRI shows mild degenerative changes  PATIENT SURVEYS:  FOTO 47 goal 56  POSTURE:  Pt stands with Rt foot in supination Pt reports some symptom relief in foot with trunk extension  Patient presents with head forward posture with increased thoracic kyphosis; shoulders rounded and elevated; scapulae abducted and rotated along the thoracic spine; head of the humerus anterior in orientation.  PALPATION: Lumbar jt mobility WFL for CPAs and UPAs, no TTP lumbar or hip musculature  Muscular tightness ant/lat/posterior cervical musculature; TMD/jaw area Lt > Rt with tenderness into the Lt temple and scalp  10/04/22: persistent muscular tightness ant/lat/posterior cervical musculature; TMD/jaw area Lt > Rt with tenderness into the Lt temple and scalp  CERVICAL ROM: tight end ranges   Active ROM AROM (deg) eval AROM  10/04/22  Flexion 46 47  Extension 45 52  Right lateral flexion 27 30  Left lateral flexion 39 40  Right rotation 60 40  Left rotation 49 40   (Blank rows = not tested)   JAW: TMD opening and lateral deviation limited   10/04/22: Good progress with increasing mobility/ROM in opening and lateral deviation   LOWER EXTREMITY MMT:    MMT Right eval Left eval  Hip flexion 4+ 4+  Hip extension    Hip abduction 4+ 4+  Hip adduction    Hip internal rotation    Hip external rotation    Knee flexion    Knee extension    Ankle dorsiflexion 3   Ankle plantarflexion 3   Ankle inversion 3   Ankle eversion 3    (Blank rows = not tested)  LUMBAR ROM:  Active  A/PROM  eval  Flexion Limited 25%  Extension WFL   Right lateral flexion Limited 50%  Left lateral flexion Limited 50%  Right rotation Limited 50%  Left rotation Limited 50%   (Blank rows = not tested)  SLS Rt: 1 second  10/04/22: SLS Rt 8 sec; Lt 15 sec                                                                                                                               [facility] Adult PT Treatment:                                                 DATE: 10/04/22: Headache/TMJ UBE L4 x 4 min 2 min fwd/2 min back   Doorway stretch 3 positions 30 sec x 2  Scap squeeze with noodle 5 sec x 10  L's with yellow TB x 20  W's yellow TB x 20 Wall angel x 20 prone scap squeeze with axial extension 5 sec x 5  Manual:  Soft tissue mobilization; deep tissue work through the ant/lat/post cervical musculature; pecs; upper trap; scalp  Trigger Point Dry-Needling  Treatment instructions: Expect mild to moderate muscle soreness. S/S of pneumothorax if dry needled over a lung field, and to seek immediate medical attention should they occur. Patient verbalized understanding of these instructions and education.  Patient Consent Given: Yes Education handout provided: Previously provided Muscles treated: bilat suboccipitals; SCM; cervical paraspinals; acupuncture point at medial brow line   Electrical stimulation performed: No Parameters:  Treatment response/outcome:   Modalities Moist heat cervical and thoracic area x 10 min   [facility] Adult PT Treatment:                                                 DATE: 09/12/22: Headache/TMJ Nustep L5 x 5 min  Doorway stretch 3 positions 30 sec x 2  Scap squeeze with noodle 5 sec x 10  L's with yellow TB x 20  W's yellow TB x 20 Wall angel x 20 prone scap squeeze with axial extension 5 sec x 5  Manual:  Soft tissue mobilization; deep tissue work through the ant/lat/post cervical musculature; pecs; upper trap; scalp  Trigger Point Dry-Needling  Treatment instructions: Expect mild to moderate muscle  soreness. S/S of pneumothorax if dry needled over a lung field, and to seek immediate medical attention should they occur. Patient verbalized understanding of these instructions and education.  Patient Consent Given: Yes Education handout provided: Previously provided Muscles treated: bilat suboccipitals; SCM; cervical paraspinals; acupuncture point at medial brow line   Electrical stimulation performed: No Parameters:  Treatment response/outcome:   Modalities Moist heat  cervical and thoracic area x 10 min   PATIENT EDUCATION:  Education details: POC; HEP  Person educated: Patient Education method: Explanation, Demonstration, Tactile cues, Verbal cues, and Handouts Education comprehension: verbalized understanding, returned demonstration, verbal cues required, tactile cues required, and needs further education  HOME EXERCISE PROGRAM: Access Code: [email or web address] URL: [email or web address] Date: 09/07/2022 Prepared by: Raynelle Fanning  Exercises - Supine Cervical Retraction with Towel  - 2 x daily - 7 x weekly - 1 sets - 5-10 reps - 10 sec  hold - Standing Cervical Retraction  - 2 x daily - 7 x weekly - 1 sets - 10 reps - 5-10 sec  hold - Seated Scapular Retraction  - 2 x daily - 7 x weekly - 1-2 sets - 10 reps - 10 sec  hold - Shoulder External Rotation and Scapular Retraction  - 3 x daily - 7 x weekly - 1 sets - 10 reps - 3-5 sec   hold - Shoulder External Rotation in 45 Degrees Abduction  - 2 x daily - 7 x weekly - 1-2 sets - 10 reps - 3 sec  hold - Shoulder External Rotation and Scapular Retraction with Resistance  - 2 x daily - 7 x weekly - 1 sets - 10 reps - 3-5 sec  hold - Shoulder W - External Rotation with Resistance  - 2 x daily - 7 x weekly - 1-2 sets - 10 reps - 3 sec  hold - Supine Scapular Retraction  - 2 x daily - 7 x weekly - 1 sets - 10 reps - 5-10 sec  hold - Standing Bilateral Low Shoulder Row with Anchored Resistance  - 2 x daily - 7 x weekly - 1-3 sets - 10 reps -  2-3 sec  hold - Drawing Bow  - 1 x daily - 7 x weekly - 1 sets - 10 reps - 3 sec  hold - Standing High Row with Resistance  - 1 x daily - 7 x weekly - 1 sets - 10 reps - 3-5 sec  hold - Cobra  - 1 x daily - 7 x weekly - 1 sets - 10 reps - 10 seconds hold - Heel Raises with Counter Support  - 1 x daily - 7 x weekly - 3 sets - 10 reps - Heel Toe Raises with Counter Support  - 1 x daily - 7 x weekly - 3 sets - 10 reps - Seated Ankle Eversion with Resistance  - 1 x daily - 7 x weekly - 3 sets - 10 reps - Seated Ankle Inversion with Resistance  - 1 x daily - 7 x weekly - 3 sets - 10 reps - Toe Yoga - Alternating Great Toe and Lesser Toe Extension  - 1 x daily - 7 x weekly - 3 sets - 10 reps - Anti-Rotation Lateral Stepping with Press  - 1 x daily - 7 x weekly - 1-2 sets - 10 reps - 2-3 sec  hold - Single Leg Stance  - 1 x daily - 7 x weekly - 2 sets - 5 reps - 10-20 sec hold - Seated Hip Internal Rotation with Ball and Resistance  - 2 x daily - 7 x weekly - 1 sets - 10 reps - 3 sec  hold - Sidelying Reverse Clamshell  - 1 x daily - 7 x weekly - 3 sets - 10 reps - Clamshell  - 1 x daily - 7 x weekly - 3 sets - 10 reps - Sidelying  Diagonal Hip Abduction  - 1 x daily - 7 x weekly - 3 sets - 10 reps - Marching with Resistance  - 1 x daily - 7 x weekly - 3 sets - 10 reps - Step Up  - 1 x daily - 7 x weekly - 2-3 sets - 10 reps - 2 sec  hold - Standing Hip Extension with Resistance at Ankles and Unilateral Counter Support  - 1 x daily - 7 x weekly - 2-3 sets - 10 reps - 3 sec  hold - Standing Hip Abduction with Resistance at Ankles and Counter Support  - 1 x daily - 7 x weekly - 2-3 sets - 10 reps - 3 sec  hold - Side Stepping with Resistance at Feet  - 1 x daily - 7 x weekly - Mini Squat with Counter Support  - 2 x daily - 7 x weekly - 1-2 sets - 10 reps - 3 sec  hold - The Diver  - 2 x daily - 7 x weekly - 1 sets - 10 reps - 2-3 sec  hold - Single Leg Balance with Eyes Closed  - 2 x daily - 7 x weekly -  1 sets - 3 reps - 20-30 sec  hold - Romberg Stance Eyes Closed on Foam Pad  - 2 x daily - 7 x weekly - 1 sets - 3 reps - 30 sec  hold - Standing with Head Rotation  - 2 x daily - 7 x weekly - 1 sets - 3 reps - 20-30 sec  hold - Half Tandem Stance Balance with Eyes Closed  - 2 x daily - 7 x weekly - 1 sets - 3 reps - 2-30 sec  hold - Forward T with Counter Support  - 1 x daily - 7 x weekly - 1-3 sets - 10 reps ASSESSMENT:  CLINICAL IMPRESSION: Shawnese reports no significant pain in the R hip or LB; some tightness continues in the Lt cervical area and reports tightness and discomfort in the Lt thoracic area including the chest/pecs and thoracic spine area. She has increased muscular tightness through the Lt posterior cervical musculature and Lt thoracic area. Good response to manual and dry needling. She is working on exercises at home. Varvara continues to demonstrate potential for improvement and would benefit from continued skilled therapy to address impairments. Goals are partially accomplished. Patient will benefit from continued treatment to achieve goals.    OBJECTIVE IMPAIRMENTS: pain in the neck and jaw as well as into the shoulder area Lt > Rt. Patient presents with poor posture and alignment; limited cervical mobility and ROM; discomfort with TMD opening and lateral deviation; muscular tightness to palpation through the ant/lat/posterior cervical musculature and jaw areas; weakness in postural musculature of upper core; pain with functional activities including chewing.decreased mobility, decreased ROM, decreased strength, hypomobility, increased fascial restrictions, increased muscle spasms, impaired flexibility, improper body mechanics, postural dysfunction, and pain.    GOALS: Goals reviewed with patient? Yes  SHORT TERM GOALS: Target date: 08/28/2022  Independent in initial HEP  Baseline:  Goal status: met  2.  Instruct patient in improved posture and alignment with functional  activities and sitting/standing  Baseline:  Goal status: met   LONG TERM GOALS: Target date: 11/29/2022  Increase cervical mobility and ROM to WNL's throughout with decreased stiffness with ROM  Baseline:  Goal status: on going   2.  Improve posture and alignment with patient to demonstrate improved upright posture with posterior shoulder girdle  musculature engaged  Baseline:  Goal status: partially met  3.  Improve TMD opening with patient to report ability chew foods of normal textures  Baseline:  Goal status: on going   4.  Decrease pain in neck and jaw by 50-75% allowing patient to exercise and perform normal functional activities  Baseline:  Goal status: on going   5.  Independent in HEP including aquatic program as indicated  Baseline:  Goal status: met  6.  Improve functional limitation score to 56  Baseline:  Goal status: INITIAL  7.  Pt will improve Rt ankle strength to 4+/5 to improve standing and walking tolerance Baseline:  Goal status: met  8.  Pt will improve Rt SLS to >= 15 seconds to demo improved balance Baseline:  Goal status: on going      PLAN:  PT FREQUENCY: 2x/week  PT DURATION: 8 weeks  PLANNED INTERVENTIONS: Therapeutic exercises, Therapeutic activity, Neuromuscular re-education, Patient/Family education, Self Care, Joint mobilization, Aquatic Therapy, Dry Needling, Electrical stimulation, Spinal mobilization, Cryotherapy, Moist heat, Taping, Traction, Ultrasound, Ionotophoresis 4mg /ml Dexamethasone, Manual therapy, and Re-evaluation  PLAN FOR NEXT SESSION: DN prox R HS, eccentric strengthening; ankle and hip strength and balance, core strength. review and progress exercise; continue with postural correction and eduction; manual work, DN, modalities as indicated    [employer], PT 10/04/2022, 10:16 AM

## 2022-10-05 ENCOUNTER — Ambulatory Visit: Payer: PPO | Admitting: Sports Medicine

## 2022-10-05 LAB — FERRITIN: Ferritin: 16 ng/mL (ref 16–288)

## 2022-10-05 NOTE — Telephone Encounter (Signed)
It should last but can redose after 2 hours if needed.

## 2022-10-05 NOTE — Progress Notes (Signed)
Hi Sheila Harris, your iron is looking better.  Still a ways to go.  I really want to get it above 40.  But you are gradually moving in the right direction so please continue with your supplement.

## 2022-10-09 ENCOUNTER — Ambulatory Visit (INDEPENDENT_AMBULATORY_CARE_PROVIDER_SITE_OTHER): Payer: PPO | Admitting: Sports Medicine

## 2022-10-09 DIAGNOSIS — M5030 Other cervical disc degeneration, unspecified cervical region: Secondary | ICD-10-CM

## 2022-10-09 DIAGNOSIS — M542 Cervicalgia: Secondary | ICD-10-CM

## 2022-10-09 NOTE — Progress Notes (Signed)
    Procedures performed today:    None.  Independent interpretation of notes and tests performed by another provider:   None.  Brief History, Exam, Impression, and Recommendations:    Degenerative disc disease, cervical Known cervical DDD on x-rays, at this point she has failed greater than 6 weeks of formal PT, medications. Proceeding with cervical spine MRI, she can follow-up with me for injection planning.    ____________________________________________ Ihor Austin. Benjamin Stain, M.D., ABFM., CAQSM., AME. Primary Care and Sports Medicine [facility]  Adjunct Professor of Family Medicine  [employer]  Restaurant manager, fast food

## 2022-10-09 NOTE — Assessment & Plan Note (Signed)
Known cervical DDD on x-rays, at this point she has failed greater than 6 weeks of formal PT, medications. Proceeding with cervical spine MRI, she can follow-up with me for injection planning.

## 2022-10-11 ENCOUNTER — Ambulatory Visit: Payer: PPO | Admitting: Rehabilitative and Restorative Service Providers"

## 2022-10-11 ENCOUNTER — Encounter: Payer: Self-pay | Admitting: Rehabilitative and Restorative Service Providers"

## 2022-10-11 DIAGNOSIS — R29898 Other symptoms and signs involving the musculoskeletal system: Secondary | ICD-10-CM | POA: Diagnosis not present

## 2022-10-11 DIAGNOSIS — M539 Dorsopathy, unspecified: Secondary | ICD-10-CM

## 2022-10-11 DIAGNOSIS — M6281 Muscle weakness (generalized): Secondary | ICD-10-CM

## 2022-10-11 NOTE — Therapy (Signed)
OUTPATIENT PHYSICAL THERAPY  TREATMENT   Patient Name: Sheila Harris MRN: [id number] DOB:12/30/1955, 67 y.o., female Today's Date: 10/11/2022  END OF SESSION:  PT End of Session - 10/11/22 1020     Visit Number 13    Number of Visits 28    Date for PT Re-Evaluation 11/29/22    Authorization Type [employer] $15 copay; $3200 max    PT Start Time 1018    PT Stop Time 1103    PT Time Calculation (min) 45 min              Past Medical History:  Diagnosis Date   Genital herpes    Macular degeneration    Thyroid disease    History reviewed. No pertinent surgical history. Patient Active Problem List   Diagnosis Date Noted   Metatarsalgia of right foot 03/06/2022   Iron deficiency 02/23/2022   Dry age-related macular degeneration 12/08/2021   Chronic cough 08/11/2021   Chronic foot pain, right 07/12/2021   Left shoulder pain 05/05/2021   Hormone replacement therapy (HRT) 07/08/2020   Corn of foot right, 4/5 interspace 06/07/2020   Herpes infection 11/19/2019   Osteoporosis 11/19/2019   Factor V Leiden 11/19/2019   Hypothyroidism 11/19/2019   Anxiety 11/19/2019   Trigger finger of right hand 06/07/2016   Carpal tunnel syndrome, right 02/22/2016   Primary osteoarthritis of right hand 12/30/2015   Degenerative disc disease, cervical 12/30/2015   Trigger finger of left hand 10/08/2015   Pes cavus 11/21/2013   Right lumbar radiculopathy 11/21/2013    PCP: Charlann Boxer  REFERRING PROVIDER: Dr Rodney Langton,  Shirlean Kelly, DPM   REFERRING DIAG: Cervical Radiculopathy, Lumbar radiculopathy  THERAPY DIAG:  Other symptoms and signs involving the musculoskeletal system  Muscle weakness (generalized)  Cervical dysfunction  Rationale for Evaluation and Treatment: Rehabilitation  ONSET DATE: 06/02/22  SUBJECTIVE:                                                                                                                                                                                                          SUBJECTIVE STATEMENT: Having surgery tomorrow and wants to take it easy today. Doing okay - headaches are better still has some tightness in the Lt side of he neck. Back has calmed down now. Having some pain in the Lt rib area including the chest to side to mid back. Scheduled for dental procedure tomorrow.    PERTINENT HISTORY:  neck pain on the off in the past 3-4 months but she as had pain in  the neck over the past several years. She has had some pain in the Rt TMJ over the years as well. She has an overbite and jaw is tilted. She has seen a chiropractor in the past. She now has difficulty chewing and has not chewed in the past 2 weeks Chronic pain in the TMJ and neck; costochondritis; arthritis; osteoarthritis   Patient has been seeing podiatry for about 1 year. She had a shot for mortons neuroma which did not help. She also had a stress fracture of Rt 4th metatarsal and wore a boot and had physical therapy. She has also had back issues for "decades" on and off and now MD and DPM believe foot pain may be coming from her back. 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 Pt states pain only "occasionally" goes down her leg, mostly pain is just in her Rt foot and toes and in her back. Pain in back and foot increases with prolonged standing and walking (20-30 minutes)  PAIN:  Are you having pain? Yes: NPRS scale: 3-5/10 Pain location: thoracic spine/ ribs Pain description: ache Aggravating factors: walking more than a mile Relieving factors: stretches   PRECAUTIONS: None  WEIGHT BEARING RESTRICTIONS: No  FALLS:  Has patient fallen in last 6 months? No   OCCUPATION: retired - household chores; walking 30-60 min 6 days/wk   PATIENT GOALS: strengthen areas to help decrease pain   NEXT MD VISIT: None scheduled  OBJECTIVE:   DIAGNOSTIC FINDINGS:  Xray 05/10/22: No fracture,  dislocation or subluxation. No spondylolisthesis. No osteolytic or osteoblastic changes. Prevertebral and cervical cranial soft tissues are unremarkable. Degenerative disc disease noted with disc space narrowing and marginal osteophytes at C5-6 through C7-T1.  Lumbar MRI shows mild degenerative changes  PATIENT SURVEYS:  FOTO 47 goal 56  POSTURE:  Pt stands with Rt foot in supination Pt reports some symptom relief in foot with trunk extension  Patient presents with head forward posture with increased thoracic kyphosis; shoulders rounded and elevated; scapulae abducted and rotated along the thoracic spine; head of the humerus anterior in orientation.  PALPATION: Lumbar jt mobility WFL for CPAs and UPAs, no TTP lumbar or hip musculature  Muscular tightness ant/lat/posterior cervical musculature; TMD/jaw area Lt > Rt with tenderness into the Lt temple and scalp  10/04/22: persistent muscular tightness ant/lat/posterior cervical musculature; TMD/jaw area Lt > Rt with tenderness into the Lt temple and scalp  CERVICAL ROM: tight end ranges   Active ROM AROM (deg) eval AROM  10/04/22  Flexion 46 47  Extension 45 52  Right lateral flexion 27 30  Left lateral flexion 39 40  Right rotation 60 40  Left rotation 49 40   (Blank rows = not tested)   JAW: TMD opening and lateral deviation limited   10/04/22: Good progress with increasing mobility/ROM in opening and lateral deviation   LOWER EXTREMITY MMT:    MMT Right eval Left eval  Hip flexion 4+ 4+  Hip extension    Hip abduction 4+ 4+  Hip adduction    Hip internal rotation    Hip external rotation    Knee flexion    Knee extension    Ankle dorsiflexion 3   Ankle plantarflexion 3   Ankle inversion 3   Ankle eversion 3    (Blank rows = not tested)  LUMBAR ROM:   Active  A/PROM  eval  Flexion Limited 25%  Extension WFL  Right lateral flexion Limited  50%  Left lateral flexion Limited 50%  Right rotation Limited 50%   Left rotation Limited 50%   (Blank rows = not tested)  SLS Rt: 1 second  10/04/22: SLS Rt 8 sec; Lt 15 sec                                                                                                                               [facility] Adult PT Treatment:                                                 DATE: 10/11/22: Headache/TMJ UBE L4 x 4 min 2 min fwd/2 min back   Doorway stretch 3 positions 30 sec x 2  Scap squeeze with noodle 5 sec x 10  L's with yellow TB x 20  W's yellow TB x 20 Wall angel x 20 prone scap squeeze with axial extension 5 sec x 5  Lat stretch supine 30 sec x 3  Shoulder extension green TB 3 sec x 10  Lat pull Green TB 3 sec x 10 x 2   Manual:  Soft tissue mobilization; deep tissue work through the ant/lat/post cervical musculature; pecs; upper trap; scalp  Working through the Lt posterior shoulder girdle area into lats/teres  Modalities Moist heat cervical and thoracic area x 7 min  [facility] Adult PT Treatment:                                                 DATE: 10/04/22: Headache/TMJ UBE L4 x 4 min 2 min fwd/2 min back   Doorway stretch 3 positions 30 sec x 2  Scap squeeze with noodle 5 sec x 10  L's with yellow TB x 20  W's yellow TB x 20 Wall angel x 20 prone scap squeeze with axial extension 5 sec x 5  Manual:  Soft tissue mobilization; deep tissue work through the ant/lat/post cervical musculature; pecs; upper trap; scalp  Trigger Point Dry-Needling  Treatment instructions: Expect mild to moderate muscle soreness. S/S of pneumothorax if dry needled over a lung field, and to seek immediate medical attention should they occur. Patient verbalized understanding of these instructions and education.  Patient Consent Given: Yes Education handout provided: Previously provided Muscles treated: bilat suboccipitals; SCM; cervical paraspinals; acupuncture point at medial brow line   Electrical stimulation performed: No Parameters:  Treatment  response/outcome:   Modalities Moist heat cervical and thoracic area x 10 min   [facility] Adult PT Treatment:  DATE: 09/12/22: Headache/TMJ Doorway stretch 3 positions 30 sec x 2  Chin tuck 5 sec x 10 standing  Scap squeeze with noodle 5 sec x 10  L's with yellow TB x 20  W's yellow TB x 20 Wall angel x 20 prone scap squeeze with axial extension 5 sec x 5   Manual:  Soft tissue mobilization; deep tissue work through the ant/lat/post cervical musculature; pecs; upper trap; scalp  Trigger Point Dry-Needling  Treatment instructions: Expect mild to moderate muscle soreness. S/S of pneumothorax if dry needled over a lung field, and to seek immediate medical attention should they occur. Patient verbalized understanding of these instructions and education.  Patient Consent Given: Yes Education handout provided: Previously provided Muscles treated: bilat suboccipitals; SCM; cervical paraspinals; acupuncture point at medial brow line   Electrical stimulation performed: No Parameters:  Treatment response/outcome:   Modalities Moist heat cervical and thoracic area x 10 min   PATIENT EDUCATION:  Education details: POC; HEP  Person educated: Patient Education method: Programmer, multimedia, Demonstration, Tactile cues, Verbal cues, and Handouts Education comprehension: verbalized understanding, returned demonstration, verbal cues required, tactile cues required, and needs further education  HOME EXERCISE PROGRAM: Access Code: [email or web address] URL: [email or web address] Date: 10/11/2022 Prepared by: Corlis Leak  Exercises - Supine Cervical Retraction with Towel  - 2 x daily - 7 x weekly - 1 sets - 5-10 reps - 10 sec  hold - Standing Cervical Retraction  - 2 x daily - 7 x weekly - 1 sets - 10 reps - 5-10 sec  hold - Seated Scapular Retraction  - 2 x daily - 7 x weekly - 1-2 sets - 10 reps - 10 sec  hold - Shoulder External Rotation and Scapular  Retraction  - 3 x daily - 7 x weekly - 1 sets - 10 reps - 3-5 sec   hold - Shoulder External Rotation in 45 Degrees Abduction  - 2 x daily - 7 x weekly - 1-2 sets - 10 reps - 3 sec  hold - Shoulder External Rotation and Scapular Retraction with Resistance  - 2 x daily - 7 x weekly - 1 sets - 10 reps - 3-5 sec  hold - Shoulder W - External Rotation with Resistance  - 2 x daily - 7 x weekly - 1-2 sets - 10 reps - 3 sec  hold - Supine Scapular Retraction  - 2 x daily - 7 x weekly - 1 sets - 10 reps - 5-10 sec  hold - Standing Bilateral Low Shoulder Row with Anchored Resistance  - 2 x daily - 7 x weekly - 1-3 sets - 10 reps - 2-3 sec  hold - Drawing Bow  - 1 x daily - 7 x weekly - 1 sets - 10 reps - 3 sec  hold - Standing High Row with Resistance  - 1 x daily - 7 x weekly - 1 sets - 10 reps - 3-5 sec  hold - Cobra  - 1 x daily - 7 x weekly - 1 sets - 10 reps - 10 seconds hold - Heel Raises with Counter Support  - 1 x daily - 7 x weekly - 3 sets - 10 reps - Heel Toe Raises with Counter Support  - 1 x daily - 7 x weekly - 3 sets - 10 reps - Seated Ankle Eversion with Resistance  - 1 x daily - 7 x weekly - 3 sets - 10 reps - Seated Ankle Inversion with Resistance  - 1 x  daily - 7 x weekly - 3 sets - 10 reps - Toe Yoga - Alternating Great Toe and Lesser Toe Extension  - 1 x daily - 7 x weekly - 3 sets - 10 reps - Anti-Rotation Lateral Stepping with Press  - 1 x daily - 7 x weekly - 1-2 sets - 10 reps - 2-3 sec  hold - Single Leg Stance  - 1 x daily - 7 x weekly - 2 sets - 5 reps - 10-20 sec hold - Seated Hip Internal Rotation with Ball and Resistance  - 2 x daily - 7 x weekly - 1 sets - 10 reps - 3 sec  hold - Sidelying Reverse Clamshell  - 1 x daily - 7 x weekly - 3 sets - 10 reps - Clamshell  - 1 x daily - 7 x weekly - 3 sets - 10 reps - Sidelying Diagonal Hip Abduction  - 1 x daily - 7 x weekly - 3 sets - 10 reps - Marching with Resistance  - 1 x daily - 7 x weekly - 3 sets - 10 reps - Step Up  - 1 x  daily - 7 x weekly - 2-3 sets - 10 reps - 2 sec  hold - Standing Hip Extension with Resistance at Ankles and Unilateral Counter Support  - 1 x daily - 7 x weekly - 2-3 sets - 10 reps - 3 sec  hold - Standing Hip Abduction with Resistance at Ankles and Counter Support  - 1 x daily - 7 x weekly - 2-3 sets - 10 reps - 3 sec  hold - Side Stepping with Resistance at Feet  - 1 x daily - 7 x weekly - Mini Squat with Counter Support  - 2 x daily - 7 x weekly - 1-2 sets - 10 reps - 3 sec  hold - The Diver  - 2 x daily - 7 x weekly - 1 sets - 10 reps - 2-3 sec  hold - Single Leg Balance with Eyes Closed  - 2 x daily - 7 x weekly - 1 sets - 3 reps - 20-30 sec  hold - Romberg Stance Eyes Closed on Foam Pad  - 2 x daily - 7 x weekly - 1 sets - 3 reps - 30 sec  hold - Standing with Head Rotation  - 2 x daily - 7 x weekly - 1 sets - 3 reps - 20-30 sec  hold - Half Tandem Stance Balance with Eyes Closed  - 2 x daily - 7 x weekly - 1 sets - 3 reps - 2-30 sec  hold - Forward T with Counter Support  - 1 x daily - 7 x weekly - 1-3 sets - 10 reps - Standing Lat Pull Down with Resistance - Elbows Bent  - 2 x daily - 7 x weekly - 1 sets - 10 reps - 3 sec  hold  ASSESSMENT:  CLINICAL IMPRESSION: Semaya reports no significant pain in the R hip or LB; some tightness continues in the Lt cervical area and reports tightness and discomfort in the Lt thoracic area including the chest/pecs and thoracic spine area. She has increased muscular tightness through the Lt posterior cervical musculature and Lt thoracic area as well as posterior inferior scapula - lats and teres. Good response to manual work. Added lat stretch supine and lat pull. She is working on exercises at home.   OBJECTIVE IMPAIRMENTS: pain in the neck and jaw as well as into  the shoulder area Lt > Rt. Patient presents with poor posture and alignment; limited cervical mobility and ROM; discomfort with TMD opening and lateral deviation; muscular tightness to  palpation through the ant/lat/posterior cervical musculature and jaw areas; weakness in postural musculature of upper core; pain with functional activities including chewing.decreased mobility, decreased ROM, decreased strength, hypomobility, increased fascial restrictions, increased muscle spasms, impaired flexibility, improper body mechanics, postural dysfunction, and pain.    GOALS: Goals reviewed with patient? Yes  SHORT TERM GOALS: Target date: 08/28/2022  Independent in initial HEP  Baseline:  Goal status: met  2.  Instruct patient in improved posture and alignment with functional activities and sitting/standing  Baseline:  Goal status: met   LONG TERM GOALS: Target date: 11/29/2022  Increase cervical mobility and ROM to WNL's throughout with decreased stiffness with ROM  Baseline:  Goal status: on going   2.  Improve posture and alignment with patient to demonstrate improved upright posture with posterior shoulder girdle musculature engaged  Baseline:  Goal status: partially met  3.  Improve TMD opening with patient to report ability chew foods of normal textures  Baseline:  Goal status: on going   4.  Decrease pain in neck and jaw by 50-75% allowing patient to exercise and perform normal functional activities  Baseline:  Goal status: on going   5.  Independent in HEP including aquatic program as indicated  Baseline:  Goal status: met  6.  Improve functional limitation score to 56  Baseline:  Goal status: INITIAL  7.  Pt will improve Rt ankle strength to 4+/5 to improve standing and walking tolerance Baseline:  Goal status: met  8.  Pt will improve Rt SLS to >= 15 seconds to demo improved balance Baseline:  Goal status: on going      PLAN:  PT FREQUENCY: 2x/week  PT DURATION: 8 weeks  PLANNED INTERVENTIONS: Therapeutic exercises, Therapeutic activity, Neuromuscular re-education, Patient/Family education, Self Care, Joint mobilization, Aquatic Therapy,  Dry Needling, Electrical stimulation, Spinal mobilization, Cryotherapy, Moist heat, Taping, Traction, Ultrasound, Ionotophoresis 4mg /ml Dexamethasone, Manual therapy, and Re-evaluation  PLAN FOR NEXT SESSION: DN prox R HS, eccentric strengthening; ankle and hip strength and balance, core strength. review and progress exercise; continue with postural correction and eduction; manual work, DN, modalities as indicated    [employer], PT 10/11/2022, 10:21 AM

## 2022-10-12 ENCOUNTER — Other Ambulatory Visit: Payer: Self-pay | Admitting: Family Medicine

## 2022-10-16 ENCOUNTER — Encounter: Payer: Self-pay | Admitting: Sports Medicine

## 2022-10-18 ENCOUNTER — Ambulatory Visit: Payer: PPO | Admitting: Rehabilitative and Restorative Service Providers"

## 2022-10-19 ENCOUNTER — Encounter: Payer: Self-pay | Admitting: Rehabilitative and Restorative Service Providers"

## 2022-10-19 ENCOUNTER — Ambulatory Visit: Payer: PPO | Attending: Sports Medicine | Admitting: Rehabilitative and Restorative Service Providers"

## 2022-10-19 DIAGNOSIS — M6281 Muscle weakness (generalized): Secondary | ICD-10-CM

## 2022-10-19 DIAGNOSIS — M26609 Unspecified temporomandibular joint disorder, unspecified side: Secondary | ICD-10-CM | POA: Diagnosis present

## 2022-10-19 DIAGNOSIS — R29898 Other symptoms and signs involving the musculoskeletal system: Secondary | ICD-10-CM | POA: Diagnosis present

## 2022-10-19 DIAGNOSIS — M539 Dorsopathy, unspecified: Secondary | ICD-10-CM

## 2022-10-19 NOTE — Therapy (Signed)
OUTPATIENT PHYSICAL THERAPY  TREATMENT   Patient Name: Sheila Harris MRN: [id number] DOB:03-23-1956, 67 y.o., female Today's Date: 10/19/2022  END OF SESSION:  PT End of Session - 10/19/22 1448     Visit Number 14    Number of Visits 28    Date for PT Re-Evaluation 11/29/22    Authorization Type [employer] $15 copay; $3200 max    PT Start Time 1448    PT Stop Time 1534    PT Time Calculation (min) 46 min    Activity Tolerance Patient tolerated treatment well              Past Medical History:  Diagnosis Date   Genital herpes    Macular degeneration    Thyroid disease    History reviewed. No pertinent surgical history. Patient Active Problem List   Diagnosis Date Noted   Metatarsalgia of right foot 03/06/2022   Iron deficiency 02/23/2022   Dry age-related macular degeneration 12/08/2021   Chronic cough 08/11/2021   Chronic foot pain, right 07/12/2021   Left shoulder pain 05/05/2021   Hormone replacement therapy (HRT) 07/08/2020   Corn of foot right, 4/5 interspace 06/07/2020   Herpes infection 11/19/2019   Osteoporosis 11/19/2019   Factor V Leiden (HCC) 11/19/2019   Hypothyroidism 11/19/2019   Anxiety 11/19/2019   Trigger finger of right hand 06/07/2016   Carpal tunnel syndrome, right 02/22/2016   Primary osteoarthritis of right hand 12/30/2015   Degenerative disc disease, cervical 12/30/2015   Trigger finger of left hand 10/08/2015   Pes cavus 11/21/2013   Right lumbar radiculopathy 11/21/2013    PCP: Charlann Boxer  REFERRING PROVIDER: Dr Rodney Langton,  Shirlean Kelly, DPM   REFERRING DIAG: Cervical Radiculopathy, Lumbar radiculopathy  THERAPY DIAG:  Other symptoms and signs involving the musculoskeletal system  Muscle weakness (generalized)  Cervical dysfunction  TMJ dysfunction  Rationale for Evaluation and Treatment: Rehabilitation  ONSET DATE: 06/02/22  SUBJECTIVE:                                                                                                                                                                                                          SUBJECTIVE STATEMENT: Dental surgery was last week and it went OK - now adjusting to temporary bridge. Doing okay - headaches are better still has some tightness in the Lt side of he neck into her head. Back has calmed down now. Lt rib area is some better. Working on the ball release with a soft ball in standing through the Lt rib including  the chest to side to mid back.    PERTINENT HISTORY:  neck pain on the off in the past 3-4 months but she as had pain in the neck over the past several years. She has had some pain in the Rt TMJ over the years as well. She has an overbite and jaw is tilted. She has seen a chiropractor in the past. She now has difficulty chewing and has not chewed in the past 2 weeks Chronic pain in the TMJ and neck; costochondritis; arthritis; osteoarthritis   Patient has been seeing podiatry for about 1 year. She had a shot for mortons neuroma which did not help. She also had a stress fracture of Rt 4th metatarsal and wore a boot and had physical therapy. She has also had back issues for "decades" on and off and now MD and DPM believe foot pain may be coming from her back. 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 Pt states pain only "occasionally" goes down her leg, mostly pain is just in her Rt foot and toes and in her back. Pain in back and foot increases with prolonged standing and walking (20-30 minutes)  PAIN:  Are you having pain? Yes: NPRS scale: 3-5/10 Pain location: Lt side of jaw to neck and head; thoracic spine/ ribs Pain description: ache Aggravating factors: walking more than a mile Relieving factors: stretches   PRECAUTIONS: None  WEIGHT BEARING RESTRICTIONS: No  FALLS:  Has patient fallen in last 6 months? No   OCCUPATION: retired - household chores; walking 30-60 min  6 days/wk   PATIENT GOALS: strengthen areas to help decrease pain   NEXT MD VISIT: None scheduled  OBJECTIVE:   DIAGNOSTIC FINDINGS:  Xray 05/10/22: No fracture, dislocation or subluxation. No spondylolisthesis. No osteolytic or osteoblastic changes. Prevertebral and cervical cranial soft tissues are unremarkable. Degenerative disc disease noted with disc space narrowing and marginal osteophytes at C5-6 through C7-T1.  Lumbar MRI shows mild degenerative changes  PATIENT SURVEYS:  FOTO 47 goal 56  POSTURE:  Pt stands with Rt foot in supination Pt reports some symptom relief in foot with trunk extension  Patient presents with head forward posture with increased thoracic kyphosis; shoulders rounded and elevated; scapulae abducted and rotated along the thoracic spine; head of the humerus anterior in orientation.  PALPATION: Lumbar jt mobility WFL for CPAs and UPAs, no TTP lumbar or hip musculature  Muscular tightness ant/lat/posterior cervical musculature; TMD/jaw area Lt > Rt with tenderness into the Lt temple and scalp  10/04/22: persistent muscular tightness ant/lat/posterior cervical musculature; TMD/jaw area Lt > Rt with tenderness into the Lt temple and scalp  CERVICAL ROM: tight end ranges   Active ROM AROM (deg) eval AROM  10/04/22  Flexion 46 47  Extension 45 52  Right lateral flexion 27 30  Left lateral flexion 39 40  Right rotation 60 40  Left rotation 49 40   (Blank rows = not tested)   JAW: TMD opening and lateral deviation limited   10/04/22: Good progress with increasing mobility/ROM in opening and lateral deviation   UPPER LIMB TENSION TEST 10/19/22 - mild tightness Lt UE   LOWER EXTREMITY MMT:    MMT Right eval Left eval  Hip flexion 4+ 4+  Hip extension    Hip abduction 4+ 4+  Hip adduction    Hip internal rotation    Hip external rotation    Knee flexion    Knee extension  Ankle dorsiflexion 3   Ankle plantarflexion 3   Ankle inversion 3    Ankle eversion 3    (Blank rows = not tested)  LUMBAR ROM:   Active  A/PROM  eval  Flexion Limited 25%  Extension WFL  Right lateral flexion Limited 50%  Left lateral flexion Limited 50%  Right rotation Limited 50%  Left rotation Limited 50%   (Blank rows = not tested)  SLS Rt: 1 second  10/04/22: SLS Rt 8 sec; Lt 15 sec                                                                                                                               [facility] Adult PT Treatment:                                                 DATE: 10/19/22: Headache/TMJ UBE L4 x 4 min 2 min fwd/2 min back   Doorway stretch 3 positions 30 sec x 2  Scap squeeze with noodle 5 sec x 10  L's with yellow TB x 20  W's yellow TB x 20 Wall angel x 20 prone scap squeeze with axial extension 5 sec x 5  Lat stretch supine 30 sec x 3  Neural mobilization Lt UE supine 1 min x 2  Manual:  Soft tissue mobilization; deep tissue work through the ant/lat/post cervical musculature; pecs; upper trap; scalp  Working through the Lt posterior shoulder girdle area into lats/teres  Occipital inhibition golf balls ~ 1-2 min  Modalities Moist heat cervical and thoracic area x 7 min  DATE: 10/11/22: Headache/TMJ UBE L4 x 4 min 2 min fwd/2 min back   Doorway stretch 3 positions 30 sec x 2  Scap squeeze with noodle 5 sec x 10  L's with yellow TB x 20  W's yellow TB x 20 Wall angel x 20 prone scap squeeze with axial extension 5 sec x 5  Lat stretch supine 30 sec x 3  Shoulder extension green TB 3 sec x 10  Lat pull Green TB 3 sec x 10 x 2   Manual:  Soft tissue mobilization; deep tissue work through the ant/lat/post cervical musculature; pecs; upper trap; scalp  Working through the Lt posterior shoulder girdle area into lats/teres  Modalities Moist heat cervical and thoracic area x 7 min  [facility] Adult PT Treatment:                                                 DATE: 10/04/22: Headache/TMJ UBE L4 x 4 min 2 min fwd/2 min  back   Doorway stretch 3 positions 30 sec x 2  Scap squeeze with  noodle 5 sec x 10  L's with yellow TB x 20  W's yellow TB x 20 Wall angel x 20 prone scap squeeze with axial extension 5 sec x 5  Manual:  Soft tissue mobilization; deep tissue work through the ant/lat/post cervical musculature; pecs; upper trap; scalp  Trigger Point Dry-Needling  Treatment instructions: Expect mild to moderate muscle soreness. S/S of pneumothorax if dry needled over a lung field, and to seek immediate medical attention should they occur. Patient verbalized understanding of these instructions and education.  Patient Consent Given: Yes Education handout provided: Previously provided Muscles treated: bilat suboccipitals; SCM; cervical paraspinals; acupuncture point at medial brow line   Electrical stimulation performed: No Parameters:  Treatment response/outcome:   Modalities Moist heat cervical and thoracic area x 10 min   PATIENT EDUCATION:  Education details: POC; HEP  Person educated: Patient Education method: Programmer, multimedia, Demonstration, Tactile cues, Verbal cues, and Handouts Education comprehension: verbalized understanding, returned demonstration, verbal cues required, tactile cues required, and needs further education  HOME EXERCISE PROGRAM: Access Code: [email or web address] URL: [email or web address] Date: 10/19/2022 Prepared by: Corlis Leak  Program Notes lying on backtuck chin (keep chin tucked thorugh entire exercise)slide right ear toward right shoulderturn chin to right bring left shoulder out to rest on bed with shoulder at 90 degreesstraighten elbowpoint finger toward floor hold 1 min 2 reps 2times per day   Exercises - Supine Cervical Retraction with Towel  - 2 x daily - 7 x weekly - 1 sets - 5-10 reps - 10 sec  hold - Standing Cervical Retraction  - 2 x daily - 7 x weekly - 1 sets - 10 reps - 5-10 sec  hold - Seated Scapular Retraction  - 2 x daily - 7 x weekly - 1-2 sets - 10  reps - 10 sec  hold - Shoulder External Rotation and Scapular Retraction  - 3 x daily - 7 x weekly - 1 sets - 10 reps - 3-5 sec   hold - Shoulder External Rotation in 45 Degrees Abduction  - 2 x daily - 7 x weekly - 1-2 sets - 10 reps - 3 sec  hold - Shoulder External Rotation and Scapular Retraction with Resistance  - 2 x daily - 7 x weekly - 1 sets - 10 reps - 3-5 sec  hold - Shoulder W - External Rotation with Resistance  - 2 x daily - 7 x weekly - 1-2 sets - 10 reps - 3 sec  hold - Supine Scapular Retraction  - 2 x daily - 7 x weekly - 1 sets - 10 reps - 5-10 sec  hold - Standing Bilateral Low Shoulder Row with Anchored Resistance  - 2 x daily - 7 x weekly - 1-3 sets - 10 reps - 2-3 sec  hold - Drawing Bow  - 1 x daily - 7 x weekly - 1 sets - 10 reps - 3 sec  hold - Standing High Row with Resistance  - 1 x daily - 7 x weekly - 1 sets - 10 reps - 3-5 sec  hold - Cobra  - 1 x daily - 7 x weekly - 1 sets - 10 reps - 10 seconds hold - Heel Raises with Counter Support  - 1 x daily - 7 x weekly - 3 sets - 10 reps - Heel Toe Raises with Counter Support  - 1 x daily - 7 x weekly - 3 sets - 10 reps - Seated Ankle Eversion with Resistance  -  1 x daily - 7 x weekly - 3 sets - 10 reps - Seated Ankle Inversion with Resistance  - 1 x daily - 7 x weekly - 3 sets - 10 reps - Toe Yoga - Alternating Great Toe and Lesser Toe Extension  - 1 x daily - 7 x weekly - 3 sets - 10 reps - Anti-Rotation Lateral Stepping with Press  - 1 x daily - 7 x weekly - 1-2 sets - 10 reps - 2-3 sec  hold - Single Leg Stance  - 1 x daily - 7 x weekly - 2 sets - 5 reps - 10-20 sec hold - Seated Hip Internal Rotation with Ball and Resistance  - 2 x daily - 7 x weekly - 1 sets - 10 reps - 3 sec  hold - Sidelying Reverse Clamshell  - 1 x daily - 7 x weekly - 3 sets - 10 reps - Clamshell  - 1 x daily - 7 x weekly - 3 sets - 10 reps - Sidelying Diagonal Hip Abduction  - 1 x daily - 7 x weekly - 3 sets - 10 reps - Marching with Resistance   - 1 x daily - 7 x weekly - 3 sets - 10 reps - Step Up  - 1 x daily - 7 x weekly - 2-3 sets - 10 reps - 2 sec  hold - Standing Hip Extension with Resistance at Ankles and Unilateral Counter Support  - 1 x daily - 7 x weekly - 2-3 sets - 10 reps - 3 sec  hold - Standing Hip Abduction with Resistance at Ankles and Counter Support  - 1 x daily - 7 x weekly - 2-3 sets - 10 reps - 3 sec  hold - Side Stepping with Resistance at Feet  - 1 x daily - 7 x weekly - Mini Squat with Counter Support  - 2 x daily - 7 x weekly - 1-2 sets - 10 reps - 3 sec  hold - The Diver  - 2 x daily - 7 x weekly - 1 sets - 10 reps - 2-3 sec  hold - Single Leg Balance with Eyes Closed  - 2 x daily - 7 x weekly - 1 sets - 3 reps - 20-30 sec  hold - Romberg Stance Eyes Closed on Foam Pad  - 2 x daily - 7 x weekly - 1 sets - 3 reps - 30 sec  hold - Standing with Head Rotation  - 2 x daily - 7 x weekly - 1 sets - 3 reps - 20-30 sec  hold - Half Tandem Stance Balance with Eyes Closed  - 2 x daily - 7 x weekly - 1 sets - 3 reps - 2-30 sec  hold - Forward T with Counter Support  - 1 x daily - 7 x weekly - 1-3 sets - 10 reps - Standing Lat Pull Down with Resistance - Elbows Bent  - 2 x daily - 7 x weekly - 1 sets - 10 reps - 3 sec  hold  ASSESSMENT:  CLINICAL IMPRESSION: Therese reports no significant pain in the R hip or LB; some tightness continues in the Lt cervical area into the Lt TMJ and head. She has less tightness and discomfort in the Lt thoracic area including the chest/pecs and thoracic spine area. Today she has tightness in the Lt forearm. Added neural mobilization for home. She has persistent muscular tightness through the Lt posterior cervical musculature and Lt thoracic area  as well as posterior inferior scapula - lats and teres. Good response to manual work.    OBJECTIVE IMPAIRMENTS: pain in the neck and jaw as well as into the shoulder area Lt > Rt. Patient presents with poor posture and alignment; limited cervical  mobility and ROM; discomfort with TMD opening and lateral deviation; muscular tightness to palpation through the ant/lat/posterior cervical musculature and jaw areas; weakness in postural musculature of upper core; pain with functional activities including chewing.decreased mobility, decreased ROM, decreased strength, hypomobility, increased fascial restrictions, increased muscle spasms, impaired flexibility, improper body mechanics, postural dysfunction, and pain.    GOALS: Goals reviewed with patient? Yes  SHORT TERM GOALS: Target date: 08/28/2022  Independent in initial HEP  Baseline:  Goal status: met  2.  Instruct patient in improved posture and alignment with functional activities and sitting/standing  Baseline:  Goal status: met   LONG TERM GOALS: Target date: 11/29/2022  Increase cervical mobility and ROM to WNL's throughout with decreased stiffness with ROM  Baseline:  Goal status: on going   2.  Improve posture and alignment with patient to demonstrate improved upright posture with posterior shoulder girdle musculature engaged  Baseline:  Goal status: partially met  3.  Improve TMD opening with patient to report ability chew foods of normal textures  Baseline:  Goal status: on going   4.  Decrease pain in neck and jaw by 50-75% allowing patient to exercise and perform normal functional activities  Baseline:  Goal status: on going   5.  Independent in HEP including aquatic program as indicated  Baseline:  Goal status: met  6.  Improve functional limitation score to 56  Baseline:  Goal status: INITIAL  7.  Pt will improve Rt ankle strength to 4+/5 to improve standing and walking tolerance Baseline:  Goal status: met  8.  Pt will improve Rt SLS to >= 15 seconds to demo improved balance Baseline:  Goal status: on going      PLAN:  PT FREQUENCY: 2x/week  PT DURATION: 8 weeks  PLANNED INTERVENTIONS: Therapeutic exercises, Therapeutic activity,  Neuromuscular re-education, Patient/Family education, Self Care, Joint mobilization, Aquatic Therapy, Dry Needling, Electrical stimulation, Spinal mobilization, Cryotherapy, Moist heat, Taping, Traction, Ultrasound, Ionotophoresis 4mg /ml Dexamethasone, Manual therapy, and Re-evaluation  PLAN FOR NEXT SESSION: DN prox R HS, eccentric strengthening; ankle and hip strength and balance, core strength. review and progress exercise; continue with postural correction and eduction; manual work, DN, modalities as indicated    [employer], PT 10/19/2022, 2:50 PM

## 2022-10-20 DIAGNOSIS — M25562 Pain in left knee: Secondary | ICD-10-CM | POA: Diagnosis not present

## 2022-10-23 ENCOUNTER — Ambulatory Visit: Payer: PPO | Admitting: Sports Medicine

## 2022-10-23 ENCOUNTER — Encounter: Payer: Self-pay | Admitting: Sports Medicine

## 2022-10-24 ENCOUNTER — Encounter: Payer: PPO | Admitting: Physical Therapy

## 2022-10-25 DIAGNOSIS — M6281 Muscle weakness (generalized): Secondary | ICD-10-CM | POA: Diagnosis not present

## 2022-10-25 DIAGNOSIS — M25562 Pain in left knee: Secondary | ICD-10-CM | POA: Diagnosis not present

## 2022-10-31 ENCOUNTER — Ambulatory Visit: Payer: PPO | Admitting: Rehabilitative and Restorative Service Providers"

## 2022-10-31 ENCOUNTER — Encounter: Payer: Self-pay | Admitting: Rehabilitative and Restorative Service Providers"

## 2022-10-31 DIAGNOSIS — R29898 Other symptoms and signs involving the musculoskeletal system: Secondary | ICD-10-CM

## 2022-10-31 DIAGNOSIS — M26609 Unspecified temporomandibular joint disorder, unspecified side: Secondary | ICD-10-CM

## 2022-10-31 DIAGNOSIS — M6281 Muscle weakness (generalized): Secondary | ICD-10-CM

## 2022-10-31 DIAGNOSIS — M539 Dorsopathy, unspecified: Secondary | ICD-10-CM

## 2022-10-31 NOTE — Therapy (Signed)
OUTPATIENT PHYSICAL THERAPY  TREATMENT   Patient Name: Sheila Harris MRN: [id number] DOB:August 16, 1955, 67 y.o., female Today's Date: 10/31/2022  END OF SESSION:  PT End of Session - 10/31/22 1151     Visit Number 15    Number of Visits 28    Date for PT Re-Evaluation 11/29/22    Authorization Type [employer] $15 copay; $3200 max    PT Start Time 1150    PT Stop Time 1232    PT Time Calculation (min) 42 min    Activity Tolerance Patient tolerated treatment well              Past Medical History:  Diagnosis Date   Genital herpes    Macular degeneration    Thyroid disease    History reviewed. No pertinent surgical history. Patient Active Problem List   Diagnosis Date Noted   Metatarsalgia of right foot 03/06/2022   Iron deficiency 02/23/2022   Dry age-related macular degeneration 12/08/2021   Chronic cough 08/11/2021   Chronic foot pain, right 07/12/2021   Left shoulder pain 05/05/2021   Hormone replacement therapy (HRT) 07/08/2020   Corn of foot right, 4/5 interspace 06/07/2020   Herpes infection 11/19/2019   Osteoporosis 11/19/2019   Factor V Leiden (HCC) 11/19/2019   Hypothyroidism 11/19/2019   Anxiety 11/19/2019   Trigger finger of right hand 06/07/2016   Carpal tunnel syndrome, right 02/22/2016   Primary osteoarthritis of right hand 12/30/2015   Degenerative disc disease, cervical 12/30/2015   Trigger finger of left hand 10/08/2015   Pes cavus 11/21/2013   Right lumbar radiculopathy 11/21/2013    PCP: Charlann Boxer  REFERRING PROVIDER: Dr Rodney Langton,  Shirlean Kelly, DPM   REFERRING DIAG: Cervical Radiculopathy, Lumbar radiculopathy  THERAPY DIAG:  Other symptoms and signs involving the musculoskeletal system  Muscle weakness (generalized)  Cervical dysfunction  TMJ dysfunction  Rationale for Evaluation and Treatment: Rehabilitation  ONSET DATE: 06/02/22  SUBJECTIVE:                                                                                                                                                                                                          SUBJECTIVE STATEMENT: Patient reports that she has increased back pain and tightness today. Dental surgery was last week and it went OK - now adjusting to temporary bridge. Doing okay - headaches are still better but she has some tightness in the Lt side of he neck into her head. She has fewer problems with the neck and jaw.    PERTINENT HISTORY:  neck pain on the off in the past 3-4 months but she as had pain in the neck over the past several years. She has had some pain in the Rt TMJ over the years as well. She has an overbite and jaw is tilted. She has seen a chiropractor in the past. She now has difficulty chewing and has not chewed in the past 2 weeks Chronic pain in the TMJ and neck; costochondritis; arthritis; osteoarthritis   Patient has been seeing podiatry for about 1 year. She had a shot for mortons neuroma which did not help. She also had a stress fracture of Rt 4th metatarsal and wore a boot and had physical therapy. She has also had back issues for "decades" on and off and now MD and DPM believe foot pain may be coming from her back. 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 Pt states pain only "occasionally" goes down her leg, mostly pain is just in her Rt foot and toes and in her back. Pain in back and foot increases with prolonged standing and walking (20-30 minutes)  PAIN:  Are you having pain? Yes: NPRS scale: 0/10 Pain location: Lt side of jaw to neck and head; thoracic spine/ ribs Pain description: ache Are you having pain? Yes: NPRS scale: 3-4/10 Pain location: Rt lower back Pain description: tightness; aching Aggravating factors: walking more than a mile Relieving factors: stretches   PRECAUTIONS: None  WEIGHT BEARING RESTRICTIONS: No  FALLS:  Has patient fallen in last 6 months?  No   OCCUPATION: retired - household chores; walking 30-60 min 6 days/wk   PATIENT GOALS: strengthen areas to help decrease pain   NEXT MD VISIT: None scheduled  OBJECTIVE:   DIAGNOSTIC FINDINGS:  Xray 05/10/22: No fracture, dislocation or subluxation. No spondylolisthesis. No osteolytic or osteoblastic changes. Prevertebral and cervical cranial soft tissues are unremarkable. Degenerative disc disease noted with disc space narrowing and marginal osteophytes at C5-6 through C7-T1.  Lumbar MRI shows mild degenerative changes  PATIENT SURVEYS:  FOTO 47 goal 56  POSTURE:  Pt stands with Rt foot in supination Pt reports some symptom relief in foot with trunk extension  Patient presents with head forward posture with increased thoracic kyphosis; shoulders rounded and elevated; scapulae abducted and rotated along the thoracic spine; head of the humerus anterior in orientation.  PALPATION: Lumbar jt mobility WFL for CPAs and UPAs, no TTP lumbar or hip musculature  Muscular tightness ant/lat/posterior cervical musculature; TMD/jaw area Lt > Rt with tenderness into the Lt temple and scalp  10/04/22: persistent muscular tightness ant/lat/posterior cervical musculature; TMD/jaw area Lt > Rt with tenderness into the Lt temple and scalp  CERVICAL ROM: tight end ranges   Active ROM AROM (deg) eval AROM  10/04/22  Flexion 46 47  Extension 45 52  Right lateral flexion 27 30  Left lateral flexion 39 40  Right rotation 60 40  Left rotation 49 40   (Blank rows = not tested)   JAW: TMD opening and lateral deviation limited   10/04/22: Good progress with increasing mobility/ROM in opening and lateral deviation   UPPER LIMB TENSION TEST 10/19/22 - mild tightness Lt UE   LOWER EXTREMITY MMT:    MMT Right eval Left eval  Hip flexion 4+ 4+  Hip extension    Hip abduction 4+ 4+  Hip adduction    Hip internal rotation    Hip external rotation    Knee flexion  Knee extension    Ankle  dorsiflexion 3   Ankle plantarflexion 3   Ankle inversion 3   Ankle eversion 3    (Blank rows = not tested)  LUMBAR ROM:   Active  A/PROM  eval AROM 10/31/22  Flexion Limited 25% Limited  20%  Extension [facility] WFL  Right lateral flexion Limited 50% Limited  25%  Left lateral flexion Limited 50% Limited  25%  Right rotation Limited 50% Limited 50%  Left rotation Limited 50% Limited  50%   (Blank rows = not tested) 10/31/22:  Rt PSIS slightly lower than Lt - posture otherwise WFL's    SLS Rt: 1 second  10/04/22: SLS Rt 8 sec; Lt 15 sec                                                                                                                               [facility] Adult PT Treatment:                                                 DATE: 10/31/22: Headache/TMJ Hip flexor stretch supine thomas 30 sec x 3  Prone press up   Manual:  Soft tissue mobilization; deep tissue work through the lumbar musculature Working through the Rt lumbar to posterior hip  PA mobs Rt greater trochanter   Modalities Moist heat lumbar to Rt posterior hip x 10 min  DATE: 10/19/22: Headache/TMJ UBE L4 x 4 min 2 min fwd/2 min back   Doorway stretch 3 positions 30 sec x 2  Scap squeeze with noodle 5 sec x 10  L's with yellow TB x 20  W's yellow TB x 20 Wall angel x 20 prone scap squeeze with axial extension 5 sec x 5  Lat stretch supine 30 sec x 3  Neural mobilization Lt UE supine 1 min x 2  Manual:  Soft tissue mobilization; deep tissue work through the ant/lat/post cervical musculature; pecs; upper trap; scalp  Working through the Lt posterior shoulder girdle area into lats/teres  Occipital inhibition golf balls ~ 1-2 min  Modalities Moist heat cervical and thoracic area x 7 min   [facility] Adult PT Treatment:  (dry needling)                                               DATE: 10/04/22: Headache/TMJ UBE L4 x 4 min 2 min fwd/2 min back   Doorway stretch 3 positions 30 sec x 2  Scap squeeze with  noodle 5 sec x 10  L's with yellow TB x 20  W's yellow TB x 20 Wall angel x 20 prone scap squeeze with axial extension 5 sec x 5  Manual:  Soft tissue mobilization; deep tissue work through the ant/lat/post cervical musculature; pecs; upper trap; scalp  Trigger Point Dry-Needling  Treatment instructions: Expect mild to moderate muscle soreness. S/S of pneumothorax if dry needled over a lung field, and to seek immediate medical attention should they occur. Patient verbalized understanding of these instructions and education.  Patient Consent Given: Yes Education handout provided: Previously provided Muscles treated: bilat suboccipitals; SCM; cervical paraspinals; acupuncture point at medial brow line   Electrical stimulation performed: No Parameters:  Treatment response/outcome:   Modalities Moist heat cervical and thoracic area x 10 min   PATIENT EDUCATION:  Education details: POC; HEP  Person educated: Patient Education method: Programmer, multimedia, Demonstration, Tactile cues, Verbal cues, and Handouts Education comprehension: verbalized understanding, returned demonstration, verbal cues required, tactile cues required, and needs further education  HOME EXERCISE PROGRAM: Access Code: [email or web address] URL: [email or web address] Date: 10/19/2022 Prepared by: Corlis Leak  Program Notes lying on backtuck chin (keep chin tucked thorugh entire exercise)slide right ear toward right shoulderturn chin to right bring left shoulder out to rest on bed with shoulder at 90 degreesstraighten elbowpoint finger toward floor hold 1 min 2 reps 2times per day   Exercises - Supine Cervical Retraction with Towel  - 2 x daily - 7 x weekly - 1 sets - 5-10 reps - 10 sec  hold - Standing Cervical Retraction  - 2 x daily - 7 x weekly - 1 sets - 10 reps - 5-10 sec  hold - Seated Scapular Retraction  - 2 x daily - 7 x weekly - 1-2 sets - 10 reps - 10 sec  hold - Shoulder External Rotation and Scapular  Retraction  - 3 x daily - 7 x weekly - 1 sets - 10 reps - 3-5 sec   hold - Shoulder External Rotation in 45 Degrees Abduction  - 2 x daily - 7 x weekly - 1-2 sets - 10 reps - 3 sec  hold - Shoulder External Rotation and Scapular Retraction with Resistance  - 2 x daily - 7 x weekly - 1 sets - 10 reps - 3-5 sec  hold - Shoulder W - External Rotation with Resistance  - 2 x daily - 7 x weekly - 1-2 sets - 10 reps - 3 sec  hold - Supine Scapular Retraction  - 2 x daily - 7 x weekly - 1 sets - 10 reps - 5-10 sec  hold - Standing Bilateral Low Shoulder Row with Anchored Resistance  - 2 x daily - 7 x weekly - 1-3 sets - 10 reps - 2-3 sec  hold - Drawing Bow  - 1 x daily - 7 x weekly - 1 sets - 10 reps - 3 sec  hold - Standing High Row with Resistance  - 1 x daily - 7 x weekly - 1 sets - 10 reps - 3-5 sec  hold - Cobra  - 1 x daily - 7 x weekly - 1 sets - 10 reps - 10 seconds hold - Heel Raises with Counter Support  - 1 x daily - 7 x weekly - 3 sets - 10 reps - Heel Toe Raises with Counter Support  - 1 x daily - 7 x weekly - 3 sets - 10 reps - Seated Ankle Eversion with Resistance  - 1 x daily - 7 x weekly - 3 sets - 10 reps - Seated Ankle Inversion with Resistance  - 1 x daily - 7 x weekly - 3 sets - 10  reps - Toe Yoga - Alternating Great Toe and Lesser Toe Extension  - 1 x daily - 7 x weekly - 3 sets - 10 reps - Anti-Rotation Lateral Stepping with Press  - 1 x daily - 7 x weekly - 1-2 sets - 10 reps - 2-3 sec  hold - Single Leg Stance  - 1 x daily - 7 x weekly - 2 sets - 5 reps - 10-20 sec hold - Seated Hip Internal Rotation with Ball and Resistance  - 2 x daily - 7 x weekly - 1 sets - 10 reps - 3 sec  hold - Sidelying Reverse Clamshell  - 1 x daily - 7 x weekly - 3 sets - 10 reps - Clamshell  - 1 x daily - 7 x weekly - 3 sets - 10 reps - Sidelying Diagonal Hip Abduction  - 1 x daily - 7 x weekly - 3 sets - 10 reps - Marching with Resistance  - 1 x daily - 7 x weekly - 3 sets - 10 reps - Step Up  - 1 x  daily - 7 x weekly - 2-3 sets - 10 reps - 2 sec  hold - Standing Hip Extension with Resistance at Ankles and Unilateral Counter Support  - 1 x daily - 7 x weekly - 2-3 sets - 10 reps - 3 sec  hold - Standing Hip Abduction with Resistance at Ankles and Counter Support  - 1 x daily - 7 x weekly - 2-3 sets - 10 reps - 3 sec  hold - Side Stepping with Resistance at Feet  - 1 x daily - 7 x weekly - Mini Squat with Counter Support  - 2 x daily - 7 x weekly - 1-2 sets - 10 reps - 3 sec  hold - The Diver  - 2 x daily - 7 x weekly - 1 sets - 10 reps - 2-3 sec  hold - Single Leg Balance with Eyes Closed  - 2 x daily - 7 x weekly - 1 sets - 3 reps - 20-30 sec  hold - Romberg Stance Eyes Closed on Foam Pad  - 2 x daily - 7 x weekly - 1 sets - 3 reps - 30 sec  hold - Standing with Head Rotation  - 2 x daily - 7 x weekly - 1 sets - 3 reps - 20-30 sec  hold - Half Tandem Stance Balance with Eyes Closed  - 2 x daily - 7 x weekly - 1 sets - 3 reps - 2-30 sec  hold - Forward T with Counter Support  - 1 x daily - 7 x weekly - 1-3 sets - 10 reps - Standing Lat Pull Down with Resistance - Elbows Bent  - 2 x daily - 7 x weekly - 1 sets - 10 reps - 3 sec  hold  ASSESSMENT:  CLINICAL IMPRESSION: Qadriyyah reports increased pain in the Rt lumbar spine to posterior hip; minimal tightness in the Lt cervical area or Lt TMJ and head. She has less tightness and discomfort in the Lt thoracic area including the chest/pecs and thoracic spine area.  Encouraged continued work with home program including stretching, strengthening, myofacial ball release work per patient's extensive home program. Added neural mobilization for home.    OBJECTIVE IMPAIRMENTS: pain in the neck and jaw as well as into the shoulder area Lt > Rt. Patient presents with poor posture and alignment; limited cervical mobility and ROM; discomfort with TMD opening and  lateral deviation; muscular tightness to palpation through the ant/lat/posterior cervical  musculature and jaw areas; weakness in postural musculature of upper core; pain with functional activities including chewing.decreased mobility, decreased ROM, decreased strength, hypomobility, increased fascial restrictions, increased muscle spasms, impaired flexibility, improper body mechanics, postural dysfunction, and pain.    GOALS: Goals reviewed with patient? Yes  SHORT TERM GOALS: Target date: 08/28/2022  Independent in initial HEP  Baseline:  Goal status: met  2.  Instruct patient in improved posture and alignment with functional activities and sitting/standing  Baseline:  Goal status: met   LONG TERM GOALS: Target date: 11/29/2022  Increase cervical mobility and ROM to WNL's throughout with decreased stiffness with ROM  Baseline:  Goal status: on going   2.  Improve posture and alignment with patient to demonstrate improved upright posture with posterior shoulder girdle musculature engaged  Baseline:  Goal status: partially met  3.  Improve TMD opening with patient to report ability chew foods of normal textures  Baseline:  Goal status: on going   4.  Decrease pain in neck and jaw by 50-75% allowing patient to exercise and perform normal functional activities  Baseline:  Goal status: on going   5.  Independent in HEP including aquatic program as indicated  Baseline:  Goal status: met  6.  Improve functional limitation score to 56  Baseline:  Goal status: INITIAL  7.  Pt will improve Rt ankle strength to 4+/5 to improve standing and walking tolerance Baseline:  Goal status: met  8.  Pt will improve Rt SLS to >= 15 seconds to demo improved balance Baseline:  Goal status: on going      PLAN:  PT FREQUENCY: 2x/week  PT DURATION: 8 weeks  PLANNED INTERVENTIONS: Therapeutic exercises, Therapeutic activity, Neuromuscular re-education, Patient/Family education, Self Care, Joint mobilization, Aquatic Therapy, Dry Needling, Electrical stimulation, Spinal  mobilization, Cryotherapy, Moist heat, Taping, Traction, Ultrasound, Ionotophoresis 4mg /ml Dexamethasone, Manual therapy, and Re-evaluation  PLAN FOR NEXT SESSION: DN prox R HS, eccentric strengthening; ankle and hip strength and balance, core strength. review and progress exercise; continue with postural correction and eduction; manual work, DN, modalities as indicated    [employer], PT 10/31/2022, 11:55 AM

## 2022-11-07 ENCOUNTER — Ambulatory Visit: Payer: PPO | Admitting: Rehabilitative and Restorative Service Providers"

## 2022-11-09 ENCOUNTER — Ambulatory Visit: Payer: PPO | Admitting: Sports Medicine

## 2022-11-09 ENCOUNTER — Ambulatory Visit (INDEPENDENT_AMBULATORY_CARE_PROVIDER_SITE_OTHER): Payer: PPO | Admitting: Sports Medicine

## 2022-11-09 DIAGNOSIS — S8002XA Contusion of left knee, initial encounter: Secondary | ICD-10-CM | POA: Diagnosis not present

## 2022-11-09 NOTE — Progress Notes (Signed)
    Procedures performed today:    None.  Independent interpretation of notes and tests performed by another provider:   None.  Brief History, Exam, Impression, and Recommendations:    Contusion of knee, left This is a 67 year old female, approximate 3 weeks ago she was at the pool, she used shower, unfortunately part of the shower bench fell and hit her in the lateral aspect of her left knee just proximal to the joint line. She had immediate pain, bruising, swelling. She was seen by the local orthopedic group, diagnosed with a contusion, she was discharged, she has improved considerably, but would like me to serve as a second opinion regarding her injury. Her knee is unremarkable to inspection compared to the contralateral side, she has full motion, full strength, all ligamentous structures are intact, she does however have some discrete tenderness along the posterior joint line. I do not think this is an IT band syndrome but I do think it is more of a bone contusion. As she is continuing to improve we will continue conservative treatment, watchful waiting, and in 6 weeks if still having discomfort we will proceed with MRI.    ____________________________________________ Ihor Austin. Benjamin Stain, M.D., ABFM., CAQSM., AME. Primary Care and Sports Medicine [facility]  Adjunct Professor of Family Medicine  [place] [employer] [facility] [employer]  Restaurant manager, fast food

## 2022-11-09 NOTE — Assessment & Plan Note (Signed)
This is a 67 year old female, approximate 3 weeks ago she was at the pool, she used shower, unfortunately part of the shower bench fell and hit her in the lateral aspect of her left knee just proximal to the joint line. She had immediate pain, bruising, swelling. She was seen by the local orthopedic group, diagnosed with a contusion, she was discharged, she has improved considerably, but would like me to serve as a second opinion regarding her injury. Her knee is unremarkable to inspection compared to the contralateral side, she has full motion, full strength, all ligamentous structures are intact, she does however have some discrete tenderness along the posterior joint line. I do not think this is an IT band syndrome but I do think it is more of a bone contusion. As she is continuing to improve we will continue conservative treatment, watchful waiting, and in 6 weeks if still having discomfort we will proceed with MRI.

## 2022-11-14 ENCOUNTER — Ambulatory Visit: Payer: PPO | Admitting: Rehabilitative and Restorative Service Providers"

## 2022-11-14 ENCOUNTER — Encounter: Payer: Self-pay | Admitting: Rehabilitative and Restorative Service Providers"

## 2022-11-14 DIAGNOSIS — R29898 Other symptoms and signs involving the musculoskeletal system: Secondary | ICD-10-CM

## 2022-11-14 DIAGNOSIS — M26609 Unspecified temporomandibular joint disorder, unspecified side: Secondary | ICD-10-CM

## 2022-11-14 DIAGNOSIS — M539 Dorsopathy, unspecified: Secondary | ICD-10-CM

## 2022-11-14 DIAGNOSIS — M6281 Muscle weakness (generalized): Secondary | ICD-10-CM

## 2022-11-14 NOTE — Therapy (Signed)
OUTPATIENT PHYSICAL THERAPY  TREATMENT   Patient Name: Sheila Harris MRN: [id number] DOB:[birth year], 67 y.o., female Today's Date: 11/14/2022  END OF SESSION:  PT End of Session - 11/14/22 1533     Visit Number 16    Number of Visits 28    Date for PT Re-Evaluation 11/29/22    Authorization Type [employer] $15 copay; $3200 max    PT Start Time 1530    PT Stop Time 1618    PT Time Calculation (min) 48 min    Activity Tolerance Patient tolerated treatment well              Past Medical History:  Diagnosis Date   Genital herpes    Macular degeneration    Thyroid disease    History reviewed. No pertinent surgical history. Patient Active Problem List   Diagnosis Date Noted   Contusion of knee, left 11/09/2022   Metatarsalgia of right foot 03/06/2022   Iron deficiency 02/23/2022   Dry age-related macular degeneration 12/08/2021   Chronic cough 08/11/2021   Chronic foot pain, right 07/12/2021   Left shoulder pain 05/05/2021   Hormone replacement therapy (HRT) 07/08/2020   Corn of foot right, 4/5 interspace 06/07/2020   Herpes infection 11/19/2019   Osteoporosis 11/19/2019   Factor V Leiden (HCC) 11/19/2019   Hypothyroidism 11/19/2019   Anxiety 11/19/2019   Trigger finger of right hand 06/07/2016   Carpal tunnel syndrome, right 02/22/2016   Primary osteoarthritis of right hand 12/30/2015   Degenerative disc disease, cervical 12/30/2015   Trigger finger of left hand 10/08/2015   Pes cavus 11/21/2013   Right lumbar radiculopathy 11/21/2013    PCP: Charlann Boxer  REFERRING PROVIDER: Dr Rodney Langton,  Shirlean Kelly, DPM   REFERRING DIAG: Cervical Radiculopathy, Lumbar radiculopathy  THERAPY DIAG:  Other symptoms and signs involving the musculoskeletal system  Muscle weakness (generalized)  Cervical dysfunction  TMJ dysfunction  Rationale for Evaluation and Treatment: Rehabilitation  ONSET DATE: 06/02/22  SUBJECTIVE:                                                                                                                                                                                                          SUBJECTIVE STATEMENT: Patient reports that she has persistent back and Rt hip pain. She will have her permanent bridge placed next week. She is working on her exercises at home. She is modifying exercises. Doing okay - headaches are still better but she has some tightness in the Lt side of he neck into her head. She  has fewer problems with the neck and jaw but does have increased pain in the jaw with a lot of chewing. She was exercising in the pool ~ 2 times/week but is taking a break for now.   PERTINENT HISTORY:  neck pain on the off in the past 3-4 months but she as had pain in the neck over the past several years. She has had some pain in the Rt TMJ over the years as well. She has an overbite and jaw is tilted. She has seen a chiropractor in the past. She now has difficulty chewing and has not chewed in the past 2 weeks Chronic pain in the TMJ and neck; costochondritis; arthritis; osteoarthritis   Patient has been seeing podiatry for about 1 year. She had a shot for mortons neuroma which did not help. She also had a stress fracture of Rt 4th metatarsal and wore a boot and had physical therapy. She has also had back issues for "decades" on and off and now MD and DPM believe foot pain may be coming from her back. 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 Pt states pain only "occasionally" goes down her leg, mostly pain is just in her Rt foot and toes and in her back. Pain in back and foot increases with prolonged standing and walking (20-30 minutes)  PAIN:  Are you having pain? Yes: NPRS scale: 3/10 Pain location: Lt side of jaw to neck and head; thoracic spine/ ribs Pain description: ache Are you having pain? Yes: NPRS scale: 6/10 Pain location: Rt lower back Pain  description: tightness; aching Aggravating factors: walking more than a mile Relieving factors: stretches   PRECAUTIONS: None  WEIGHT BEARING RESTRICTIONS: No  FALLS:  Has patient fallen in last 6 months? No   OCCUPATION: retired - household chores; walking 30-60 min 6 days/wk   PATIENT GOALS: strengthen areas to help decrease pain   NEXT MD VISIT: None scheduled  OBJECTIVE:   DIAGNOSTIC FINDINGS:  Xray 05/10/22: No fracture, dislocation or subluxation. No spondylolisthesis. No osteolytic or osteoblastic changes. Prevertebral and cervical cranial soft tissues are unremarkable. Degenerative disc disease noted with disc space narrowing and marginal osteophytes at C5-6 through C7-T1.  Lumbar MRI shows mild degenerative changes  PATIENT SURVEYS:  FOTO 47 goal 56  POSTURE:  Pt stands with Rt foot in supination Pt reports some symptom relief in foot with trunk extension  Patient presents with head forward posture with increased thoracic kyphosis; shoulders rounded and elevated; scapulae abducted and rotated along the thoracic spine; head of the humerus anterior in orientation.  PALPATION: Lumbar jt mobility WFL for CPAs and UPAs, no TTP lumbar or hip musculature  Muscular tightness ant/lat/posterior cervical musculature; TMD/jaw area Lt > Rt with tenderness into the Lt temple and scalp  10/04/22: persistent muscular tightness ant/lat/posterior cervical musculature; TMD/jaw area Lt > Rt with tenderness into the Lt temple and scalp  CERVICAL ROM: tight end ranges   Active ROM AROM (deg) eval AROM  10/04/22  Flexion 46 47  Extension 45 52  Right lateral flexion 27 30  Left lateral flexion 39 40  Right rotation 60 40  Left rotation 49 40   (Blank rows = not tested)   JAW: TMD opening and lateral deviation limited   10/04/22: Good progress with increasing mobility/ROM in opening and lateral deviation   UPPER LIMB TENSION TEST 10/19/22 - mild tightness Lt UE   LOWER  EXTREMITY MMT:  MMT Right eval Left eval  Hip flexion 4+ 4+  Hip extension    Hip abduction 4+ 4+  Hip adduction    Hip internal rotation    Hip external rotation    Knee flexion    Knee extension    Ankle dorsiflexion 3   Ankle plantarflexion 3   Ankle inversion 3   Ankle eversion 3    (Blank rows = not tested)  LUMBAR ROM:   Active  A/PROM  eval AROM 10/31/22  Flexion Limited 25% Limited  20%  Extension [place] WFL  Right lateral flexion Limited 50% Limited  25%  Left lateral flexion Limited 50% Limited  25%  Right rotation Limited 50% Limited 50%  Left rotation Limited 50% Limited  50%   (Blank rows = not tested) 10/31/22:  Rt PSIS slightly lower than Lt - posture otherwise WFL's    SLS Rt: 1 second  10/04/22: SLS Rt 8 sec; Lt 15 sec    DATE: 11/14/22: Headache/TMJ Nustep L4 x 4 min  Lat stretch supine 45 sec x 3  Trunk rotation supine 10 sec x 5  Hip adductor stretch with strap 30 sec x 3 Rt/Lt  Sitting lateral trunk stretch 30 sec x 3 Rt/Lt  Sitting trunk rotation 10 sec x 3 Rt/Lt  Piriformis stretch supine travell 30 sec x 3 Hip flexor stretch supine thomas 20 sec x 1   Manual:   Modalities Moist heat cervical and thoracic area x 8 min                                                                                                                            Self Care:  Occipital inhibition golf balls ~ 1-2 min (HEP - using soft balls from the dollar store)   [facility] Adult PT Treatment:                                                 DATE: 10/31/22: Headache/TMJ Hip flexor stretch supine thomas 30 sec x 3  Prone press up   Manual:  Soft tissue mobilization; deep tissue work through the lumbar musculature Working through the Rt lumbar to posterior hip  PA mobs Rt greater trochanter   Modalities Moist heat lumbar to Rt posterior hip x 10 min   [facility] Adult PT Treatment:  (dry needling)                                               DATE:  10/04/22: Headache/TMJ UBE L4 x 4 min 2 min fwd/2 min back   Doorway stretch 3 positions 30 sec x 2  Scap squeeze with noodle 5 sec x 10  L's with yellow  TB x 20  W's yellow TB x 20 Wall angel x 20 prone scap squeeze with axial extension 5 sec x 5  Manual:  Soft tissue mobilization; deep tissue work through the ant/lat/post cervical musculature; pecs; upper trap; scalp  Trigger Point Dry-Needling  Treatment instructions: Expect mild to moderate muscle soreness. S/S of pneumothorax if dry needled over a lung field, and to seek immediate medical attention should they occur. Patient verbalized understanding of these instructions and education.  Patient Consent Given: Yes Education handout provided: Previously provided Muscles treated: bilat suboccipitals; SCM; cervical paraspinals; acupuncture point at medial brow line   Electrical stimulation performed: No Parameters:  Treatment response/outcome:   Modalities Moist heat cervical and thoracic area x 10 min   PATIENT EDUCATION:  Education details: POC; HEP  Person educated: Patient Education method: Programmer, multimedia, Demonstration, Tactile cues, Verbal cues, and Handouts Education comprehension: verbalized understanding, returned demonstration, verbal cues required, tactile cues required, and needs further education  HOME EXERCISE PROGRAM: Access Code: [email or web address] URL: [email or web address] Date: 10/19/2022 Prepared by: Corlis Leak  Program Notes lying on backtuck chin (keep chin tucked thorugh entire exercise)slide right ear toward right shoulderturn chin to right bring left shoulder out to rest on bed with shoulder at 90 degreesstraighten elbowpoint finger toward floor hold 1 min 2 reps 2times per day   Exercises - Supine Cervical Retraction with Towel  - 2 x daily - 7 x weekly - 1 sets - 5-10 reps - 10 sec  hold - Standing Cervical Retraction  - 2 x daily - 7 x weekly - 1 sets - 10 reps - 5-10 sec  hold - Seated Scapular  Retraction  - 2 x daily - 7 x weekly - 1-2 sets - 10 reps - 10 sec  hold - Shoulder External Rotation and Scapular Retraction  - 3 x daily - 7 x weekly - 1 sets - 10 reps - 3-5 sec   hold - Shoulder External Rotation in 45 Degrees Abduction  - 2 x daily - 7 x weekly - 1-2 sets - 10 reps - 3 sec  hold - Shoulder External Rotation and Scapular Retraction with Resistance  - 2 x daily - 7 x weekly - 1 sets - 10 reps - 3-5 sec  hold - Shoulder W - External Rotation with Resistance  - 2 x daily - 7 x weekly - 1-2 sets - 10 reps - 3 sec  hold - Supine Scapular Retraction  - 2 x daily - 7 x weekly - 1 sets - 10 reps - 5-10 sec  hold - Standing Bilateral Low Shoulder Row with Anchored Resistance  - 2 x daily - 7 x weekly - 1-3 sets - 10 reps - 2-3 sec  hold - Drawing Bow  - 1 x daily - 7 x weekly - 1 sets - 10 reps - 3 sec  hold - Standing High Row with Resistance  - 1 x daily - 7 x weekly - 1 sets - 10 reps - 3-5 sec  hold - Cobra  - 1 x daily - 7 x weekly - 1 sets - 10 reps - 10 seconds hold - Heel Raises with Counter Support  - 1 x daily - 7 x weekly - 3 sets - 10 reps - Heel Toe Raises with Counter Support  - 1 x daily - 7 x weekly - 3 sets - 10 reps - Seated Ankle Eversion with Resistance  - 1 x daily - 7 x weekly - 3  sets - 10 reps - Seated Ankle Inversion with Resistance  - 1 x daily - 7 x weekly - 3 sets - 10 reps - Toe Yoga - Alternating Great Toe and Lesser Toe Extension  - 1 x daily - 7 x weekly - 3 sets - 10 reps - Anti-Rotation Lateral Stepping with Press  - 1 x daily - 7 x weekly - 1-2 sets - 10 reps - 2-3 sec  hold - Single Leg Stance  - 1 x daily - 7 x weekly - 2 sets - 5 reps - 10-20 sec hold - Seated Hip Internal Rotation with Ball and Resistance  - 2 x daily - 7 x weekly - 1 sets - 10 reps - 3 sec  hold - Sidelying Reverse Clamshell  - 1 x daily - 7 x weekly - 3 sets - 10 reps - Clamshell  - 1 x daily - 7 x weekly - 3 sets - 10 reps - Sidelying Diagonal Hip Abduction  - 1 x daily - 7 x  weekly - 3 sets - 10 reps - Marching with Resistance  - 1 x daily - 7 x weekly - 3 sets - 10 reps - Step Up  - 1 x daily - 7 x weekly - 2-3 sets - 10 reps - 2 sec  hold - Standing Hip Extension with Resistance at Ankles and Unilateral Counter Support  - 1 x daily - 7 x weekly - 2-3 sets - 10 reps - 3 sec  hold - Standing Hip Abduction with Resistance at Ankles and Counter Support  - 1 x daily - 7 x weekly - 2-3 sets - 10 reps - 3 sec  hold - Side Stepping with Resistance at Feet  - 1 x daily - 7 x weekly - Mini Squat with Counter Support  - 2 x daily - 7 x weekly - 1-2 sets - 10 reps - 3 sec  hold - The Diver  - 2 x daily - 7 x weekly - 1 sets - 10 reps - 2-3 sec  hold - Single Leg Balance with Eyes Closed  - 2 x daily - 7 x weekly - 1 sets - 3 reps - 20-30 sec  hold - Romberg Stance Eyes Closed on Foam Pad  - 2 x daily - 7 x weekly - 1 sets - 3 reps - 30 sec  hold - Standing with Head Rotation  - 2 x daily - 7 x weekly - 1 sets - 3 reps - 20-30 sec  hold - Half Tandem Stance Balance with Eyes Closed  - 2 x daily - 7 x weekly - 1 sets - 3 reps - 2-30 sec  hold - Forward T with Counter Support  - 1 x daily - 7 x weekly - 1-3 sets - 10 reps - Standing Lat Pull Down with Resistance - Elbows Bent  - 2 x daily - 7 x weekly - 1 sets - 10 reps - 3 sec  hold  ASSESSMENT:  CLINICAL IMPRESSION: Lender reports increased pain in the Rt lumbar spine to posterior hip; minimal tightness in the Lt cervical area or Lt TMJ and head. She has less tightness and discomfort in the Lt thoracic area including the chest/pecs and thoracic spine area.  Encouraged continued work with home program including stretching, strengthening, myofacial ball release work per patient's extensive home program.     OBJECTIVE IMPAIRMENTS: pain in the neck and jaw as well as into the shoulder area  Lt > Rt. Patient presents with poor posture and alignment; limited cervical mobility and ROM; discomfort with TMD opening and lateral  deviation; muscular tightness to palpation through the ant/lat/posterior cervical musculature and jaw areas; weakness in postural musculature of upper core; pain with functional activities including chewing.decreased mobility, decreased ROM, decreased strength, hypomobility, increased fascial restrictions, increased muscle spasms, impaired flexibility, improper body mechanics, postural dysfunction, and pain.    GOALS: Goals reviewed with patient? Yes  SHORT TERM GOALS: Target date: 08/28/2022  Independent in initial HEP  Baseline:  Goal status: met  2.  Instruct patient in improved posture and alignment with functional activities and sitting/standing  Baseline:  Goal status: met   LONG TERM GOALS: Target date: 11/29/2022  Increase cervical mobility and ROM to WNL's throughout with decreased stiffness with ROM  Baseline:  Goal status: on going   2.  Improve posture and alignment with patient to demonstrate improved upright posture with posterior shoulder girdle musculature engaged  Baseline:  Goal status: partially met  3.  Improve TMD opening with patient to report ability chew foods of normal textures  Baseline:  Goal status: on going   4.  Decrease pain in neck and jaw by 50-75% allowing patient to exercise and perform normal functional activities  Baseline:  Goal status: on going   5.  Independent in HEP including aquatic program as indicated  Baseline:  Goal status: met  6.  Improve functional limitation score to 56  Baseline:  Goal status: INITIAL  7.  Pt will improve Rt ankle strength to 4+/5 to improve standing and walking tolerance Baseline:  Goal status: met  8.  Pt will improve Rt SLS to >= 15 seconds to demo improved balance Baseline:  Goal status: on going      PLAN:  PT FREQUENCY: 2x/week  PT DURATION: 8 weeks  PLANNED INTERVENTIONS: Therapeutic exercises, Therapeutic activity, Neuromuscular re-education, Patient/Family education, Self Care,  Joint mobilization, Aquatic Therapy, Dry Needling, Electrical stimulation, Spinal mobilization, Cryotherapy, Moist heat, Taping, Traction, Ultrasound, Ionotophoresis 4mg /ml Dexamethasone, Manual therapy, and Re-evaluation  PLAN FOR NEXT SESSION: DN prox R HS, eccentric strengthening; ankle and hip strength and balance, core strength. review and progress exercise; continue with postural correction and eduction; manual work, DN, modalities as indicated    [employer], PT 11/14/2022, 3:36 PM

## 2022-11-15 ENCOUNTER — Ambulatory Visit: Payer: PPO | Admitting: Sports Medicine

## 2022-11-20 ENCOUNTER — Other Ambulatory Visit: Payer: Self-pay | Admitting: Family Medicine

## 2022-11-23 ENCOUNTER — Ambulatory Visit: Payer: PPO | Admitting: Rehabilitative and Restorative Service Providers"

## 2022-11-29 ENCOUNTER — Encounter: Payer: Self-pay | Admitting: Rehabilitative and Restorative Service Providers"

## 2022-11-29 ENCOUNTER — Ambulatory Visit: Payer: PPO | Attending: Sports Medicine | Admitting: Rehabilitative and Restorative Service Providers"

## 2022-11-29 DIAGNOSIS — M26609 Unspecified temporomandibular joint disorder, unspecified side: Secondary | ICD-10-CM | POA: Diagnosis present

## 2022-11-29 DIAGNOSIS — M539 Dorsopathy, unspecified: Secondary | ICD-10-CM | POA: Diagnosis present

## 2022-11-29 DIAGNOSIS — M6281 Muscle weakness (generalized): Secondary | ICD-10-CM | POA: Insufficient documentation

## 2022-11-29 DIAGNOSIS — R29898 Other symptoms and signs involving the musculoskeletal system: Secondary | ICD-10-CM | POA: Insufficient documentation

## 2022-11-29 NOTE — Therapy (Signed)
OUTPATIENT PHYSICAL THERAPY  TREATMENT  AND DISCHARGE SUMMARY PHYSICAL THERAPY DISCHARGE SUMMARY  Visits from Start of Care: 17  Current functional level related to goals / functional outcomes: See progress note for discharge status   Remaining deficits: Continued intermittent pain    Education / Equipment: HEP   Patient agrees to discharge. Patient goals were partially met. Patient is being discharged due to patient being independent in HEP .  Patient Name: Sheila Harris MRN: [id number] DOB:September 17, 1955, 67 y.o., female Today's Date: 11/29/2022  END OF SESSION:  PT End of Session - 11/29/22 0938     Visit Number 17    Number of Visits 28    Date for PT Re-Evaluation 11/29/22    Authorization Type [employer] $15 copay; $3200 max    PT Start Time 0935    PT Stop Time 1015    PT Time Calculation (min) 40 min              Past Medical History:  Diagnosis Date   Genital herpes    Macular degeneration    Thyroid disease    History reviewed. No pertinent surgical history. Patient Active Problem List   Diagnosis Date Noted   Contusion of knee, left 11/09/2022   Metatarsalgia of right foot 03/06/2022   Iron deficiency 02/23/2022   Dry age-related macular degeneration 12/08/2021   Chronic cough 08/11/2021   Chronic foot pain, right 07/12/2021   Left shoulder pain 05/05/2021   Hormone replacement therapy (HRT) 07/08/2020   Corn of foot right, 4/5 interspace 06/07/2020   Herpes infection 11/19/2019   Osteoporosis 11/19/2019   Factor V Leiden (HCC) 11/19/2019   Hypothyroidism 11/19/2019   Anxiety 11/19/2019   Trigger finger of right hand 06/07/2016   Carpal tunnel syndrome, right 02/22/2016   Primary osteoarthritis of right hand 12/30/2015   Degenerative disc disease, cervical 12/30/2015   Trigger finger of left hand 10/08/2015   Pes cavus 11/21/2013   Right lumbar radiculopathy 11/21/2013    PCP: Charlann Boxer  REFERRING PROVIDER: Dr Rodney Langton,  Shirlean Kelly, DPM   REFERRING DIAG: Cervical Radiculopathy, Lumbar radiculopathy  THERAPY DIAG:  Other symptoms and signs involving the musculoskeletal system  Muscle weakness (generalized)  Cervical dysfunction  TMJ dysfunction  Rationale for Evaluation and Treatment: Rehabilitation  ONSET DATE: 06/02/22  SUBJECTIVE:  SUBJECTIVE STATEMENT: Patient reports that she has tightness in the Lt arm from a lifting injury last week. She is trying to massage area. She has persistent neck, back and Rt hip pain. She will have her permanent bridge placed tomorrow. She is working on her exercises at home. She is modifying exercises. Doing okay - headaches are still better but she has some tightness in the Lt side of he neck into her head. She has fewer problems with the neck and jaw but does have increased pain in the jaw with a lot of chewing. She was exercising in the pool ~ 2 times/week.   PERTINENT HISTORY:  neck pain on the off in the past 3-4 months but she as had pain in the neck over the past several years. She has had some pain in the Rt TMJ over the years as well. She has an overbite and jaw is tilted. She has seen a chiropractor in the past. She now has difficulty chewing and has not chewed in the past 2 weeks Chronic pain in the TMJ and neck; costochondritis; arthritis; osteoarthritis   Patient has been seeing podiatry for about 1 year. She had a shot for mortons neuroma which did not help. She also had a stress fracture of Rt 4th metatarsal and wore a boot and had physical therapy. She has also had back issues for "decades" on and off and now MD and DPM believe foot pain may be coming from her back. 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 Pt states pain only "occasionally" goes down her leg, mostly pain is just in her Rt foot and toes and in her back. Pain in back and foot increases with prolonged standing and walking (20-30 minutes)  PAIN:  Are you having pain? Yes: NPRS scale: 0-3/10 Pain location: Lt side of jaw to neck and head; thoracic spine/ ribs Pain description: ache Are you having pain? Yes: NPRS scale: 0-6/10 Pain location: Rt lower back Pain description: tightness; aching Aggravating factors: walking more than a mile Relieving factors: stretches   PRECAUTIONS: None  WEIGHT BEARING RESTRICTIONS: No  FALLS:  Has patient fallen in last 6 months? No   OCCUPATION: retired - household chores; walking 30-60 min 6 days/wk   PATIENT GOALS: strengthen areas to help decrease pain   NEXT MD VISIT: None scheduled  OBJECTIVE:   DIAGNOSTIC FINDINGS:  Xray 05/10/22: No fracture, dislocation or subluxation. No spondylolisthesis. No osteolytic or osteoblastic changes. Prevertebral and cervical cranial soft tissues are unremarkable. Degenerative disc disease noted with disc space narrowing and marginal osteophytes at C5-6 through C7-T1.  Lumbar MRI shows mild degenerative changes  PATIENT SURVEYS:  FOTO 47 goal 56 11/29/22 - 57  POSTURE:  Pt stands with Rt foot in supination Pt reports some symptom relief in foot with trunk extension  Patient presents with head forward posture with increased thoracic kyphosis; shoulders rounded and elevated; scapulae abducted and rotated along the thoracic spine; head of the humerus anterior in orientation.  PALPATION: Lumbar jt mobility WFL for CPAs and UPAs, no TTP lumbar or hip musculature  Muscular tightness ant/lat/posterior cervical musculature; TMD/jaw area Lt > Rt with tenderness into the Lt temple and scalp  10/04/22: persistent muscular tightness ant/lat/posterior cervical musculature; TMD/jaw area Lt > Rt with tenderness into the Lt temple and  scalp  CERVICAL ROM: tight end ranges   Active ROM AROM (deg) eval AROM  10/04/22 AROM 11/29/22  Flexion 46 47 46  Extension 45 52 40  Right lateral flexion 27 30 27   Left lateral flexion 39 40 38  Right rotation 60 40 45  Left rotation 49 40 47   (Blank rows = not tested)   JAW: TMD opening and lateral deviation limited   10/04/22: Good progress with increasing mobility/ROM in opening and lateral deviation   UPPER LIMB TENSION TEST 10/19/22 - mild tightness Lt UE   LOWER EXTREMITY MMT:    MMT Right eval Left eval  Hip flexion 4+ 4+  Hip extension    Hip abduction 4+ 4+  Hip adduction    Hip internal rotation    Hip external rotation    Knee flexion    Knee extension    Ankle dorsiflexion 3   Ankle plantarflexion 3   Ankle inversion 3   Ankle eversion 3    (Blank rows = not tested)  LUMBAR ROM:   Active  A/PROM  eval AROM 10/31/22 AROM 11/29/22  Flexion Limited 25% Limited  20% Limited 15%  Extension [facility] WFL  Right lateral flexion Limited 50% Limited  25% Limited  25%  Left lateral flexion Limited 50% Limited  25% Limited  25%  Right rotation Limited 50% Limited 50% Limited  50%  Left rotation Limited 50% Limited  50% Limited  50%   (Blank rows = not tested) 10/31/22:  Rt PSIS slightly lower than Lt - posture otherwise WFL's    SLS Rt: 1 second  10/04/22: SLS Rt 8 sec; Lt 15 sec    DATE: 11/29/22: Headache/TMJ  Scap squeeze standing w/noodle  Wall angel standing with noodle  Pelvic tilt supine  Trunk rotation supine 10 sec x 5   Manual:  STM through cervical and upper trap; TMJ STM Rt UE  Trigger Point Dry-Needling  Treatment instructions: Expect mild to moderate muscle soreness. S/S of pneumothorax if dry needled over a lung field, and to seek immediate medical attention should they occur. Patient verbalized understanding of these instructions and education.  Patient Consent Given: Yes Education handout provided: Previously  provided Muscles treated: R extensor forearm  Electrical stimulation performed: No Parameters: N/A Treatment response/outcome: decreased palpable tightness   Modalities Moist heat cervical and thoracic area x 8 min                                                                                                                            Self Care:  Small grip with sponge   Occipital inhibition golf balls ~ 1-2 min (HEP - using soft balls from the dollar store)   DATE: 11/14/22: Headache/TMJ Nustep L4 x 4 min  Lat stretch supine 45 sec x 3  Trunk rotation supine 10 sec x 5  Hip adductor stretch with strap 30 sec x 3 Rt/Lt  Sitting lateral trunk stretch 30 sec x 3 Rt/Lt  Sitting trunk rotation 10 sec x 3 Rt/Lt  Piriformis stretch supine travell 30 sec x 3 Hip flexor stretch  supine thomas 20 sec x 1   Manual:   Modalities Moist heat cervical and thoracic area x 8 min                                                                                                                            Self Care:  Occipital inhibition golf balls ~ 1-2 min (HEP - using soft balls from the dollar store)   [facility] Adult PT Treatment:                                                 DATE: 10/31/22: Headache/TMJ Hip flexor stretch supine thomas 30 sec x 3  Prone press up   Manual:  Soft tissue mobilization; deep tissue work through the lumbar musculature Working through the Rt lumbar to posterior hip  PA mobs Rt greater trochanter   Modalities Moist heat lumbar to Rt posterior hip x 10 min   [facility] Adult PT Treatment:  (dry needling)                                               DATE: 10/04/22: Headache/TMJ UBE L4 x 4 min 2 min fwd/2 min back   Doorway stretch 3 positions 30 sec x 2  Scap squeeze with noodle 5 sec x 10  L's with yellow TB x 20  W's yellow TB x 20 Wall angel x 20 prone scap squeeze with axial extension 5 sec x 5  Manual:  Soft tissue mobilization; deep tissue work through the  ant/lat/post cervical musculature; pecs; upper trap; scalp  Trigger Point Dry-Needling  Treatment instructions: Expect mild to moderate muscle soreness. S/S of pneumothorax if dry needled over a lung field, and to seek immediate medical attention should they occur. Patient verbalized understanding of these instructions and education.  Patient Consent Given: Yes Education handout provided: Previously provided Muscles treated: bilat suboccipitals; SCM; cervical paraspinals; acupuncture point at medial brow line   Electrical stimulation performed: No Parameters:  Treatment response/outcome:   Modalities Moist heat cervical and thoracic area x 10 min   PATIENT EDUCATION:  Education details: POC; HEP  Person educated: Patient Education method: Programmer, multimedia, Demonstration, Tactile cues, Verbal cues, and Handouts Education comprehension: verbalized understanding, returned demonstration, verbal cues required, tactile cues required, and needs further education  HOME EXERCISE PROGRAM: Access Code: [email or web address] URL: [email or web address] Date: 10/19/2022 Prepared by: Corlis Leak  Program Notes lying on backtuck chin (keep chin tucked thorugh entire exercise)slide right ear toward right shoulderturn chin to right bring left shoulder out to rest on bed with shoulder at 90 degreesstraighten elbowpoint finger toward floor hold 1 min 2 reps 2times per day   Exercises - Supine Cervical Retraction with  Towel  - 2 x daily - 7 x weekly - 1 sets - 5-10 reps - 10 sec  hold - Standing Cervical Retraction  - 2 x daily - 7 x weekly - 1 sets - 10 reps - 5-10 sec  hold - Seated Scapular Retraction  - 2 x daily - 7 x weekly - 1-2 sets - 10 reps - 10 sec  hold - Shoulder External Rotation and Scapular Retraction  - 3 x daily - 7 x weekly - 1 sets - 10 reps - 3-5 sec   hold - Shoulder External Rotation in 45 Degrees Abduction  - 2 x daily - 7 x weekly - 1-2 sets - 10 reps - 3 sec  hold - Shoulder  External Rotation and Scapular Retraction with Resistance  - 2 x daily - 7 x weekly - 1 sets - 10 reps - 3-5 sec  hold - Shoulder W - External Rotation with Resistance  - 2 x daily - 7 x weekly - 1-2 sets - 10 reps - 3 sec  hold - Supine Scapular Retraction  - 2 x daily - 7 x weekly - 1 sets - 10 reps - 5-10 sec  hold - Standing Bilateral Low Shoulder Row with Anchored Resistance  - 2 x daily - 7 x weekly - 1-3 sets - 10 reps - 2-3 sec  hold - Drawing Bow  - 1 x daily - 7 x weekly - 1 sets - 10 reps - 3 sec  hold - Standing High Row with Resistance  - 1 x daily - 7 x weekly - 1 sets - 10 reps - 3-5 sec  hold - Cobra  - 1 x daily - 7 x weekly - 1 sets - 10 reps - 10 seconds hold - Heel Raises with Counter Support  - 1 x daily - 7 x weekly - 3 sets - 10 reps - Heel Toe Raises with Counter Support  - 1 x daily - 7 x weekly - 3 sets - 10 reps - Seated Ankle Eversion with Resistance  - 1 x daily - 7 x weekly - 3 sets - 10 reps - Seated Ankle Inversion with Resistance  - 1 x daily - 7 x weekly - 3 sets - 10 reps - Toe Yoga - Alternating Great Toe and Lesser Toe Extension  - 1 x daily - 7 x weekly - 3 sets - 10 reps - Anti-Rotation Lateral Stepping with Press  - 1 x daily - 7 x weekly - 1-2 sets - 10 reps - 2-3 sec  hold - Single Leg Stance  - 1 x daily - 7 x weekly - 2 sets - 5 reps - 10-20 sec hold - Seated Hip Internal Rotation with Ball and Resistance  - 2 x daily - 7 x weekly - 1 sets - 10 reps - 3 sec  hold - Sidelying Reverse Clamshell  - 1 x daily - 7 x weekly - 3 sets - 10 reps - Clamshell  - 1 x daily - 7 x weekly - 3 sets - 10 reps - Sidelying Diagonal Hip Abduction  - 1 x daily - 7 x weekly - 3 sets - 10 reps - Marching with Resistance  - 1 x daily - 7 x weekly - 3 sets - 10 reps - Step Up  - 1 x daily - 7 x weekly - 2-3 sets - 10 reps - 2 sec  hold - Standing Hip Extension with Resistance at  Ankles and Unilateral Counter Support  - 1 x daily - 7 x weekly - 2-3 sets - 10 reps - 3 sec   hold - Standing Hip Abduction with Resistance at Ankles and Counter Support  - 1 x daily - 7 x weekly - 2-3 sets - 10 reps - 3 sec  hold - Side Stepping with Resistance at Feet  - 1 x daily - 7 x weekly - Mini Squat with Counter Support  - 2 x daily - 7 x weekly - 1-2 sets - 10 reps - 3 sec  hold - The Diver  - 2 x daily - 7 x weekly - 1 sets - 10 reps - 2-3 sec  hold - Single Leg Balance with Eyes Closed  - 2 x daily - 7 x weekly - 1 sets - 3 reps - 20-30 sec  hold - Romberg Stance Eyes Closed on Foam Pad  - 2 x daily - 7 x weekly - 1 sets - 3 reps - 30 sec  hold - Standing with Head Rotation  - 2 x daily - 7 x weekly - 1 sets - 3 reps - 20-30 sec  hold - Half Tandem Stance Balance with Eyes Closed  - 2 x daily - 7 x weekly - 1 sets - 3 reps - 2-30 sec  hold - Forward T with Counter Support  - 1 x daily - 7 x weekly - 1-3 sets - 10 reps - Standing Lat Pull Down with Resistance - Elbows Bent  - 2 x daily - 7 x weekly - 1 sets - 10 reps - 3 sec  hold  ASSESSMENT:  CLINICAL IMPRESSION: Lizvette reports some intermittent pain in neck, shoulder, arm as well as into the Rt lumbar spine to posterior hip. Patient is independent in HEP and will continue with program. Goals were partially accomplished. Patient will continue with independent HEP    OBJECTIVE IMPAIRMENTS: pain in the neck and jaw as well as into the shoulder area Lt > Rt. Patient presents with poor posture and alignment; limited cervical mobility and ROM; discomfort with TMD opening and lateral deviation; muscular tightness to palpation through the ant/lat/posterior cervical musculature and jaw areas; weakness in postural musculature of upper core; pain with functional activities including chewing.decreased mobility, decreased ROM, decreased strength, hypomobility, increased fascial restrictions, increased muscle spasms, impaired flexibility, improper body mechanics, postural dysfunction, and pain.    GOALS: Goals reviewed with patient?  Yes  SHORT TERM GOALS: Target date: 08/28/2022  Independent in initial HEP  Baseline:  Goal status: met  2.  Instruct patient in improved posture and alignment with functional activities and sitting/standing  Baseline:  Goal status: met   LONG TERM GOALS: Target date: 11/29/2022  Increase cervical mobility and ROM to WNL's throughout with decreased stiffness with ROM  Baseline:  Goal status: partially met    2.  Improve posture and alignment with patient to demonstrate improved upright posture with posterior shoulder girdle musculature engaged  Baseline:  Goal status: partially met  3.  Improve TMD opening with patient to report ability chew foods of normal textures  Baseline:  Goal status: partially met    4.  Decrease pain in neck and jaw by 50-75% allowing patient to exercise and perform normal functional activities  Baseline:  Goal status: met   5.  Independent in HEP including aquatic program as indicated  Baseline:  Goal status: met  6.  Improve functional limitation score to 56  Baseline:  Goal status: INITIAL  7.  Pt will improve Rt ankle strength to 4+/5 to improve standing and walking tolerance Baseline:  Goal status: met  8.  Pt will improve Rt SLS to >= 15 seconds to demo improved balance Baseline:  Goal status: on going      PLAN:  PT FREQUENCY: 2x/week  PT DURATION: 8 weeks  PLANNED INTERVENTIONS: Therapeutic exercises, Therapeutic activity, Neuromuscular re-education, Patient/Family education, Self Care, Joint mobilization, Aquatic Therapy, Dry Needling, Electrical stimulation, Spinal mobilization, Cryotherapy, Moist heat, Taping, Traction, Ultrasound, Ionotophoresis 4mg /ml Dexamethasone, Manual therapy, and Re-evaluation  PLAN FOR NEXT SESSION: DN prox R HS, eccentric strengthening; ankle and hip strength and balance, core strength. review and progress exercise; continue with postural correction and eduction; manual work, DN, modalities as  indicated    [employer], PT 11/29/2022, 9:40 AM

## 2022-12-14 ENCOUNTER — Ambulatory Visit (INDEPENDENT_AMBULATORY_CARE_PROVIDER_SITE_OTHER): Payer: PPO | Admitting: Family Medicine

## 2022-12-14 VITALS — BP 134/88 | HR 81 | Ht 66.0 in | Wt 134.0 lb

## 2022-12-14 DIAGNOSIS — R790 Abnormal level of blood mineral: Secondary | ICD-10-CM | POA: Diagnosis not present

## 2022-12-14 DIAGNOSIS — R052 Subacute cough: Secondary | ICD-10-CM

## 2022-12-14 NOTE — Progress Notes (Signed)
She reports that she has been experiencing a dry cough for the past 1.5 - 2 months. She said the cough has been dry/non-productive

## 2022-12-14 NOTE — Progress Notes (Signed)
Acute Office Visit  Subjective:     Patient ID: Sheila Harris, female    DOB: 11-17-1955, 67 y.o.   MRN: [id number]  Chief Complaint  Patient presents with   Cough    HPI Patient is in today for Cough x 6 weeks.  It is mosty during the day. Triggered more by talking.  She has been having significant dental work including a bridge on her lower teeth done.  She has a significant overbite so since having in the temporaries and then the final bridge over the last week she has had significant discomfort at the base of her tongue she feels like it is tight and crowded and wonders if that could even be triggering her cough somewhat.  She has noticed that chewing a little bit of sugar-free, gum can be helpful.  Nights she wears a guard in her mouth and says that she does well and does not cough at all.  It is mostly during the day.  She has not had any sore throat but sometimes just feels a little dry or itchy.  No recent GERD symptoms.  She can swallow normally.  No shortness of breath.  ROS      Objective:    BP 134/88   Pulse 81   Ht 5\' 6"  (1.676 m)   Wt 134 lb (60.8 kg)   LMP 04/03/2010   SpO2 97%   BMI 21.63 kg/m    Physical Exam Constitutional:      Appearance: She is well-developed.  HENT:     Head: Normocephalic and atraumatic.     Right Ear: External ear normal.     Left Ear: External ear normal.     Nose: Nose normal.  Eyes:     Conjunctiva/sclera: Conjunctivae normal.     Pupils: Pupils are equal, round, and reactive to light.  Neck:     Thyroid: No thyromegaly.  Cardiovascular:     Rate and Rhythm: Normal rate and regular rhythm.     Heart sounds: Normal heart sounds.  Pulmonary:     Effort: Pulmonary effort is normal.     Breath sounds: Normal breath sounds. No wheezing.  Musculoskeletal:     Cervical back: Neck supple.  Lymphadenopathy:     Cervical: No cervical adenopathy.  Skin:    General: Skin is warm and dry.  Neurological:     Mental Status:  She is alert and oriented to person, place, and time.     No results found for any visits on 12/14/22.      Assessment & Plan:   Problem List Items Addressed This Visit   None Visit Diagnoses     Low ferritin    -  Primary   Relevant Orders   Fe+TIBC+Fer   Subacute cough          Subacute cough -we discussed treatment options including treating for silent GERD and treating for postnasal drip.  Recommended trial of a PPI for 14 days and a trial of Astelin or Astepro for 10 days.  If not improving will refer to ENT.  In the meantime she is gena try to get back in to have them see if they can shave down that area on the back of her implant and see if that makes a difference she says it tends to also be associated with her tongue feels sore from rubbing on the back and is feels like there is a disc underneath her teeth.  So they can  make some adjustments there that might also provide some relief as well.  Also keeping the throat moist can be helpful.  She has noticed that drinking sugar-free gum helps somewhat.   No orders of the defined types were placed in this encounter.   No follow-ups on file.  [name], MD

## 2022-12-14 NOTE — Patient Instructions (Addendum)
Can try prilosec or Nexium daily about minutes before first meal of the day for 14 days.  If it is not helpful in reducing her cough symptoms then please stop.  You can also try over-the-counter Astelin or Astepro to help with postnasal drip to see if this reduces the cough symptoms as well.

## 2022-12-15 ENCOUNTER — Encounter: Payer: Self-pay | Admitting: Family Medicine

## 2022-12-15 DIAGNOSIS — R479 Unspecified speech disturbances: Secondary | ICD-10-CM

## 2022-12-20 ENCOUNTER — Encounter: Payer: Self-pay | Admitting: Family Medicine

## 2022-12-26 ENCOUNTER — Other Ambulatory Visit: Payer: Self-pay | Admitting: Family Medicine

## 2022-12-27 ENCOUNTER — Encounter: Payer: Self-pay | Admitting: Speech Pathology

## 2022-12-27 ENCOUNTER — Ambulatory Visit: Payer: PPO | Attending: Family Medicine | Admitting: Speech Pathology

## 2022-12-27 DIAGNOSIS — R498 Other voice and resonance disorders: Secondary | ICD-10-CM | POA: Diagnosis not present

## 2022-12-27 DIAGNOSIS — R4789 Other speech disturbances: Secondary | ICD-10-CM | POA: Insufficient documentation

## 2022-12-27 DIAGNOSIS — R479 Unspecified speech disturbances: Secondary | ICD-10-CM | POA: Insufficient documentation
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
## 2022-12-28 ENCOUNTER — Encounter: Payer: Self-pay | Admitting: Speech Pathology

## 2022-12-29 ENCOUNTER — Encounter: Payer: Self-pay | Admitting: Family Medicine

## 2022-12-29 DIAGNOSIS — F419 Anxiety disorder, unspecified: Secondary | ICD-10-CM

## 2022-12-29 MED ORDER — ESCITALOPRAM OXALATE 10 MG PO TABS: ORAL_TABLET | ORAL | 0 refills | Status: DC

## 2023-01-01 NOTE — Addendum Note (Signed)
Addended by: Chalmers Cater on: 01/01/2023 02:33 PM   Modules accepted: Orders

## 2023-01-01 NOTE — Addendum Note (Signed)
Addended by: [name] D on: 01/01/2023 04:30 PM   Modules accepted: Orders

## 2023-01-01 NOTE — Telephone Encounter (Signed)
Orders Placed This Encounter  Procedures   Ambulatory referral to Behavioral Health    Referral Priority:   Routine    Referral Type:   Psychiatric    Referral Reason:   Specialty Services Required    Requested Specialty:   Behavioral Health    Number of Visits Requested:   1    

## 2023-01-03 ENCOUNTER — Ambulatory Visit: Payer: PPO

## 2023-01-03 DIAGNOSIS — R4789 Other speech disturbances: Secondary | ICD-10-CM

## 2023-01-03 DIAGNOSIS — M9906 Segmental and somatic dysfunction of lower extremity: Secondary | ICD-10-CM | POA: Diagnosis not present

## 2023-01-03 DIAGNOSIS — R498 Other voice and resonance disorders: Secondary | ICD-10-CM

## 2023-01-03 DIAGNOSIS — M810 Age-related osteoporosis without current pathological fracture: Secondary | ICD-10-CM | POA: Diagnosis not present

## 2023-01-03 DIAGNOSIS — R6884 Jaw pain: Secondary | ICD-10-CM | POA: Diagnosis not present

## 2023-01-03 DIAGNOSIS — M9900 Segmental and somatic dysfunction of head region: Secondary | ICD-10-CM | POA: Diagnosis not present

## 2023-01-03 DIAGNOSIS — M542 Cervicalgia: Secondary | ICD-10-CM | POA: Diagnosis not present

## 2023-01-03 DIAGNOSIS — M9901 Segmental and somatic dysfunction of cervical region: Secondary | ICD-10-CM | POA: Diagnosis not present

## 2023-01-03 DIAGNOSIS — M79662 Pain in left lower leg: Secondary | ICD-10-CM | POA: Diagnosis not present

## 2023-01-03 NOTE — Patient Instructions (Signed)
/  s/ and /z/ words  S [email or web address]  Z [email or web address]
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
## 2023-01-05 ENCOUNTER — Other Ambulatory Visit: Payer: Self-pay | Admitting: Family Medicine

## 2023-01-05 ENCOUNTER — Other Ambulatory Visit: Payer: Self-pay | Admitting: Sports Medicine

## 2023-01-05 DIAGNOSIS — F419 Anxiety disorder, unspecified: Secondary | ICD-10-CM

## 2023-01-05 DIAGNOSIS — M5412 Radiculopathy, cervical region: Secondary | ICD-10-CM

## 2023-01-10 ENCOUNTER — Encounter: Payer: Self-pay | Admitting: Family Medicine

## 2023-01-10 ENCOUNTER — Other Ambulatory Visit: Payer: Self-pay | Admitting: Family Medicine

## 2023-01-10 ENCOUNTER — Ambulatory Visit: Payer: PPO

## 2023-01-10 DIAGNOSIS — Z1231 Encounter for screening mammogram for malignant neoplasm of breast: Secondary | ICD-10-CM

## 2023-01-11 ENCOUNTER — Ambulatory Visit (INDEPENDENT_AMBULATORY_CARE_PROVIDER_SITE_OTHER): Payer: PPO | Admitting: Family Medicine

## 2023-01-11 ENCOUNTER — Ambulatory Visit: Payer: PPO | Admitting: Professional

## 2023-01-11 ENCOUNTER — Encounter: Payer: Self-pay | Admitting: Professional

## 2023-01-11 ENCOUNTER — Ambulatory Visit: Payer: PPO | Admitting: Family Medicine

## 2023-01-11 ENCOUNTER — Encounter: Payer: Self-pay | Admitting: Family Medicine

## 2023-01-11 VITALS — BP 122/77 | HR 74 | Resp 16 | Ht 66.0 in | Wt 135.0 lb

## 2023-01-11 DIAGNOSIS — F419 Anxiety disorder, unspecified: Secondary | ICD-10-CM | POA: Diagnosis not present

## 2023-01-11 DIAGNOSIS — K146 Glossodynia: Secondary | ICD-10-CM

## 2023-01-11 MED ORDER — GABAPENTIN 100 MG PO CAPS
100.0000 mg | ORAL_CAPSULE | Freq: Three times a day (TID) | ORAL | 0 refills | Status: DC | PRN
Start: 2023-01-11 — End: 2023-08-23

## 2023-01-11 NOTE — Progress Notes (Signed)
   Acute Office Visit  Subjective:     Patient ID: Sheila Harris, female    DOB: Dec 20, 1955, 67 y.o.   MRN: [id number]  Chief Complaint  Patient presents with   tongue pain    Patient states she had dental work on 10/18/22. She states this is when the pain started.    HPI Patient is in today for tongue pain.  She is getting a lot of pain at the base of the tongue and anteriorly.  They are going to work on getting a new mouthpiece and bridge and start all over.  She is been working with her dentist and they have even shaved down part of it and they still cannot get it comfortable and feeling well.  She has a pretty large overbite and so it does create some crowding for her tongue.  She is also had an intermittent cough since she had the dental work done.  ROS      Objective:    BP 122/77 (BP Location: Left Arm, Patient Position: Sitting, Cuff Size: Normal)   Pulse 74   Resp 16   Ht 5\' 6"  (1.676 m)   Wt 135 lb (61.2 kg)   LMP 04/03/2010   SpO2 97%   BMI 21.79 kg/m    Physical Exam Vitals and nursing note reviewed.  Constitutional:      Appearance: She is well-developed.  HENT:     Head: Normocephalic and atraumatic.  Cardiovascular:     Rate and Rhythm: Normal rate and regular rhythm.     Heart sounds: Normal heart sounds.  Pulmonary:     Effort: Pulmonary effort is normal.     Breath sounds: Normal breath sounds.  Skin:    General: Skin is warm and dry.  Neurological:     Mental Status: She is alert and oriented to person, place, and time.  Psychiatric:        Behavior: Behavior normal.     No results found for any visits on 01/11/23.      Assessment & Plan:   Problem List Items Addressed This Visit   None Visit Diagnoses     Tongue pain    -  Primary      We discussed options.  She has a friend that is been using some dexamethasone and wonders if that would be helpful for her she does not really have any ulcerations so I think maybe starting with the  gabapentin more for the nerve pain and the throbbing would be better.  If that still not helpful then we can certainly consider it.  But I do think that works better for mouth ulcers.  She has been using a little Balko magnesia on it and that tends to help a little bit.  We also discussed using the dental wax to provide a little bit of a barrier for the rough area on the back.  Meds ordered this encounter  Medications   gabapentin (NEURONTIN) 100 MG capsule    Sig: Take 1 capsule (100 mg total) by mouth 3 (three) times daily as needed.    Dispense:  90 capsule    Refill:  0    No follow-ups on file.  I spent 20 minutes on the day of the encounter to include pre-visit record review, face-to-face time with the patient and post visit ordering of test.   [name], MD
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
## 2023-01-11 NOTE — Progress Notes (Deleted)
° ° ° ° ° ° ° ° ° ° ° ° ° ° °  Kathy Collins, LCMHC °
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
## 2023-01-31 ENCOUNTER — Ambulatory Visit: Payer: PPO | Admitting: Professional

## 2023-02-05 ENCOUNTER — Ambulatory Visit: Payer: PPO | Admitting: Professional

## 2023-02-05 DIAGNOSIS — H35453 Secondary pigmentary degeneration, bilateral: Secondary | ICD-10-CM | POA: Diagnosis not present

## 2023-02-05 DIAGNOSIS — H35723 Serous detachment of retinal pigment epithelium, bilateral: Secondary | ICD-10-CM | POA: Diagnosis not present

## 2023-02-05 DIAGNOSIS — H25813 Combined forms of age-related cataract, bilateral: Secondary | ICD-10-CM | POA: Diagnosis not present

## 2023-02-05 DIAGNOSIS — H35363 Drusen (degenerative) of macula, bilateral: Secondary | ICD-10-CM | POA: Diagnosis not present

## 2023-02-05 DIAGNOSIS — H353132 Nonexudative age-related macular degeneration, bilateral, intermediate dry stage: Secondary | ICD-10-CM | POA: Diagnosis not present

## 2023-02-06 ENCOUNTER — Ambulatory Visit: Payer: PPO

## 2023-02-06 ENCOUNTER — Encounter: Payer: Self-pay | Admitting: Family Medicine

## 2023-02-07 ENCOUNTER — Other Ambulatory Visit: Payer: Self-pay | Admitting: *Deleted

## 2023-02-07 DIAGNOSIS — R790 Abnormal level of blood mineral: Secondary | ICD-10-CM

## 2023-02-08 LAB — IRON,TIBC AND FERRITIN PANEL
Ferritin: 34 ng/mL (ref 15–150)
Iron Saturation: 25 % (ref 15–55)
Iron: 79 ug/dL (ref 27–139)
Total Iron Binding Capacity: 316 ug/dL (ref 250–450)
UIBC: 237 ug/dL (ref 118–369)

## 2023-02-08 NOTE — Progress Notes (Signed)
Hi Courney, iron stores are looking better.  That was 15, then 16 it is now up to 34 which is fantastic.  So lets keep going with your supplement I would like to get your ferritin above 40 so lets plan to recheck again in 3 to 4 months.  Also I sent you a separate note in regards to the drusen.

## 2023-02-13 ENCOUNTER — Ambulatory Visit: Payer: PPO

## 2023-02-13 MED ORDER — FLUOXETINE HCL 20 MG PO TABS: ORAL_TABLET | ORAL | 0 refills | Status: DC

## 2023-02-13 NOTE — Addendum Note (Signed)
Addended by: [name] D on: 02/13/2023 12:50 PM   Modules accepted: Orders

## 2023-02-14 ENCOUNTER — Ambulatory Visit: Payer: PPO

## 2023-02-15 ENCOUNTER — Ambulatory Visit: Payer: PPO | Admitting: Professional

## 2023-02-15 ENCOUNTER — Encounter: Payer: Self-pay | Admitting: Professional

## 2023-02-15 DIAGNOSIS — F419 Anxiety disorder, unspecified: Secondary | ICD-10-CM | POA: Diagnosis not present

## 2023-02-15 NOTE — Progress Notes (Addendum)
[facility] Counselor/Therapist Progress Note  Patient ID: Sheila Harris, MRN: [id number],    Date: 02/15/2023  Time Spent: 51 minutes 1101-1152am   Treatment Type: Individual Therapy  Risk Assessment: Danger to Self:  No Self-injurious Behavior: No Danger to Others: No  Subjective: The patient arrived on time for her in person appointment.  Issues addressed: 1-The patient discussed her ongoing dental issues -she feels better since the new bridge was placed two weeks ago -she feels angry with dental practice for trying to push her through -pt is upset about the work done on her upper teeth as resulted in her mouthguard not fitting   -she complained and they are making her an in house -feels encouraged and things are getting better with the placement of the lower bridge -macular degeneration -retinologist recommended Prozac to slow down the issues 2-interactions with sister Sheila Harris -she is in the 37's -her sister told her she was selfish and self-centered -pt didn't understand why her sister was being so critical of her -pt took some time and then went to her and discussed how she was helping their mother   -pt brought up that Sheila Harris was not available and Sheila Harris became defensive 3-treatment planning -pt wants "to feel better, not be so discouraged about how she is gong to feel" -patient and Clinician completed development of her treatment plan -patient fully participated and is in agreement with plan as written  Treatment Plan Client Abilities/Strengths  Supportive Relationships, Hopefulness, and Self-Advocate  Client Treatment Preferences   Problems Addressed  Anxiety, Childhood Trauma, Financial Stress, Medical Issues  Goals 1. Accept emotional support from those who care, without pushing them away in anger. Objective Family members share with each other feelings that are triggered by the client's medical condition. Target Date: 2024-02-15 Frequency:  Biweekly  Progress: 0 Modality: individual  Objective Identify feelings associated with the medical condition. Target Date: 2024-02-15 Frequency: Biweekly  Progress: 0 Modality: individual  Related Interventions Assist the client in identifying, sorting through, and verbalizing the various feelings generated by his/her medical condition. Objective Verbalize acceptance of the reality of the medical condition and the need for treatment. Target Date: 2024-02-15 Frequency: Biweekly  Progress: 0 Modality: individual  Related Interventions Explore and process the client's fears associated with medical treatment, deterioration of physical health, and subsequent death (or assign "How I Feel About My Medical Treatment" in the Adult Psychotherapy Homework Planner by Stephannie Li). Assess the client for and treat his/her depression and anxiety (see the Unipolar Depression and Anxiety chapters in this Planner). 2. Accept the illness, and adapt life to the necessary limitations. 3. Become as knowledgeable as possible about the diagnosed condition and about living as normally as possible. 4. Enhance ability to effectively cope with the full variety of life's worries and anxieties. 5. Learn and implement coping skills that result in a reduction of anxiety and worry, and improved daily functioning. 6. Let go of blame and begin to forgive others for pain caused in childhood. Objective Identify patterns of abuse, neglect, or abandonment within the family of origin, both current and historical, nuclear and extended. Target Date: 2024-02-15 Frequency: Biweekly  Progress: 0 Modality: individual  Related Interventions Explore the client's painful childhood experiences (or assign "Share the Painful Memory" in the Adult Psychotherapy Homework Planner by Stephannie Li). 7. New Goal Statement for Financial Stress Objective Describe the details of the current financial situation. Target Date: 2024-02-15 Frequency: Biweekly   Progress: 0 Modality: individual  Related Interventions Provide the client a  supportive, nonjudgmental environment by being empathetic, warm, and sensitive to the fact that the topic may elicit guilt, shame, and embarrassment. 8. Stabilize anxiety level while increasing ability to function on a daily basis. Objective Learn and implement calming skills to reduce overall anxiety and manage anxiety symptoms. Target Date: 2024-02-15 Frequency: Biweekly  Progress: 0 Modality: individual  Related Interventions Teach the client calming/relaxation skills (e.g., applied relaxation, progressive muscle relaxation, cue controlled relaxation; mindful breathing; biofeedback) and how to discriminate better between relaxation and tension; teach the client how to apply these skills to his/her daily life (e.g., New Directions in Progressive Muscle Relaxation by Marcelyn Ditty, and Hazlett-Stevens; Treating Generalized Anxiety Disorder by Rygh and Ida Rogue). Assign the client to read about progressive muscle relaxation and other calming strategies in relevant books or treatment manuals (e.g., Progressive Relaxation Training by Robb Matar and Alen Blew; Mastery of Your Anxiety and Worry: Workbook by Earlie Counts). Objective Identify, challenge, and replace biased, fearful self-talk with positive, realistic, and empowering self-talk. Target Date: 2024-02-15 Frequency: Biweekly  Progress: 0 Modality: individual  Related Interventions Explore the client's schema and self-talk that mediate his/her fear response; assist him/her in challenging the biases; replace the distorted messages with reality-based alternatives and positive, realistic self-talk that will increase his/her self-confidence in coping with irrational fears (see Cognitive Therapy of Anxiety Disorders by Laurence Slate). Objective Learn and implement personal and interpersonal skills to reduce anxiety and improve interpersonal  relationships. Target Date: 2024-02-15 Frequency: Biweekly  Progress: 0 Modality: individual  Related Interventions Use instruction, modeling, and role-playing to build the client's general social, communication, and/or conflict resolution skills. Objective Learn to accept limitations in life and commit to tolerating, rather than avoiding, unpleasant emotions while accomplishing meaningful goals. Target Date: 2024-02-15 Frequency: Biweekly  Progress: 0 Modality: individual  Related Interventions Use techniques from Acceptance and Commitment Therapy to help client accept uncomfortable realities such as lack of complete control, imperfections, and uncertainty and tolerate unpleasant emotions and thoughts in order to accomplish value-consistent goals. Objective Describe situations, thoughts, feelings, and actions associated with anxieties and worries, their impact on functioning, and attempts to resolve them. Target Date: 2024-02-15 Frequency: Biweekly  Progress: 0 Modality: individual  Related Interventions Ask the client to describe his/her past experiences of anxiety and their impact on functioning; assess the focus, excessiveness, and uncontrollability of the worry and the type, frequency, intensity, and duration of his/her anxiety symptoms (consider using a structured interview such as The Anxiety Disorders Interview Schedule-Adult Version).  Diagnosis:Anxiety disorder, unspecified type  Plan:  -next session is Wednesday, March 07, 2023 at [facility] virtual

## 2023-02-15 NOTE — Progress Notes (Signed)
° ° ° ° ° ° ° ° ° ° ° ° ° ° °  Kathy Collins, LCMHC °

## 2023-02-20 ENCOUNTER — Ambulatory Visit: Payer: PPO

## 2023-02-20 DIAGNOSIS — L853 Xerosis cutis: Secondary | ICD-10-CM | POA: Diagnosis not present

## 2023-02-20 DIAGNOSIS — L814 Other melanin hyperpigmentation: Secondary | ICD-10-CM | POA: Diagnosis not present

## 2023-02-20 DIAGNOSIS — D225 Melanocytic nevi of trunk: Secondary | ICD-10-CM | POA: Diagnosis not present

## 2023-02-20 DIAGNOSIS — L821 Other seborrheic keratosis: Secondary | ICD-10-CM | POA: Diagnosis not present

## 2023-02-21 ENCOUNTER — Ambulatory Visit: Payer: PPO

## 2023-02-22 ENCOUNTER — Ambulatory Visit: Payer: PPO

## 2023-03-05 MED ORDER — FLUOXETINE HCL 20 MG PO TABS: ORAL_TABLET | ORAL | 0 refills | Status: DC

## 2023-03-05 NOTE — Addendum Note (Signed)
Addended by: Chalmers Cater on: 03/05/2023 11:05 AM   Modules accepted: Orders

## 2023-03-07 ENCOUNTER — Ambulatory Visit: Payer: PPO | Admitting: Professional
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
## 2023-03-12 ENCOUNTER — Ambulatory Visit: Payer: PPO

## 2023-03-12 ENCOUNTER — Ambulatory Visit (INDEPENDENT_AMBULATORY_CARE_PROVIDER_SITE_OTHER): Payer: PPO | Admitting: Family Medicine

## 2023-03-12 ENCOUNTER — Encounter: Payer: Self-pay | Admitting: Family Medicine

## 2023-03-12 VITALS — BP 117/57 | HR 79 | Ht 66.0 in | Wt 135.0 lb

## 2023-03-12 DIAGNOSIS — F411 Generalized anxiety disorder: Secondary | ICD-10-CM | POA: Diagnosis not present

## 2023-03-12 DIAGNOSIS — S99921A Unspecified injury of right foot, initial encounter: Secondary | ICD-10-CM | POA: Diagnosis not present

## 2023-03-12 DIAGNOSIS — M5030 Other cervical disc degeneration, unspecified cervical region: Secondary | ICD-10-CM | POA: Diagnosis not present

## 2023-03-12 DIAGNOSIS — M47812 Spondylosis without myelopathy or radiculopathy, cervical region: Secondary | ICD-10-CM | POA: Diagnosis not present

## 2023-03-12 DIAGNOSIS — S90121A Contusion of right lesser toe(s) without damage to nail, initial encounter: Secondary | ICD-10-CM | POA: Diagnosis not present

## 2023-03-12 DIAGNOSIS — M546 Pain in thoracic spine: Secondary | ICD-10-CM | POA: Diagnosis not present

## 2023-03-12 NOTE — Progress Notes (Signed)
Acute Office Visit  Subjective:     Patient ID: Sheila Harris, female    DOB: [birth year], 67 y.o.   MRN: [id number]  Chief Complaint  Patient presents with   Toe Injury    HPI Patient is in today for injury to her 2nd toe on the right.  She said she was in the therapy pool on Friday and kicked the edge that is concrete.  She had significant bruising and discoloration of that second toe.  It is tender.  She has been buddy taping it and icing it.  She is worried she may have fractured it.  She said she had he r other foot 2 but has not had any lingering discomfort or bruising.  She  has weaned the Lexapro and has been on the fluoxetine 10 mg for a week now.  We had switched because her eye doctor had noted some particular benefits from Prozac in regards to decreasing progression of macular degeneration.  Actually was able to find an article about fluoxetine reducing inflammation that leads to dry age related macular degeneration.  ROS      Objective:    BP (!) 117/57   Pulse 79   Ht 5\' 6"  (1.676 m)   Wt 135 lb (61.2 kg)   LMP 04/03/2010   SpO2 99%   BMI 21.79 kg/m    Physical Exam  Second toe on right foot with significant bruising and erythema over the DIP.  Tender with lateral squeeze over the DIP joint as well she is able to flex and extend.  No pain on the rest of the foot.  No results found for any visits on 03/12/23.      Assessment & Plan:   Problem List Items Addressed This Visit       Other   Anxiety disorder    So far doing well with transition to fluoxetine. Will go up to 20mg  daily.       Other Visit Diagnoses     Injury of right toe, initial encounter    -  Primary   Relevant Orders   DG Toe 2nd Right      Get x-rays today to evaluate for fracture.  We did discuss using a little bit of gauze in between the toes before she buddy tape some.  Continue to ice elevate and rest.  Will call with results once available.  No orders of the defined  types were placed in this encounter.   No follow-ups on file.  [name], MD

## 2023-03-12 NOTE — Assessment & Plan Note (Signed)
So far doing well with transition to fluoxetine. Will go up to 20mg  daily.

## 2023-03-14 ENCOUNTER — Encounter: Payer: Self-pay | Admitting: Family Medicine

## 2023-03-22 ENCOUNTER — Ambulatory Visit
Admission: RE | Admit: 2023-03-22 | Discharge: 2023-03-22 | Disposition: A | Discharge status: Home / Self Care | Payer: PPO | Source: Ambulatory Visit | Attending: Family Medicine | Admitting: Family Medicine

## 2023-03-22 DIAGNOSIS — Z1231 Encounter for screening mammogram for malignant neoplasm of breast: Secondary | ICD-10-CM

## 2023-03-24 ENCOUNTER — Other Ambulatory Visit: Payer: Self-pay | Admitting: Family Medicine

## 2023-03-24 DIAGNOSIS — E039 Hypothyroidism, unspecified: Secondary | ICD-10-CM

## 2023-03-26 ENCOUNTER — Encounter (INDEPENDENT_AMBULATORY_CARE_PROVIDER_SITE_OTHER): Payer: PPO | Admitting: Sports Medicine

## 2023-03-26 DIAGNOSIS — M9905 Segmental and somatic dysfunction of pelvic region: Secondary | ICD-10-CM | POA: Diagnosis not present

## 2023-03-26 DIAGNOSIS — M818 Other osteoporosis without current pathological fracture: Secondary | ICD-10-CM | POA: Diagnosis not present

## 2023-03-26 DIAGNOSIS — M9902 Segmental and somatic dysfunction of thoracic region: Secondary | ICD-10-CM | POA: Diagnosis not present

## 2023-03-26 DIAGNOSIS — M9903 Segmental and somatic dysfunction of lumbar region: Secondary | ICD-10-CM | POA: Diagnosis not present

## 2023-03-26 DIAGNOSIS — M9901 Segmental and somatic dysfunction of cervical region: Secondary | ICD-10-CM | POA: Diagnosis not present

## 2023-03-26 NOTE — Progress Notes (Signed)
Please call patient. Normal mammogram.  Repeat in 1 year.  

## 2023-03-26 NOTE — Telephone Encounter (Signed)

## 2023-03-27 ENCOUNTER — Encounter: Payer: Self-pay | Admitting: Family Medicine

## 2023-03-27 NOTE — Progress Notes (Signed)
HI Cellie, [place] no fracture per the radiologist.  If you are feeling some better you can try going without the buddy wrap and see how it is feeling.

## 2023-04-11 ENCOUNTER — Encounter: Payer: Self-pay | Admitting: Family Medicine

## 2023-04-13 NOTE — Telephone Encounter (Signed)
Sheila Harris is this something you all can help with? Getting evenity approved?

## 2023-04-16 ENCOUNTER — Other Ambulatory Visit: Payer: Self-pay

## 2023-04-16 NOTE — Progress Notes (Signed)
   04/16/2023  Patient ID: Sheila Harris, female   DOB: 03/28/1956, 67 y.o.   MRN: [id number]  Clinic routed request from patient's PCP, Dr. Linford Arnold, regarding affordability of Evenity  -Patient has severe post-menopausal osteoporosis, with T-score of -3.8 -Has taken alendronate 70mg  weekly and Forteo in the past -Provider would like to do 1 year of Evenity followed by Prolia -Specialty pharmacy ran a test claim, and this medication is non-formulary on the patient's plan; so they will not cover the medication -[employer] does have PAP for Evenity, but in order to qualify:  a prior authorization must be submitted and denied by the patient's insurance.  If the plan suggests covered alternatives, an appeal must be submitted.  If the appeal is denied and the patient meets income eligibility, the would qualify for PAP.  You can also apply if PA is denied and plan does not come back with suggested alternatives. Micah Flesher ahead and submitted PA through CoverMyMeds to start this process (KEY:  [id number])  Lenna Gilford, PharmD, DPLA

## 2023-04-17 ENCOUNTER — Other Ambulatory Visit: Payer: Self-pay

## 2023-04-17 ENCOUNTER — Other Ambulatory Visit (HOSPITAL_COMMUNITY): Payer: Self-pay

## 2023-04-17 ENCOUNTER — Encounter: Payer: Self-pay | Admitting: Family Medicine

## 2023-04-17 DIAGNOSIS — M9903 Segmental and somatic dysfunction of lumbar region: Secondary | ICD-10-CM | POA: Diagnosis not present

## 2023-04-17 DIAGNOSIS — M9904 Segmental and somatic dysfunction of sacral region: Secondary | ICD-10-CM | POA: Diagnosis not present

## 2023-04-17 DIAGNOSIS — M25522 Pain in left elbow: Secondary | ICD-10-CM | POA: Diagnosis not present

## 2023-04-17 DIAGNOSIS — M9906 Segmental and somatic dysfunction of lower extremity: Secondary | ICD-10-CM | POA: Diagnosis not present

## 2023-04-17 DIAGNOSIS — M9908 Segmental and somatic dysfunction of rib cage: Secondary | ICD-10-CM | POA: Diagnosis not present

## 2023-04-17 DIAGNOSIS — M9902 Segmental and somatic dysfunction of thoracic region: Secondary | ICD-10-CM | POA: Diagnosis not present

## 2023-04-17 DIAGNOSIS — M9905 Segmental and somatic dysfunction of pelvic region: Secondary | ICD-10-CM | POA: Diagnosis not present

## 2023-04-17 DIAGNOSIS — M546 Pain in thoracic spine: Secondary | ICD-10-CM | POA: Diagnosis not present

## 2023-04-17 DIAGNOSIS — M9907 Segmental and somatic dysfunction of upper extremity: Secondary | ICD-10-CM | POA: Diagnosis not present

## 2023-04-17 DIAGNOSIS — M545 Low back pain, unspecified: Secondary | ICD-10-CM | POA: Diagnosis not present

## 2023-04-18 NOTE — Progress Notes (Signed)
   04/18/2023  Patient ID: Sheila Harris, female   DOB: Apr 25, 1956, 67 y.o.   MRN: [id number]  Prior authorization for Sheila Harris has been approved; contacted specialty pharmacy to have them run a test claim, and the patient's copay would be $100/month.  She would not qualify for a copay card or manufacturer assistance since insurance has agreed to cover.  She could contact insurance to see if she can request a tier reduction.  Notifying Dr. Linford Arnold and will contact patient if needed.  Lenna Gilford, PharmD, DPLA

## 2023-04-18 NOTE — Telephone Encounter (Signed)
Go ahead and forward this to [place] since she has been working on this and has a little bit more intimate knowledge with pricing.  It looks like unfortunately she is not going to be able to afford the $100 a month.

## 2023-04-23 NOTE — Progress Notes (Unsigned)
   04/23/2023  Patient ID: Sheila Harris, female   DOB: 01/30/1956, 67 y.o.   MRN: [id number]

## 2023-05-07 ENCOUNTER — Encounter: Payer: Self-pay | Admitting: Sports Medicine

## 2023-05-07 ENCOUNTER — Ambulatory Visit (INDEPENDENT_AMBULATORY_CARE_PROVIDER_SITE_OTHER): Payer: PPO | Admitting: Sports Medicine

## 2023-05-07 ENCOUNTER — Ambulatory Visit: Payer: PPO

## 2023-05-07 DIAGNOSIS — M5416 Radiculopathy, lumbar region: Secondary | ICD-10-CM

## 2023-05-07 DIAGNOSIS — G8929 Other chronic pain: Secondary | ICD-10-CM | POA: Diagnosis not present

## 2023-05-07 DIAGNOSIS — M25532 Pain in left wrist: Secondary | ICD-10-CM

## 2023-05-07 DIAGNOSIS — M79632 Pain in left forearm: Secondary | ICD-10-CM | POA: Diagnosis not present

## 2023-05-07 NOTE — Addendum Note (Signed)
Addended by: Monica Becton on: 05/07/2023 10:55 AM   Modules accepted: Orders

## 2023-05-07 NOTE — Assessment & Plan Note (Signed)
67 year old female, chronic pain right posterior heel and plantar sole. She does have known L5-S1 DDD diagnosed at an outside facility by MRI. She also has a history of plantar fasciitis, she has custom orthotics, she really does not have any discrete areas of tenderness to palpation at the Achilles insertion or plantar fascial origin. She also gets some cramping from the hip down the back of the leg. I think her symptoms are most likely radicular, adding additional home PT. She was excessively sedated with low-dose gabapentin, Lyrica 25 mg is an option but she will let me know if she would like me to call this in. She is not interested in epidurals.

## 2023-05-07 NOTE — Progress Notes (Signed)
    Procedures performed today:    None.  Independent interpretation of notes and tests performed by another provider:   None.  Brief History, Exam, Impression, and Recommendations:    Right lumbar radiculopathy 67 year old female, chronic pain right posterior heel and plantar sole. She does have known L5-S1 DDD diagnosed at an outside facility by MRI. She also has a history of plantar fasciitis, she has custom orthotics, she really does not have any discrete areas of tenderness to palpation at the Achilles insertion or plantar fascial origin. She also gets some cramping from the hip down the back of the leg. I think her symptoms are most likely radicular, adding additional home PT. She was excessively sedated with low-dose gabapentin, Lyrica 25 mg is an option but she will let me know if she would like me to call this in. She is not interested in epidurals.  Chronic pain of left wrist Approximately 3 months ago English was using a dip machine, her arm slipped and she had some pain on the ulnar aspect of her left wrist. She did have some swelling for On exam today she has no tenderness over the extensor carpi ulnaris tendon sheath, she does have tenderness with ulnar deviation of the wrist concerning for a TFCC injury. We discussed the anatomy, she is hesitant to do any immobilization, we will add some home PT. She is also hesitant to consider injection, at this point the symptoms not bad enough to affect her quality of life so we can continue conservatively and expectantly.    ____________________________________________ Ihor Austin. Benjamin Stain, M.D., ABFM., CAQSM., AME. Primary Care and Sports Medicine [facility]  Adjunct Professor of Family Medicine  [employer]  Restaurant manager, fast food

## 2023-05-07 NOTE — Assessment & Plan Note (Signed)
Approximately 3 months ago Sheila Harris was using a dip machine, her arm slipped and she had some pain on the ulnar aspect of her left wrist. She did have some swelling for On exam today she has no tenderness over the extensor carpi ulnaris tendon sheath, she does have tenderness with ulnar deviation of the wrist concerning for a TFCC injury. We discussed the anatomy, she is hesitant to do any immobilization, we will add some home PT. She is also hesitant to consider injection, at this point the symptoms not bad enough to affect her quality of life so we can continue conservatively and expectantly.

## 2023-05-08 DIAGNOSIS — H35723 Serous detachment of retinal pigment epithelium, bilateral: Secondary | ICD-10-CM | POA: Diagnosis not present

## 2023-05-08 DIAGNOSIS — H353132 Nonexudative age-related macular degeneration, bilateral, intermediate dry stage: Secondary | ICD-10-CM | POA: Diagnosis not present

## 2023-05-08 DIAGNOSIS — H35363 Drusen (degenerative) of macula, bilateral: Secondary | ICD-10-CM | POA: Diagnosis not present

## 2023-05-14 DIAGNOSIS — M9905 Segmental and somatic dysfunction of pelvic region: Secondary | ICD-10-CM | POA: Diagnosis not present

## 2023-05-14 DIAGNOSIS — M9908 Segmental and somatic dysfunction of rib cage: Secondary | ICD-10-CM | POA: Diagnosis not present

## 2023-05-14 DIAGNOSIS — M9903 Segmental and somatic dysfunction of lumbar region: Secondary | ICD-10-CM | POA: Diagnosis not present

## 2023-05-14 DIAGNOSIS — M79662 Pain in left lower leg: Secondary | ICD-10-CM | POA: Diagnosis not present

## 2023-05-14 DIAGNOSIS — M545 Low back pain, unspecified: Secondary | ICD-10-CM | POA: Diagnosis not present

## 2023-05-14 DIAGNOSIS — M546 Pain in thoracic spine: Secondary | ICD-10-CM | POA: Diagnosis not present

## 2023-05-14 DIAGNOSIS — M9904 Segmental and somatic dysfunction of sacral region: Secondary | ICD-10-CM | POA: Diagnosis not present

## 2023-05-14 DIAGNOSIS — M542 Cervicalgia: Secondary | ICD-10-CM | POA: Diagnosis not present

## 2023-05-14 DIAGNOSIS — M9906 Segmental and somatic dysfunction of lower extremity: Secondary | ICD-10-CM | POA: Diagnosis not present

## 2023-05-14 DIAGNOSIS — M9902 Segmental and somatic dysfunction of thoracic region: Secondary | ICD-10-CM | POA: Diagnosis not present

## 2023-05-14 DIAGNOSIS — M9901 Segmental and somatic dysfunction of cervical region: Secondary | ICD-10-CM | POA: Diagnosis not present

## 2023-06-14 ENCOUNTER — Other Ambulatory Visit: Payer: Self-pay | Admitting: Family Medicine

## 2023-06-15 DIAGNOSIS — M9905 Segmental and somatic dysfunction of pelvic region: Secondary | ICD-10-CM | POA: Diagnosis not present

## 2023-06-15 DIAGNOSIS — M9902 Segmental and somatic dysfunction of thoracic region: Secondary | ICD-10-CM | POA: Diagnosis not present

## 2023-06-15 DIAGNOSIS — M9906 Segmental and somatic dysfunction of lower extremity: Secondary | ICD-10-CM | POA: Diagnosis not present

## 2023-06-15 DIAGNOSIS — M79671 Pain in right foot: Secondary | ICD-10-CM | POA: Diagnosis not present

## 2023-06-15 DIAGNOSIS — M9901 Segmental and somatic dysfunction of cervical region: Secondary | ICD-10-CM | POA: Diagnosis not present

## 2023-06-15 DIAGNOSIS — M25551 Pain in right hip: Secondary | ICD-10-CM | POA: Diagnosis not present

## 2023-06-15 DIAGNOSIS — M9904 Segmental and somatic dysfunction of sacral region: Secondary | ICD-10-CM | POA: Diagnosis not present

## 2023-06-15 DIAGNOSIS — M9903 Segmental and somatic dysfunction of lumbar region: Secondary | ICD-10-CM | POA: Diagnosis not present

## 2023-06-15 DIAGNOSIS — M9908 Segmental and somatic dysfunction of rib cage: Secondary | ICD-10-CM | POA: Diagnosis not present

## 2023-06-15 DIAGNOSIS — M7661 Achilles tendinitis, right leg: Secondary | ICD-10-CM | POA: Diagnosis not present

## 2023-06-15 DIAGNOSIS — M542 Cervicalgia: Secondary | ICD-10-CM | POA: Diagnosis not present

## 2023-06-15 DIAGNOSIS — M9907 Segmental and somatic dysfunction of upper extremity: Secondary | ICD-10-CM | POA: Diagnosis not present

## 2023-06-17 ENCOUNTER — Encounter (INDEPENDENT_AMBULATORY_CARE_PROVIDER_SITE_OTHER): Payer: PPO | Admitting: Sports Medicine

## 2023-06-17 DIAGNOSIS — M5416 Radiculopathy, lumbar region: Secondary | ICD-10-CM | POA: Diagnosis not present

## 2023-06-18 ENCOUNTER — Ambulatory Visit: Payer: PPO | Admitting: Sports Medicine

## 2023-06-18 NOTE — Telephone Encounter (Signed)

## 2023-06-21 ENCOUNTER — Ambulatory Visit: Payer: PPO | Admitting: Sports Medicine

## 2023-06-22 ENCOUNTER — Other Ambulatory Visit: Payer: Self-pay | Admitting: *Deleted

## 2023-06-23 ENCOUNTER — Other Ambulatory Visit: Payer: Self-pay | Admitting: Family Medicine

## 2023-06-25 NOTE — Therapy (Signed)
 OUTPATIENT PHYSICAL THERAPY EVALUATION   Patient Name: Sheila Harris MRN: [id number] DOB:[birth year],67 y.o., female Today's Date: 06/26/2023   END OF SESSION:  PT End of Session - 06/26/23 1026     Visit Number 1    Number of Visits 17    Date for PT Re-Evaluation 08/25/23    Authorization Type [employer]    Progress Note Due on Visit 10    PT Start Time 1026   patient late   PT Stop Time 1103    PT Time Calculation (min) 37 min    Activity Tolerance Patient tolerated treatment well    Behavior During Therapy WFL for tasks assessed/performed              Past Medical History:  Diagnosis Date   Genital herpes    Macular degeneration    Thyroid  disease    History reviewed. No pertinent surgical history. Patient Active Problem List   Diagnosis Date Noted   Chronic pain of left wrist 05/07/2023   Anxiety disorder 01/11/2023   Contusion of knee, left 11/09/2022   Metatarsalgia of right foot 03/06/2022   Iron deficiency 02/23/2022   Dry age-related macular degeneration 12/08/2021   Chronic cough 08/11/2021   Chronic foot pain, right 07/12/2021   Left shoulder pain 05/05/2021   Hormone replacement therapy (HRT) 07/08/2020   Corn of foot right, 4/5 interspace 06/07/2020   Herpes infection 11/19/2019   Osteoporosis 11/19/2019   Factor V Leiden (HCC) 11/19/2019   Hypothyroidism 11/19/2019   Anxiety 11/19/2019   Trigger finger of right hand 06/07/2016   Carpal tunnel syndrome, right 02/22/2016   Primary osteoarthritis of right hand 12/30/2015   Degenerative disc disease, cervical 12/30/2015   Trigger finger of left hand 10/08/2015   Pes cavus 11/21/2013   Right lumbar radiculopathy 11/21/2013    PCP: Alvan Dorothyann BIRCH, MD  REFERRING PROVIDER: Curtis Debby PARAS, MD   REFERRING DIAG: M54.16 (ICD-10-CM) - Right lumbar radiculopathy; foot pain   Rationale for Evaluation and Treatment: Rehabilitation  THERAPY DIAG:  Radiculopathy, lumbar  region  Pain in right ankle and joints of right foot  Muscle weakness (generalized)  Abnormal posture  ONSET DATE: acute on chronic with exacerbation in September 2024  SUBJECTIVE:                                                                                                                                                                                           SUBJECTIVE STATEMENT: Patient reports her back pain has been an issue on/off for 40 years of insidious onset.  The pain can radiate from her back down the back  of the RLE. The heel is also very painful and has to roll it in the morning to make it feel better. Was told that it might be plantar fascitis from one doctor and another said it was coming from her back. She thinks the heel pain and exacerbation of back pain might be coming from an injury to the 2nd digit from pushing off the wall in the pool about 3.5 months ago. She denies any numbness or tingling. No red flag symptoms. She had an MRI on her back about a year ago and the doctor told her overall the back looked pretty good.  PERTINENT HISTORY:  Osteoporosis   PAIN:  Are you having pain? Yes: NPRS scale: 4 Pain location: Rt hip, Rt heel Pain description: aching Aggravating factors: prolonged standing > 30 minutes, lifting Relieving factors: rolling out, TENS, short duration of sitting  PRECAUTIONS: None  WEIGHT BEARING RESTRICTIONS: No  FALLS:  Has patient fallen in last 6 months? No  LIVING ENVIRONMENT: Lives with: lives with their family Lives in: House/apartment Stairs: Yes: Internal: flight steps; on right going up Has following equipment at home: None  OCCUPATION: works part-time, psychologist, occupational   PLOF: Independent  PATIENT GOALS: just to be able to get out of bed ok and stand a little longer.    OBJECTIVE:  Note: Objective measures were completed at Evaluation unless otherwise noted.  DIAGNOSTIC FINDINGS:  None on file   PATIENT  SURVEYS:  FOTO 57% function to 64% predicted   SCREENING FOR RED FLAGS: Bowel or bladder incontinence: No  Cauda equina syndrome: No   COGNITION: Overall cognitive status: Within functional limits for tasks assessed     SENSATION: Not tested  MUSCLE LENGTH: Hamstrings: mild tightness bilaterally   POSTURE: decreased lumbar lordosis Rt ribcage lower in standing  Lt pelvis retracted in standing  Pes cavus  PALPATION: TTP proximal plantar fascia/plantar aspect of calcaneous Rt  LUMBAR ROM:   AROM eval  Flexion 10% limited   Extension 25% limited   Right lateral flexion 50% limited  Left lateral flexion 50% limited   Right rotation 50% limited  Left rotation 50% limited    (Blank rows = not tested)  LOWER EXTREMITY ROM:     Active  Right eval Left eval  Hip flexion    Hip extension    Hip abduction    Hip adduction    Hip internal rotation    Hip external rotation    Knee flexion    Knee extension    Ankle dorsiflexion 7   Ankle plantarflexion    Ankle inversion    Ankle eversion     (Blank rows = not tested)  LOWER EXTREMITY MMT:    MMT Right eval Left eval  Hip flexion 4+ 4+  Hip extension 4 4  Hip abduction 4- 4-  Hip adduction    Hip internal rotation    Hip external rotation    Knee flexion 5 5  Knee extension 5 5  Ankle dorsiflexion 5 5  Ankle plantarflexion Partial SL calf raise Partial SL calf raise   Ankle inversion 4 4+  Ankle eversion 4 4+   (Blank rows = not tested)  LUMBAR SPECIAL TESTS:  (-) SLR   FUNCTIONAL TESTS:  Squat: WNL SLS: 30 seconds LLE; 10 seconds RLE Trendelenburg bilateral   GAIT: Distance walked: 20 ft  Assistive device utilized: None Level of assistance: Complete Independence Comments: limited push-off RLE  [facility] Adult PT Treatment:  DATE: 06/26/23  Therapeutic Exercise: Demonstrated and issued initial HEP.       PATIENT EDUCATION:  Education details:  see treatment; POC; assessment findings  Person educated: Patient Education method: Explanation, Demonstration, Tactile cues, Verbal cues, and Handouts Education comprehension: verbalized understanding, returned demonstration, verbal cues required, tactile cues required, and needs further education  HOME EXERCISE PROGRAM: Access Code: [email or web address] URL: [email or web address] Date: 06/26/2023 Prepared by: Lucie Meeter  Exercises - Single Leg Stance  - 1 x daily - 7 x weekly - 3 sets - max  hold  ASSESSMENT:  CLINICAL IMPRESSION: Patient is a 68 y.o. female who was seen today for physical therapy evaluation and treatment for chronic low back/posterior RLE pain and isolated Rt heel pain. She reports her back and RLE pain is a chronic issue, but has recently worsened over the past 3.5 months after injuring her Rt 2nd digit when pushing off the wall when swimming. Upon assessment she is noted to have limited trunk AROM, postural abnormalities, and hip weakness. Specific to the heel she does have palpable tenderness about proximal plantar fascia/plantar aspect of calcaneous, limited DF AROM, bilateral ankle weakness, balance deficits, and gait abnormalities. Did not have time to issue thorough HEP today due to patient being late for evaluation. She will benefit from skilled PT to address the above stated deficits in order to optimize her function and assist in overall pain reduction.    OBJECTIVE IMPAIRMENTS: Abnormal gait, decreased activity tolerance, decreased balance, decreased endurance, decreased knowledge of condition, difficulty walking, decreased ROM, decreased strength, impaired flexibility, improper body mechanics, postural dysfunction, and pain.   ACTIVITY LIMITATIONS: carrying, lifting, standing, squatting, and locomotion level  PARTICIPATION LIMITATIONS: cleaning, laundry, shopping, and community activity  PERSONAL FACTORS: Age, Time since onset of  injury/illness/exacerbation, and 1 comorbidity: osteoporosis  are also affecting patient's functional outcome.   REHAB POTENTIAL: Good  CLINICAL DECISION MAKING: Evolving/moderate complexity  EVALUATION COMPLEXITY: Moderate   GOALS: Goals reviewed with patient? Yes  SHORT TERM GOALS: Target date: 07/24/2023    Patient will be independent and compliant with initial HEP.   Baseline: see above Goal status: INITIAL  2.  Patient will demonstrate at least 10 degrees of Rt ankle DF AROM to improve gait mechanics.  Baseline: see above  Goal status: INITIAL  3.  Patient will maintain Rt SLS for at least 20 seconds with no signs of pelvic drop to improve overall stability.  Baseline: see above  Goal status: INITIAL  4.  Patient will complete full range calf raise to improve push-off during gait cycle.  Baseline: see above  Goal status: INITIAL  LONG TERM GOALS: Target date: 08/25/23  Patient will score at least 64% on FOTO to signify clinically meaningful improvement in functional abilities.   Baseline: see above Goal status: INITIAL  2.  Patient will be able to lift at least 15 lbs with proper form without pain.  Baseline: increased pain with lifting.  Goal status: INITIAL  3.  Patient will self-report tolerating at least 1 hour of standing activity.  Baseline: 30 minutes  Goal status: INITIAL  4.  Patient will demonstrate at least 4+/5 bilateral hip abductor strength to improve stability about the chain with prolonged walking/standing activity.  Baseline: see above Goal status: INITIAL  5.  Patient will be independent with advanced home program to progress/maintain current level of function.  Baseline: see above  Goal status: INITIAL  6.  Patient will demonstrate 5/5 ankle strength to improve gait stability.  Baseline: see  above  Goal status: INITIAL  PLAN:  PT FREQUENCY: 1-2x/week  PT DURATION: 8 weeks  PLANNED INTERVENTIONS: 97164- PT Re-evaluation,  97110-Therapeutic exercises, 97530- Therapeutic activity, [id number]- Neuromuscular re-education, 97535- Self Care, [id number]- Manual therapy, [id number]- Gait training, [id number]- Aquatic Therapy, 97014- Electrical stimulation (unattended), [id number]- Electrical stimulation (manual), [id number]- Vasopneumatic device, [id number]- Ionotophoresis 4mg /ml Dexamethasone, Taping, Dry Needling, Cryotherapy, and Moist heat.  PLAN FOR NEXT SESSION: progress HEP to include calf stretch, ankle strengthening, hip strengthening; progress lifting; core strengthening. Address postural abnormalities including manual work/stretching to improve alignment.    Rosalva Neary, PT, DPT, ATC 06/26/23 12:14 PM

## 2023-06-26 ENCOUNTER — Other Ambulatory Visit: Payer: Self-pay

## 2023-06-26 ENCOUNTER — Ambulatory Visit: Payer: PPO | Attending: Sports Medicine

## 2023-06-26 DIAGNOSIS — M5416 Radiculopathy, lumbar region: Secondary | ICD-10-CM | POA: Insufficient documentation

## 2023-06-26 DIAGNOSIS — M6281 Muscle weakness (generalized): Secondary | ICD-10-CM | POA: Insufficient documentation

## 2023-06-26 DIAGNOSIS — R293 Abnormal posture: Secondary | ICD-10-CM | POA: Diagnosis not present

## 2023-06-26 DIAGNOSIS — M25571 Pain in right ankle and joints of right foot: Secondary | ICD-10-CM | POA: Diagnosis not present

## 2023-06-27 DIAGNOSIS — M19071 Primary osteoarthritis, right ankle and foot: Secondary | ICD-10-CM | POA: Diagnosis not present

## 2023-06-27 DIAGNOSIS — M7741 Metatarsalgia, right foot: Secondary | ICD-10-CM | POA: Diagnosis not present

## 2023-06-27 DIAGNOSIS — M216X1 Other acquired deformities of right foot: Secondary | ICD-10-CM | POA: Diagnosis not present

## 2023-06-27 DIAGNOSIS — M5417 Radiculopathy, lumbosacral region: Secondary | ICD-10-CM | POA: Diagnosis not present

## 2023-06-28 NOTE — Therapy (Signed)
 OUTPATIENT PHYSICAL THERAPY TREATMENT   Patient Name: Sheila Harris MRN: [id number] DOB:[birth year],68 y.o., female Today's Date: 06/29/2023   END OF SESSION:  PT End of Session - 06/29/23 0931     Visit Number 2    Number of Visits 17    Date for PT Re-Evaluation 08/25/23    Authorization Type [employer]    Progress Note Due on Visit 10    PT Start Time 0933    PT Stop Time 1019    PT Time Calculation (min) 46 min    Activity Tolerance Patient tolerated treatment well               Past Medical History:  Diagnosis Date   Genital herpes    Macular degeneration    Thyroid  disease    History reviewed. No pertinent surgical history. Patient Active Problem List   Diagnosis Date Noted   Chronic pain of left wrist 05/07/2023   Anxiety disorder 01/11/2023   Contusion of knee, left 11/09/2022   Metatarsalgia of right foot 03/06/2022   Iron deficiency 02/23/2022   Dry age-related macular degeneration 12/08/2021   Chronic cough 08/11/2021   Chronic foot pain, right 07/12/2021   Left shoulder pain 05/05/2021   Hormone replacement therapy (HRT) 07/08/2020   Corn of foot right, 4/5 interspace 06/07/2020   Herpes infection 11/19/2019   Osteoporosis 11/19/2019   Factor V Leiden (HCC) 11/19/2019   Hypothyroidism 11/19/2019   Anxiety 11/19/2019   Trigger finger of right hand 06/07/2016   Carpal tunnel syndrome, right 02/22/2016   Primary osteoarthritis of right hand 12/30/2015   Degenerative disc disease, cervical 12/30/2015   Trigger finger of left hand 10/08/2015   Pes cavus 11/21/2013   Right lumbar radiculopathy 11/21/2013    PCP: Alvan Dorothyann BIRCH, MD  REFERRING PROVIDER: Curtis Debby PARAS, MD   REFERRING DIAG: M54.16 (ICD-10-CM) - Right lumbar radiculopathy; foot pain   Rationale for Evaluation and Treatment: Rehabilitation  THERAPY DIAG:  Radiculopathy, lumbar region  Pain in right ankle and joints of right foot  Muscle weakness  (generalized)  Abnormal posture  ONSET DATE: acute on chronic with exacerbation in September 2024  SUBJECTIVE:                                                                                                                                                                                          Per eval - Patient reports her back pain has been an issue on/off for 40 years of insidious onset.  The pain can radiate from her back down the back of the RLE. The heel is also very painful and has to  roll it in the morning to make it feel better. Was told that it might be plantar fascitis from one doctor and another said it was coming from her back. She thinks the heel pain and exacerbation of back pain might be coming from an injury to the 2nd digit from pushing off the wall in the pool about 3.5 months ago. She denies any numbness or tingling. No red flag symptoms. She had an MRI on her back about a year ago and the doctor told her overall the back looked pretty good.  SUBJECTIVE STATEMENT: 06/29/2023 Pt states she is having a bit more pain today which she attributes to cold. Has tried rolling her hip/back with mild relief. No other new updates  PERTINENT HISTORY:  Osteoporosis   PAIN:  Are you having pain? Yes: NPRS scale: unrated 06/29/23 Pain location: Rt hip, Rt heel Pain description: aching Aggravating factors: prolonged standing > 30 minutes, lifting Relieving factors: rolling out, TENS, short duration of sitting  PRECAUTIONS: None  WEIGHT BEARING RESTRICTIONS: No  FALLS:  Has patient fallen in last 6 months? No  LIVING ENVIRONMENT: Lives with: lives with their family Lives in: House/apartment Stairs: Yes: Internal: flight steps; on right going up Has following equipment at home: None  OCCUPATION: works part-time, psychologist, occupational   PLOF: Independent  PATIENT GOALS: just to be able to get out of bed ok and stand a little longer.    OBJECTIVE:  Note: Objective measures  were completed at Evaluation unless otherwise noted.  DIAGNOSTIC FINDINGS:  None on file   PATIENT SURVEYS:  FOTO 57% function to 64% predicted   SCREENING FOR RED FLAGS: Bowel or bladder incontinence: No  Cauda equina syndrome: No   COGNITION: Overall cognitive status: Within functional limits for tasks assessed     SENSATION: Not tested  MUSCLE LENGTH: Hamstrings: mild tightness bilaterally   POSTURE: decreased lumbar lordosis Rt ribcage lower in standing  Lt pelvis retracted in standing  Pes cavus  PALPATION: TTP proximal plantar fascia/plantar aspect of calcaneous Rt  LUMBAR ROM:   AROM eval  Flexion 10% limited   Extension 25% limited   Right lateral flexion 50% limited  Left lateral flexion 50% limited   Right rotation 50% limited  Left rotation 50% limited    (Blank rows = not tested)  LOWER EXTREMITY ROM:     Active  Right eval Left eval  Hip flexion    Hip extension    Hip abduction    Hip adduction    Hip internal rotation    Hip external rotation    Knee flexion    Knee extension    Ankle dorsiflexion 7   Ankle plantarflexion    Ankle inversion    Ankle eversion     (Blank rows = not tested)  LOWER EXTREMITY MMT:    MMT Right eval Left eval  Hip flexion 4+ 4+  Hip extension 4 4  Hip abduction 4- 4-  Hip adduction    Hip internal rotation    Hip external rotation    Knee flexion 5 5  Knee extension 5 5  Ankle dorsiflexion 5 5  Ankle plantarflexion Partial SL calf raise Partial SL calf raise   Ankle inversion 4 4+  Ankle eversion 4 4+   (Blank rows = not tested)  LUMBAR SPECIAL TESTS:  (-) SLR   FUNCTIONAL TESTS:  Squat: WNL SLS: 30 seconds LLE; 10 seconds RLE Trendelenburg bilateral   GAIT: Distance walked: 20 ft  Assistive  device utilized: None Level of assistance: Complete Independence Comments: limited push-off RLE   TREATMENT: [facility] Adult PT Treatment:                                                DATE:  06/29/23 Therapeutic Exercise: SLS BIL LE 3x30sec cues for hallux contact, 1 finger support Isolated hallux ext x15 RLE seated Hallux flexion red band x8; seated  Staggered stance paloff press (fwd limb closest to anchor) 2x12 BIL cues for posture, foot positioning, and appropriate alignment Deficit heel raises BIL UE support x15 cues for pacing/tempo HEP handout + education, significant time spent w/ education on relevant anatomy/physiology and rationale for interventions  [facility] Adult PT Treatment:                                                DATE: 06/26/23  Therapeutic Exercise: Demonstrated and issued initial HEP.       PATIENT EDUCATION:  Education details: rationale for interventions, HEP  Person educated: Patient Education method: Explanation, Demonstration, Tactile cues, Verbal cues Education comprehension: verbalized understanding, returned demonstration, verbal cues required, tactile cues required, and needs further education     HOME EXERCISE PROGRAM: Access Code: [email or web address] URL: [email or web address] Date: 06/26/2023 Prepared by: Lucie Meeter  Exercises - Single Leg Stance  - 1 x daily - 7 x weekly - 3 sets - max  hold  ASSESSMENT:  CLINICAL IMPRESSION: 06/29/2023 Pt arrives w/ increase in pain which she attributes to the colder weather. Today we spend significant time w/ education/discussion re: relevant anatomy/physiology and rationale for interventions, benefits of gradual strength training. Pt does well with activity in session, demonstrates tendency towards increased foot supination in closed chain, subsequently working on hallux flexion and more pronated posture which pt does well with. Also reports some muscular work/fatigue with antirotation exercise, staggered stance with emphasis on foot posture. Pt reports reduction in pain on departure unrated on NPS, no adverse events, tolerates activity well overall. Recommend continuing along current POC in  order to address relevant deficits and improve functional tolerance. Pt departs today's session in no acute distress, all voiced questions/concerns addressed appropriately from PT perspective.    Per eval - Patient is a 69 y.o. female who was seen today for physical therapy evaluation and treatment for chronic low back/posterior RLE pain and isolated Rt heel pain. She reports her back and RLE pain is a chronic issue, but has recently worsened over the past 3.5 months after injuring her Rt 2nd digit when pushing off the wall when swimming. Upon assessment she is noted to have limited trunk AROM, postural abnormalities, and hip weakness. Specific to the heel she does have palpable tenderness about proximal plantar fascia/plantar aspect of calcaneous, limited DF AROM, bilateral ankle weakness, balance deficits, and gait abnormalities. Did not have time to issue thorough HEP today due to patient being late for evaluation. She will benefit from skilled PT to address the above stated deficits in order to optimize her function and assist in overall pain reduction.    OBJECTIVE IMPAIRMENTS: Abnormal gait, decreased activity tolerance, decreased balance, decreased endurance, decreased knowledge of condition, difficulty walking, decreased ROM, decreased strength, impaired flexibility, improper body mechanics, postural dysfunction, and pain.  ACTIVITY LIMITATIONS: carrying, lifting, standing, squatting, and locomotion level  PARTICIPATION LIMITATIONS: cleaning, laundry, shopping, and community activity  PERSONAL FACTORS: Age, Time since onset of injury/illness/exacerbation, and 1 comorbidity: osteoporosis  are also affecting patient's functional outcome.   REHAB POTENTIAL: Good  CLINICAL DECISION MAKING: Evolving/moderate complexity  EVALUATION COMPLEXITY: Moderate   GOALS: Goals reviewed with patient? Yes  SHORT TERM GOALS: Target date: 07/24/2023  Patient will be independent and compliant with initial  HEP.   Baseline: see above Goal status: INITIAL  2.  Patient will demonstrate at least 10 degrees of Rt ankle DF AROM to improve gait mechanics.  Baseline: see above  Goal status: INITIAL  3.  Patient will maintain Rt SLS for at least 20 seconds with no signs of pelvic drop to improve overall stability.  Baseline: see above  Goal status: INITIAL  4.  Patient will complete full range calf raise to improve push-off during gait cycle.  Baseline: see above  Goal status: INITIAL  LONG TERM GOALS: Target date: 08/25/23  Patient will score at least 64% on FOTO to signify clinically meaningful improvement in functional abilities.   Baseline: see above Goal status: INITIAL  2.  Patient will be able to lift at least 15 lbs with proper form without pain.  Baseline: increased pain with lifting.  Goal status: INITIAL  3.  Patient will self-report tolerating at least 1 hour of standing activity.  Baseline: 30 minutes  Goal status: INITIAL  4.  Patient will demonstrate at least 4+/5 bilateral hip abductor strength to improve stability about the chain with prolonged walking/standing activity.  Baseline: see above Goal status: INITIAL  5.  Patient will be independent with advanced home program to progress/maintain current level of function.  Baseline: see above  Goal status: INITIAL  6.  Patient will demonstrate 5/5 ankle strength to improve gait stability.  Baseline: see above  Goal status: INITIAL  PLAN:  PT FREQUENCY: 1-2x/week  PT DURATION: 8 weeks  PLANNED INTERVENTIONS: 97164- PT Re-evaluation, 97110-Therapeutic exercises, 97530- Therapeutic activity, [id number]- Neuromuscular re-education, 97535- Self Care, [id number]- Manual therapy, [id number]- Gait training, [id number]- Aquatic Therapy, [id number]- Electrical stimulation (unattended), [id number]- Electrical stimulation (manual), [id number]- Vasopneumatic device, [id number]- Ionotophoresis 4mg /ml Dexamethasone, Taping, Dry Needling, Cryotherapy, and Moist  heat.  PLAN FOR NEXT SESSION: progress HEP to include calf stretch, ankle strengthening, hip strengthening; progress lifting; core strengthening. Address postural abnormalities including manual work/stretching to improve alignment.    Alm DELENA Jenny PT, DPT 06/29/2023 10:27 AM

## 2023-06-29 ENCOUNTER — Ambulatory Visit: Payer: PPO | Admitting: Physical Therapy

## 2023-06-29 ENCOUNTER — Encounter: Payer: Self-pay | Admitting: Physical Therapy

## 2023-06-29 DIAGNOSIS — R293 Abnormal posture: Secondary | ICD-10-CM

## 2023-06-29 DIAGNOSIS — M25571 Pain in right ankle and joints of right foot: Secondary | ICD-10-CM

## 2023-06-29 DIAGNOSIS — M6281 Muscle weakness (generalized): Secondary | ICD-10-CM

## 2023-06-29 DIAGNOSIS — M5416 Radiculopathy, lumbar region: Secondary | ICD-10-CM

## 2023-07-03 ENCOUNTER — Ambulatory Visit: Payer: PPO

## 2023-07-03 DIAGNOSIS — M25571 Pain in right ankle and joints of right foot: Secondary | ICD-10-CM

## 2023-07-03 DIAGNOSIS — M5416 Radiculopathy, lumbar region: Secondary | ICD-10-CM | POA: Diagnosis not present

## 2023-07-03 DIAGNOSIS — R293 Abnormal posture: Secondary | ICD-10-CM

## 2023-07-03 DIAGNOSIS — M6281 Muscle weakness (generalized): Secondary | ICD-10-CM

## 2023-07-03 NOTE — Patient Instructions (Signed)

## 2023-07-03 NOTE — Therapy (Signed)
 OUTPATIENT PHYSICAL THERAPY TREATMENT   Patient Name: Sheila Harris MRN: [id number] DOB:May 05, 1956,68 y.o., female Today's Date: 07/03/2023   END OF SESSION:  PT End of Session - 07/03/23 1533     Visit Number 3    Number of Visits 17    Date for PT Re-Evaluation 08/25/23    Authorization Type [employer]    Progress Note Due on Visit 10    PT Start Time 1533    PT Stop Time 1614    PT Time Calculation (min) 41 min    Activity Tolerance Patient tolerated treatment well    Behavior During Therapy WFL for tasks assessed/performed                Past Medical History:  Diagnosis Date   Genital herpes    Macular degeneration    Thyroid  disease    History reviewed. No pertinent surgical history. Patient Active Problem List   Diagnosis Date Noted   Chronic pain of left wrist 05/07/2023   Anxiety disorder 01/11/2023   Contusion of knee, left 11/09/2022   Metatarsalgia of right foot 03/06/2022   Iron deficiency 02/23/2022   Dry age-related macular degeneration 12/08/2021   Chronic cough 08/11/2021   Chronic foot pain, right 07/12/2021   Left shoulder pain 05/05/2021   Hormone replacement therapy (HRT) 07/08/2020   Corn of foot right, 4/5 interspace 06/07/2020   Herpes infection 11/19/2019   Osteoporosis 11/19/2019   Factor V Leiden (HCC) 11/19/2019   Hypothyroidism 11/19/2019   Anxiety 11/19/2019   Trigger finger of right hand 06/07/2016   Carpal tunnel syndrome, right 02/22/2016   Primary osteoarthritis of right hand 12/30/2015   Degenerative disc disease, cervical 12/30/2015   Trigger finger of left hand 10/08/2015   Pes cavus 11/21/2013   Right lumbar radiculopathy 11/21/2013    PCP: Alvan Dorothyann BIRCH, MD  REFERRING PROVIDER: Curtis Debby PARAS, MD   REFERRING DIAG: M54.16 (ICD-10-CM) - Right lumbar radiculopathy; foot pain   Rationale for Evaluation and Treatment: Rehabilitation  THERAPY DIAG:  Radiculopathy, lumbar region  Pain  in right ankle and joints of right foot  Muscle weakness (generalized)  Abnormal posture  ONSET DATE: acute on chronic with exacerbation in September 2024  SUBJECTIVE:                                                                                                                                                                                          Not so great. She saw the podiatrist and was told she has plantar keratosis and is getting new orthotics. It hurts with any pressure on the outside of the foot.  Per eval - Patient reports her back pain has been an issue on/off for 40 years of insidious onset.  The pain can radiate from her back down the back of the RLE. The heel is also very painful and has to roll it in the morning to make it feel better. Was told that it might be plantar fascitis from one doctor and another said it was coming from her back. She thinks the heel pain and exacerbation of back pain might be coming from an injury to the 2nd digit from pushing off the wall in the pool about 3.5 months ago. She denies any numbness or tingling. No red flag symptoms. She had an MRI on her back about a year ago and the doctor told her overall the back looked pretty good.   PERTINENT HISTORY:  Osteoporosis   PAIN:  Are you having pain? Yes: NPRS scale: 7/10 Pain location: Rt heel/achilles tendon insertion Pain description: aching Aggravating factors: prolonged standing > 30 minutes, lifting Relieving factors: rolling out, TENS, short duration of sitting  PRECAUTIONS: None  WEIGHT BEARING RESTRICTIONS: No  FALLS:  Has patient fallen in last 6 months? No  LIVING ENVIRONMENT: Lives with: lives with their family Lives in: House/apartment Stairs: Yes: Internal: flight steps; on right going up Has following equipment at home: None  OCCUPATION: works part-time, psychologist, occupational   PLOF: Independent  PATIENT GOALS: just to be able to get out of bed ok and stand a little  longer.    OBJECTIVE:  Note: Objective measures were completed at Evaluation unless otherwise noted.  DIAGNOSTIC FINDINGS:  None on file   PATIENT SURVEYS:  FOTO 57% function to 64% predicted   SCREENING FOR RED FLAGS: Bowel or bladder incontinence: No  Cauda equina syndrome: No   COGNITION: Overall cognitive status: Within functional limits for tasks assessed     SENSATION: Not tested  MUSCLE LENGTH: Hamstrings: mild tightness bilaterally   POSTURE: decreased lumbar lordosis Rt ribcage lower in standing  Lt pelvis retracted in standing  Pes cavus  PALPATION: TTP proximal plantar fascia/plantar aspect of calcaneous Rt  LUMBAR ROM:   AROM eval  Flexion 10% limited   Extension 25% limited   Right lateral flexion 50% limited  Left lateral flexion 50% limited   Right rotation 50% limited  Left rotation 50% limited    (Blank rows = not tested)  LOWER EXTREMITY ROM:     Active  Right eval Left eval  Hip flexion    Hip extension    Hip abduction    Hip adduction    Hip internal rotation    Hip external rotation    Knee flexion    Knee extension    Ankle dorsiflexion 7   Ankle plantarflexion    Ankle inversion    Ankle eversion     (Blank rows = not tested)  LOWER EXTREMITY MMT:    MMT Right eval Left eval  Hip flexion 4+ 4+  Hip extension 4 4  Hip abduction 4- 4-  Hip adduction    Hip internal rotation    Hip external rotation    Knee flexion 5 5  Knee extension 5 5  Ankle dorsiflexion 5 5  Ankle plantarflexion Partial SL calf raise Partial SL calf raise   Ankle inversion 4 4+  Ankle eversion 4 4+   (Blank rows = not tested)  LUMBAR SPECIAL TESTS:  (-) SLR   FUNCTIONAL TESTS:  Squat: WNL SLS: 30 seconds LLE; 10 seconds RLE  Trendelenburg bilateral   GAIT: Distance walked: 20 ft  Assistive device utilized: None Level of assistance: Complete Independence Comments: limited push-off RLE  [facility] Adult PT Treatment:                                                 DATE: 07/03/23 Therapeutic Exercise: Resisted plantarflexion black band 2 x 10  Resisted eversion red band 2 x 10  Resisted inversion red band 2 x 10  Updated HEP  Manual Therapy: STM Rt gastroc/soleus Skilled palpation of trigger points  Trigger Point Dry Needling  Initial Treatment: Pt instructed on Dry Needling rational, procedures, and possible side effects. Pt instructed to expect mild to moderate muscle soreness later in the day and/or into the next day.  Pt instructed in methods to reduce muscle soreness. Pt instructed to continue prescribed HEP. Patient verbalized understanding of these instructions and education.   Patient Verbal Consent Given: Yes Education Handout Provided: Yes Muscles Treated: Rt gastroc/soleus  Electrical Stimulation Performed: No Treatment Response/Outcome: twitch response elicited        [facility] Adult PT Treatment:                                                DATE: 06/29/23 Therapeutic Exercise: SLS BIL LE 3x30sec cues for hallux contact, 1 finger support Isolated hallux ext x15 RLE seated Hallux flexion red band x8; seated  Staggered stance paloff press (fwd limb closest to anchor) 2x12 BIL cues for posture, foot positioning, and appropriate alignment Deficit heel raises BIL UE support x15 cues for pacing/tempo HEP handout + education, significant time spent w/ education on relevant anatomy/physiology and rationale for interventions  [facility] Adult PT Treatment:                                                DATE: 06/26/23  Therapeutic Exercise: Demonstrated and issued initial HEP.       PATIENT EDUCATION:  Education details: HEP update  Person educated: Patient Education method: Explanation, Demonstration, Tactile cues, Verbal cues, handout  Education comprehension: verbalized understanding, returned demonstration, verbal cues required, tactile cues required, and needs further education     HOME EXERCISE  PROGRAM: Access Code: [email or web address] URL: [email or web address] Date: 07/03/2023 Prepared by: Lucie Meeter  Program Notes - with great toe flexion, please perform seated as done in clinic- with antirotation press, please do in staggered stance, with forward foot closest to wall, as done in clinic  Exercises - Single Leg Stance  - 1 x daily - 7 x weekly - 3 sets - max  hold - Great Toe Flexion with Resistance  - 1 x daily - 7 x weekly - 2-3 sets - 8 reps - Standing Anti-Rotation Press with Anchored Resistance  - 1 x daily - 7 x weekly - 2-3 sets - 8-10 reps - Seated Ankle Plantar Flexion with Resistance Loop  - 1 x daily - 7 x weekly - 2 sets - 10 reps - Long Sitting Ankle Eversion with Resistance  - 1 x daily - 7 x weekly - 2 sets - 10 reps -  Long Sitting Ankle Inversion with Resistance  - 1 x daily - 7 x weekly - 2 sets - 10 reps  ASSESSMENT:  CLINICAL IMPRESSION: TPDN performed to Rt gastroc and soleus with excellent twitch response elicited. Focused on ankle strengthening today with good tolerance, though patient has to be careful of placing direct pressure about Rt lateral foot due to plantar keratosis as direct pressure causes increased pain to this lesion.   Per eval - Patient is a 68 y.o. female who was seen today for physical therapy evaluation and treatment for chronic low back/posterior RLE pain and isolated Rt heel pain. She reports her back and RLE pain is a chronic issue, but has recently worsened over the past 3.5 months after injuring her Rt 2nd digit when pushing off the wall when swimming. Upon assessment she is noted to have limited trunk AROM, postural abnormalities, and hip weakness. Specific to the heel she does have palpable tenderness about proximal plantar fascia/plantar aspect of calcaneous, limited DF AROM, bilateral ankle weakness, balance deficits, and gait abnormalities. Did not have time to issue thorough HEP today due to patient being late for evaluation.  She will benefit from skilled PT to address the above stated deficits in order to optimize her function and assist in overall pain reduction.    OBJECTIVE IMPAIRMENTS: Abnormal gait, decreased activity tolerance, decreased balance, decreased endurance, decreased knowledge of condition, difficulty walking, decreased ROM, decreased strength, impaired flexibility, improper body mechanics, postural dysfunction, and pain.   ACTIVITY LIMITATIONS: carrying, lifting, standing, squatting, and locomotion level  PARTICIPATION LIMITATIONS: cleaning, laundry, shopping, and community activity  PERSONAL FACTORS: Age, Time since onset of injury/illness/exacerbation, and 1 comorbidity: osteoporosis  are also affecting patient's functional outcome.   REHAB POTENTIAL: Good  CLINICAL DECISION MAKING: Evolving/moderate complexity  EVALUATION COMPLEXITY: Moderate   GOALS: Goals reviewed with patient? Yes  SHORT TERM GOALS: Target date: 07/24/2023  Patient will be independent and compliant with initial HEP.   Baseline: see above Goal status: INITIAL  2.  Patient will demonstrate at least 10 degrees of Rt ankle DF AROM to improve gait mechanics.  Baseline: see above  Goal status: INITIAL  3.  Patient will maintain Rt SLS for at least 20 seconds with no signs of pelvic drop to improve overall stability.  Baseline: see above  Goal status: INITIAL  4.  Patient will complete full range calf raise to improve push-off during gait cycle.  Baseline: see above  Goal status: INITIAL  LONG TERM GOALS: Target date: 08/25/23  Patient will score at least 64% on FOTO to signify clinically meaningful improvement in functional abilities.   Baseline: see above Goal status: INITIAL  2.  Patient will be able to lift at least 15 lbs with proper form without pain.  Baseline: increased pain with lifting.  Goal status: INITIAL  3.  Patient will self-report tolerating at least 1 hour of standing activity.  Baseline:  30 minutes  Goal status: INITIAL  4.  Patient will demonstrate at least 4+/5 bilateral hip abductor strength to improve stability about the chain with prolonged walking/standing activity.  Baseline: see above Goal status: INITIAL  5.  Patient will be independent with advanced home program to progress/maintain current level of function.  Baseline: see above  Goal status: INITIAL  6.  Patient will demonstrate 5/5 ankle strength to improve gait stability.  Baseline: see above  Goal status: INITIAL  PLAN:  PT FREQUENCY: 1-2x/week  PT DURATION: 8 weeks  PLANNED INTERVENTIONS: 02835- PT Re-evaluation,  97110-Therapeutic exercises, 97530- Therapeutic activity, [id number]- Neuromuscular re-education, 97535- Self Care, [id number]- Manual therapy, [id number]- Gait training, [id number]- Aquatic Therapy, [id number]- Electrical stimulation (unattended), [id number]- Electrical stimulation (manual), [id number]- Vasopneumatic device, [id number]- Ionotophoresis 4mg /ml Dexamethasone, Taping, Dry Needling, Cryotherapy, and Moist heat.  PLAN FOR NEXT SESSION: progress HEP to include calf stretch, ankle strengthening, hip strengthening; progress lifting; core strengthening. Address postural abnormalities including manual work/stretching to improve alignment.    Taffany Heiser, PT, DPT, ATC 07/03/23 4:18 PM

## 2023-07-04 DIAGNOSIS — A6004 Herpesviral vulvovaginitis: Secondary | ICD-10-CM | POA: Diagnosis not present

## 2023-07-05 DIAGNOSIS — M9902 Segmental and somatic dysfunction of thoracic region: Secondary | ICD-10-CM | POA: Diagnosis not present

## 2023-07-05 DIAGNOSIS — M9905 Segmental and somatic dysfunction of pelvic region: Secondary | ICD-10-CM | POA: Diagnosis not present

## 2023-07-05 DIAGNOSIS — M9903 Segmental and somatic dysfunction of lumbar region: Secondary | ICD-10-CM | POA: Diagnosis not present

## 2023-07-05 DIAGNOSIS — M9901 Segmental and somatic dysfunction of cervical region: Secondary | ICD-10-CM | POA: Diagnosis not present

## 2023-07-05 NOTE — Therapy (Signed)
OUTPATIENT PHYSICAL THERAPY TREATMENT   Patient Name: Sheila Harris MRN: [id number] DOB:[birth year],68 y.o., female Today's Date: 07/06/2023   END OF SESSION:  PT End of Session - 07/06/23 1106     Visit Number 4    Number of Visits 17    Date for PT Re-Evaluation 08/25/23    Authorization Type [employer]    Progress Note Due on Visit 10    PT Start Time 1106   late check in   PT Stop Time 1146    PT Time Calculation (min) 40 min    Activity Tolerance Patient tolerated treatment well                 Past Medical History:  Diagnosis Date   Genital herpes    Macular degeneration    Thyroid disease    History reviewed. No pertinent surgical history. Patient Active Problem List   Diagnosis Date Noted   Chronic pain of left wrist 05/07/2023   Anxiety disorder 01/11/2023   Contusion of knee, left 11/09/2022   Metatarsalgia of right foot 03/06/2022   Iron deficiency 02/23/2022   Dry age-related macular degeneration 12/08/2021   Chronic cough 08/11/2021   Chronic foot pain, right 07/12/2021   Left shoulder pain 05/05/2021   Hormone replacement therapy (HRT) 07/08/2020   Corn of foot right, 4/5 interspace 06/07/2020   Herpes infection 11/19/2019   Osteoporosis 11/19/2019   Factor V Leiden (HCC) 11/19/2019   Hypothyroidism 11/19/2019   Anxiety 11/19/2019   Trigger finger of right hand 06/07/2016   Carpal tunnel syndrome, right 02/22/2016   Primary osteoarthritis of right hand 12/30/2015   Degenerative disc disease, cervical 12/30/2015   Trigger finger of left hand 10/08/2015   Pes cavus 11/21/2013   Right lumbar radiculopathy 11/21/2013    PCP: Agapito Games, MD  REFERRING PROVIDER: Monica Becton, MD   REFERRING DIAG: M54.16 (ICD-10-CM) - Right lumbar radiculopathy; foot pain   Rationale for Evaluation and Treatment: Rehabilitation  THERAPY DIAG:  Radiculopathy, lumbar region  Pain in right ankle and joints of right  foot  Muscle weakness (generalized)  Abnormal posture  ONSET DATE: acute on chronic with exacerbation in September 2024  SUBJECTIVE:                                                                                                                                                                                          07/06/2023 Pt states she was a bit sore after dry needling, improved with heat, maybe helped heel pain a bit the following day. Has continued rolling her hip, back, and calf. Continues to report transient relief  with this but returns to baseline. RLE pain 6-7/10, back/hip 5/10 today.    Per eval - Patient reports her back pain has been an issue on/off for 40 years of insidious onset.  The pain can radiate from her back down the back of the RLE. The heel is also very painful and has to roll it in the morning to make it feel better. Was told that it might be plantar fascitis from one doctor and another said it was coming from her back. She thinks the heel pain and exacerbation of back pain might be coming from an injury to the 2nd digit from pushing off the wall in the pool about 3.5 months ago. She denies any numbness or tingling. No red flag symptoms. She had an MRI on her back about a year ago and the doctor told her "overall the back looked pretty good."   PERTINENT HISTORY:  Osteoporosis   PAIN:  Are you having pain? Yes: NPRS scale: see subjective above Pain location: Rt heel/achilles tendon insertion Pain description: aching Aggravating factors: prolonged standing > 30 minutes, lifting Relieving factors: rolling out, TENS, short duration of sitting  PRECAUTIONS: None  WEIGHT BEARING RESTRICTIONS: No  FALLS:  Has patient fallen in last 6 months? No  LIVING ENVIRONMENT: Lives with: lives with their family Lives in: House/apartment Stairs: Yes: Internal: flight steps; on right going up Has following equipment at home: None  OCCUPATION: works part-time, Copy   PLOF: Independent  PATIENT GOALS: "just to be able to get out of bed ok and stand a little longer."    OBJECTIVE:  Note: Objective measures were completed at Evaluation unless otherwise noted.  DIAGNOSTIC FINDINGS:  None on file   PATIENT SURVEYS:  FOTO 57% function to 64% predicted   SCREENING FOR RED FLAGS: Bowel or bladder incontinence: No  Cauda equina syndrome: No   COGNITION: Overall cognitive status: Within functional limits for tasks assessed     SENSATION: Not tested  MUSCLE LENGTH: Hamstrings: mild tightness bilaterally   POSTURE: decreased lumbar lordosis Rt ribcage lower in standing  Lt pelvis retracted in standing  Pes cavus  PALPATION: TTP proximal plantar fascia/plantar aspect of calcaneous Rt  LUMBAR ROM:   AROM eval  Flexion 10% limited   Extension 25% limited   Right lateral flexion 50% limited  Left lateral flexion 50% limited   Right rotation 50% limited  Left rotation 50% limited    (Blank rows = not tested)  LOWER EXTREMITY ROM:     Active  Right eval Left eval  Hip flexion    Hip extension    Hip abduction    Hip adduction    Hip internal rotation    Hip external rotation    Knee flexion    Knee extension    Ankle dorsiflexion 7   Ankle plantarflexion    Ankle inversion    Ankle eversion     (Blank rows = not tested)  LOWER EXTREMITY MMT:    MMT Right eval Left eval  Hip flexion 4+ 4+  Hip extension 4 4  Hip abduction 4- 4-  Hip adduction    Hip internal rotation    Hip external rotation    Knee flexion 5 5  Knee extension 5 5  Ankle dorsiflexion 5 5  Ankle plantarflexion Partial SL calf raise Partial SL calf raise   Ankle inversion 4 4+  Ankle eversion 4 4+   (Blank rows = not tested)  LUMBAR SPECIAL TESTS:  (-)  SLR   FUNCTIONAL TESTS:  Squat: WNL SLS: 30 seconds LLE; 10 seconds RLE Trendelenburg bilateral   GAIT: Distance walked: 20 ft  Assistive device utilized: None Level of  assistance: Complete Independence Comments: limited push-off RLE    [facility] Adult PT Treatment:                                                DATE: 07/06/23 Therapeutic Exercise: Deficit heel raises BIL UE support 2x12 cues for pacing and mechanics Bosu lunge 2x8 BIL w/ gentle UE support cues for mechanics and appropriate weight shift Green band pull down on airex x10 cues for posture and foot mechanics HEP discussion, significant time spent w/ education/discussion re: rationale for interventions, benefit of strengthening tissues, relevant anatomy/physiology   Manual Therapy: Prone; STM to R gastroc/soleus w/ trigger point release   [facility] Adult PT Treatment:                                                DATE: 07/03/23 Therapeutic Exercise: Resisted plantarflexion black band 2 x 10  Resisted eversion red band 2 x 10  Resisted inversion red band 2 x 10  Updated HEP  Manual Therapy: STM Rt gastroc/soleus Skilled palpation of trigger points  Trigger Point Dry Needling  Initial Treatment: Pt instructed on Dry Needling rational, procedures, and possible side effects. Pt instructed to expect mild to moderate muscle soreness later in the day and/or into the next day.  Pt instructed in methods to reduce muscle soreness. Pt instructed to continue prescribed HEP. Patient verbalized understanding of these instructions and education.   Patient Verbal Consent Given: Yes Education Handout Provided: Yes Muscles Treated: Rt gastroc/soleus  Electrical Stimulation Performed: No Treatment Response/Outcome: twitch response elicited        [facility] Adult PT Treatment:                                                DATE: 06/29/23 Therapeutic Exercise: SLS BIL LE 3x30sec cues for hallux contact, 1 finger support Isolated hallux ext x15 RLE seated Hallux flexion red band x8; seated  Staggered stance paloff press (fwd limb closest to anchor) 2x12 BIL cues for posture, foot positioning, and appropriate  alignment Deficit heel raises BIL UE support x15 cues for pacing/tempo HEP handout + education, significant time spent w/ education on relevant anatomy/physiology and rationale for interventions  [facility] Adult PT Treatment:                                                DATE: 06/26/23  Therapeutic Exercise: Demonstrated and issued initial HEP.       PATIENT EDUCATION:  Education details: rationale for interventions, HEP  Person educated: Patient Education method: Explanation, Demonstration, Tactile cues, Verbal cues Education comprehension: verbalized understanding, returned demonstration, verbal cues required, tactile cues required, and needs further education      HOME EXERCISE PROGRAM: Access Code: [email or web address] URL: [email or web address] Date: 07/03/2023 Prepared by:  Samantha Pexa  Program Notes - with great toe flexion, please perform seated as done in clinic- with antirotation press, please do in staggered stance, with forward foot closest to wall, as done in clinic  Exercises - Single Leg Stance  - 1 x daily - 7 x weekly - 3 sets - max  hold - Great Toe Flexion with Resistance  - 1 x daily - 7 x weekly - 2-3 sets - 8 reps - Standing Anti-Rotation Press with Anchored Resistance  - 1 x daily - 7 x weekly - 2-3 sets - 8-10 reps - Seated Ankle Plantar Flexion with Resistance Loop  - 1 x daily - 7 x weekly - 2 sets - 10 reps - Long Sitting Ankle Eversion with Resistance  - 1 x daily - 7 x weekly - 2 sets - 10 reps - Long Sitting Ankle Inversion with Resistance  - 1 x daily - 7 x weekly - 2 sets - 10 reps  ASSESSMENT:  CLINICAL IMPRESSION: 07/06/2023 Pt arrives w/ pain as above. Continuing to reinforce benefit of exercise/strengthening to build tissue resilience as she continues to endorse transient improvement in pain with self-administered manual and stretching but return to baseline. Able to progress for volume with ankle strengthening, continuing to integrate hip/core  work with compound movements. Manual at end of session per pt request, reports improved symptoms on departure although does have some muscular soreness in calf. No adverse events. Recommend continuing along current POC in order to address relevant deficits and improve functional tolerance. Pt departs today's session in no acute distress, all voiced questions/concerns addressed appropriately from PT perspective.    Per eval - Patient is a 68 y.o. female who was seen today for physical therapy evaluation and treatment for chronic low back/posterior RLE pain and isolated Rt heel pain. She reports her back and RLE pain is a chronic issue, but has recently worsened over the past 3.5 months after injuring her Rt 2nd digit when pushing off the wall when swimming. Upon assessment she is noted to have limited trunk AROM, postural abnormalities, and hip weakness. Specific to the heel she does have palpable tenderness about proximal plantar fascia/plantar aspect of calcaneous, limited DF AROM, bilateral ankle weakness, balance deficits, and gait abnormalities. Did not have time to issue thorough HEP today due to patient being late for evaluation. She will benefit from skilled PT to address the above stated deficits in order to optimize her function and assist in overall pain reduction.    OBJECTIVE IMPAIRMENTS: Abnormal gait, decreased activity tolerance, decreased balance, decreased endurance, decreased knowledge of condition, difficulty walking, decreased ROM, decreased strength, impaired flexibility, improper body mechanics, postural dysfunction, and pain.   ACTIVITY LIMITATIONS: carrying, lifting, standing, squatting, and locomotion level  PARTICIPATION LIMITATIONS: cleaning, laundry, shopping, and community activity  PERSONAL FACTORS: Age, Time since onset of injury/illness/exacerbation, and 1 comorbidity: osteoporosis  are also affecting patient's functional outcome.   REHAB POTENTIAL: Good  CLINICAL  DECISION MAKING: Evolving/moderate complexity  EVALUATION COMPLEXITY: Moderate   GOALS: Goals reviewed with patient? Yes  SHORT TERM GOALS: Target date: 07/24/2023  Patient will be independent and compliant with initial HEP.   Baseline: see above Goal status: INITIAL  2.  Patient will demonstrate at least 10 degrees of Rt ankle DF AROM to improve gait mechanics.  Baseline: see above  Goal status: INITIAL  3.  Patient will maintain Rt SLS for at least 20 seconds with no signs of pelvic drop to improve overall stability.  Baseline: see above  Goal status: INITIAL  4.  Patient will complete full range calf raise to improve push-off during gait cycle.  Baseline: see above  Goal status: INITIAL  LONG TERM GOALS: Target date: 08/25/23  Patient will score at least 64% on FOTO to signify clinically meaningful improvement in functional abilities.   Baseline: see above Goal status: INITIAL  2.  Patient will be able to lift at least 15 lbs with proper form without pain.  Baseline: increased pain with lifting.  Goal status: INITIAL  3.  Patient will self-report tolerating at least 1 hour of standing activity.  Baseline: 30 minutes  Goal status: INITIAL  4.  Patient will demonstrate at least 4+/5 bilateral hip abductor strength to improve stability about the chain with prolonged walking/standing activity.  Baseline: see above Goal status: INITIAL  5.  Patient will be independent with advanced home program to progress/maintain current level of function.  Baseline: see above  Goal status: INITIAL  6.  Patient will demonstrate 5/5 ankle strength to improve gait stability.  Baseline: see above  Goal status: INITIAL  PLAN:  PT FREQUENCY: 1-2x/week  PT DURATION: 8 weeks  PLANNED INTERVENTIONS: 97164- PT Re-evaluation, 97110-Therapeutic exercises, 97530- Therapeutic activity, [id number]- Neuromuscular re-education, 97535- Self Care, [id number]- Manual therapy, [id number]- Gait training, [id number]-  Aquatic Therapy, 97014- Electrical stimulation (unattended), [id number]- Electrical stimulation (manual), [id number]- Vasopneumatic device, [id number]- Ionotophoresis 4mg /ml Dexamethasone, Taping, Dry Needling, Cryotherapy, and Moist heat.  PLAN FOR NEXT SESSION: progress HEP to include calf stretch, ankle strengthening, hip strengthening; progress lifting; core strengthening. Address postural abnormalities including manual work/stretching to improve alignment.    Ashley Murrain PT, DPT 07/06/2023 11:58 AM

## 2023-07-06 ENCOUNTER — Ambulatory Visit: Payer: PPO | Admitting: Physical Therapy

## 2023-07-06 ENCOUNTER — Encounter: Payer: Self-pay | Admitting: Physical Therapy

## 2023-07-06 DIAGNOSIS — M25571 Pain in right ankle and joints of right foot: Secondary | ICD-10-CM

## 2023-07-06 DIAGNOSIS — M5416 Radiculopathy, lumbar region: Secondary | ICD-10-CM

## 2023-07-06 DIAGNOSIS — M6281 Muscle weakness (generalized): Secondary | ICD-10-CM

## 2023-07-06 DIAGNOSIS — R293 Abnormal posture: Secondary | ICD-10-CM

## 2023-07-10 ENCOUNTER — Ambulatory Visit: Payer: PPO

## 2023-07-10 DIAGNOSIS — R293 Abnormal posture: Secondary | ICD-10-CM

## 2023-07-10 DIAGNOSIS — M5416 Radiculopathy, lumbar region: Secondary | ICD-10-CM | POA: Diagnosis not present

## 2023-07-10 DIAGNOSIS — M6281 Muscle weakness (generalized): Secondary | ICD-10-CM

## 2023-07-10 DIAGNOSIS — M25571 Pain in right ankle and joints of right foot: Secondary | ICD-10-CM

## 2023-07-10 NOTE — Therapy (Signed)
OUTPATIENT PHYSICAL THERAPY TREATMENT   Patient Name: Sheila Harris MRN: [id number] DOB:05-02-1956,67 y.o., female Today's Date: 07/10/2023   END OF SESSION:  PT End of Session - 07/10/23 1536     Visit Number 5    Number of Visits 17    Date for PT Re-Evaluation 08/25/23    Authorization Type [employer]    Progress Note Due on Visit 10    PT Start Time 1535    PT Stop Time 1614    PT Time Calculation (min) 39 min    Activity Tolerance Patient tolerated treatment well    Behavior During Therapy WFL for tasks assessed/performed                  Past Medical History:  Diagnosis Date   Genital herpes    Macular degeneration    Thyroid disease    History reviewed. No pertinent surgical history. Patient Active Problem List   Diagnosis Date Noted   Chronic pain of left wrist 05/07/2023   Anxiety disorder 01/11/2023   Contusion of knee, left 11/09/2022   Metatarsalgia of right foot 03/06/2022   Iron deficiency 02/23/2022   Dry age-related macular degeneration 12/08/2021   Chronic cough 08/11/2021   Chronic foot pain, right 07/12/2021   Left shoulder pain 05/05/2021   Hormone replacement therapy (HRT) 07/08/2020   Corn of foot right, 4/5 interspace 06/07/2020   Herpes infection 11/19/2019   Osteoporosis 11/19/2019   Factor V Leiden (HCC) 11/19/2019   Hypothyroidism 11/19/2019   Anxiety 11/19/2019   Trigger finger of right hand 06/07/2016   Carpal tunnel syndrome, right 02/22/2016   Primary osteoarthritis of right hand 12/30/2015   Degenerative disc disease, cervical 12/30/2015   Trigger finger of left hand 10/08/2015   Pes cavus 11/21/2013   Right lumbar radiculopathy 11/21/2013    PCP: Agapito Games, MD  REFERRING PROVIDER: Monica Becton, MD   REFERRING DIAG: M54.16 (ICD-10-CM) - Right lumbar radiculopathy; foot pain   Rationale for Evaluation and Treatment: Rehabilitation  THERAPY DIAG:  Radiculopathy, lumbar  region  Pain in right ankle and joints of right foot  Muscle weakness (generalized)  Abnormal posture  ONSET DATE: acute on chronic with exacerbation in September 2024  SUBJECTIVE:                                                                                                                                                                                          Patient reports the resisted ankle exercises are aggravating the plantar keratosis on her 5th digit. She got new slides and felt like they were supportive, but then had pain  in her back when she took them off.    Per eval - Patient reports her back pain has been an issue on/off for 40 years of insidious onset.  The pain can radiate from her back down the back of the RLE. The heel is also very painful and has to roll it in the morning to make it feel better. Was told that it might be plantar fascitis from one doctor and another said it was coming from her back. She thinks the heel pain and exacerbation of back pain might be coming from an injury to the 2nd digit from pushing off the wall in the pool about 3.5 months ago. She denies any numbness or tingling. No red flag symptoms. She had an MRI on her back about a year ago and the doctor told her "overall the back looked pretty good."   PERTINENT HISTORY:  Osteoporosis   PAIN:  Are you having pain? Yes: NPRS scale: 5/10 Pain location: Rt posterior hip;Rt achilles insertion  Pain description: aching Aggravating factors: prolonged standing > 30 minutes, lifting Relieving factors: rolling out, TENS, short duration of sitting  PRECAUTIONS: None  WEIGHT BEARING RESTRICTIONS: No  FALLS:  Has patient fallen in last 6 months? No  LIVING ENVIRONMENT: Lives with: lives with their family Lives in: House/apartment Stairs: Yes: Internal: flight steps; on right going up Has following equipment at home: None  OCCUPATION: works part-time, Psychologist, occupational   PLOF:  Independent  PATIENT GOALS: "just to be able to get out of bed ok and stand a little longer."    OBJECTIVE:  Note: Objective measures were completed at Evaluation unless otherwise noted.  DIAGNOSTIC FINDINGS:  None on file   PATIENT SURVEYS:  FOTO 57% function to 64% predicted   SCREENING FOR RED FLAGS: Bowel or bladder incontinence: No  Cauda equina syndrome: No   COGNITION: Overall cognitive status: Within functional limits for tasks assessed     SENSATION: Not tested  MUSCLE LENGTH: Hamstrings: mild tightness bilaterally   POSTURE: decreased lumbar lordosis Rt ribcage lower in standing  Lt pelvis retracted in standing  Pes cavus  PALPATION: TTP proximal plantar fascia/plantar aspect of calcaneous Rt  LUMBAR ROM:   AROM eval  Flexion 10% limited   Extension 25% limited   Right lateral flexion 50% limited  Left lateral flexion 50% limited   Right rotation 50% limited  Left rotation 50% limited    (Blank rows = not tested)  LOWER EXTREMITY ROM:     Active  Right eval Left eval  Hip flexion    Hip extension    Hip abduction    Hip adduction    Hip internal rotation    Hip external rotation    Knee flexion    Knee extension    Ankle dorsiflexion 7   Ankle plantarflexion    Ankle inversion    Ankle eversion     (Blank rows = not tested)  LOWER EXTREMITY MMT:    MMT Right eval Left eval  Hip flexion 4+ 4+  Hip extension 4 4  Hip abduction 4- 4-  Hip adduction    Hip internal rotation    Hip external rotation    Knee flexion 5 5  Knee extension 5 5  Ankle dorsiflexion 5 5  Ankle plantarflexion Partial SL calf raise Partial SL calf raise   Ankle inversion 4 4+  Ankle eversion 4 4+   (Blank rows = not tested)  LUMBAR SPECIAL TESTS:  (-) SLR  FUNCTIONAL TESTS:  Squat: WNL SLS: 30 seconds LLE; 10 seconds RLE Trendelenburg bilateral   GAIT: Distance walked: 20 ft  Assistive device utilized: None Level of assistance: Complete  Independence Comments: limited push-off RLE   [facility] Adult PT Treatment:                                                DATE: 07/10/23 Therapeutic Exercise: Towel slides eversion/inversion x 5 d/c due to 5th digit pain Toe yoga x 10  Toe splay x 10  Seated resisted ankle DF 1 lb ankle weight 2 x 10  Sidelying resisted ankle eversion 1 lb ankle weight 2 x 10  Sidelying hip abduction 2 x 10  Updated HEP    [facility] Adult PT Treatment:                                                DATE: 07/06/23 Therapeutic Exercise: Deficit heel raises BIL UE support 2x12 cues for pacing and mechanics Bosu lunge 2x8 BIL w/ gentle UE support cues for mechanics and appropriate weight shift Green band pull down on airex x10 cues for posture and foot mechanics HEP discussion, significant time spent w/ education/discussion re: rationale for interventions, benefit of strengthening tissues, relevant anatomy/physiology   Manual Therapy: Prone; STM to R gastroc/soleus w/ trigger point release   [facility] Adult PT Treatment:                                                DATE: 07/03/23 Therapeutic Exercise: Resisted plantarflexion black band 2 x 10  Resisted eversion red band 2 x 10  Resisted inversion red band 2 x 10  Updated HEP  Manual Therapy: STM Rt gastroc/soleus Skilled palpation of trigger points  Trigger Point Dry Needling  Initial Treatment: Pt instructed on Dry Needling rational, procedures, and possible side effects. Pt instructed to expect mild to moderate muscle soreness later in the day and/or into the next day.  Pt instructed in methods to reduce muscle soreness. Pt instructed to continue prescribed HEP. Patient verbalized understanding of these instructions and education.   Patient Verbal Consent Given: Yes Education Handout Provided: Yes Muscles Treated: Rt gastroc/soleus  Electrical Stimulation Performed: No Treatment Response/Outcome: twitch response elicited        PATIENT EDUCATION:   Education details: rationale for interventions, HEP  Person educated: Patient Education method: Explanation, Demonstration, Tactile cues, Verbal cues Education comprehension: verbalized understanding, returned demonstration, verbal cues required, tactile cues required, and needs further education      HOME EXERCISE PROGRAM: Access Code: [email or web address] URL: [email or web address] Date: 07/10/2023 Prepared by: Letitia Libra  Program Notes - with great toe flexion, please perform seated as done in clinic- with antirotation press, please do in staggered stance, with forward foot closest to wall, as done in clinic  Exercises - Single Leg Stance  - 1 x daily - 7 x weekly - 3 sets - max  hold - Great Toe Flexion with Resistance  - 1 x daily - 7 x weekly - 2-3 sets - 8 reps - Standing Anti-Rotation Press with Anchored  Resistance  - 1 x daily - 7 x weekly - 2-3 sets - 8-10 reps - Toe Spreading  - 1 x daily - 7 x weekly - 2 sets - 10 reps - Sidelying Hip Abduction  - 1 x daily - 7 x weekly - 2 sets - 10 reps - Sidelying Ankle Eversion Strengthening with Ankle Weight  - 1 x daily - 7 x weekly - 2 sets - 10 reps - Seated Ankle Dorsiflexion with Ankle Weight  - 1 x daily - 7 x weekly - 2 sets - 10 reps  ASSESSMENT:  CLINICAL IMPRESSION: Her 5th digit pain from plantar keratosis limits tolerance to resistance strengthening at the ankle. She reports inability to complete ankle strengthening with resistance band at home and did not tolerate towel slides in session today. Is challenged with intrinsic foot strengthening with inability to complete toe spreading. We were able to complete resisted ankle strengthening with ankle weight around the midfoot without aggravating 5th digit, so this was added to HEP. Difficulty controlling eccentric phase of sidelying hip abduction.   Per eval - Patient is a 68 y.o. female who was seen today for physical therapy evaluation and treatment for chronic low  back/posterior RLE pain and isolated Rt heel pain. She reports her back and RLE pain is a chronic issue, but has recently worsened over the past 3.5 months after injuring her Rt 2nd digit when pushing off the wall when swimming. Upon assessment she is noted to have limited trunk AROM, postural abnormalities, and hip weakness. Specific to the heel she does have palpable tenderness about proximal plantar fascia/plantar aspect of calcaneous, limited DF AROM, bilateral ankle weakness, balance deficits, and gait abnormalities. Did not have time to issue thorough HEP today due to patient being late for evaluation. She will benefit from skilled PT to address the above stated deficits in order to optimize her function and assist in overall pain reduction.    OBJECTIVE IMPAIRMENTS: Abnormal gait, decreased activity tolerance, decreased balance, decreased endurance, decreased knowledge of condition, difficulty walking, decreased ROM, decreased strength, impaired flexibility, improper body mechanics, postural dysfunction, and pain.   ACTIVITY LIMITATIONS: carrying, lifting, standing, squatting, and locomotion level  PARTICIPATION LIMITATIONS: cleaning, laundry, shopping, and community activity  PERSONAL FACTORS: Age, Time since onset of injury/illness/exacerbation, and 1 comorbidity: osteoporosis  are also affecting patient's functional outcome.   REHAB POTENTIAL: Good  CLINICAL DECISION MAKING: Evolving/moderate complexity  EVALUATION COMPLEXITY: Moderate   GOALS: Goals reviewed with patient? Yes  SHORT TERM GOALS: Target date: 07/24/2023  Patient will be independent and compliant with initial HEP.   Baseline: see above Goal status: INITIAL  2.  Patient will demonstrate at least 10 degrees of Rt ankle DF AROM to improve gait mechanics.  Baseline: see above  Goal status: INITIAL  3.  Patient will maintain Rt SLS for at least 20 seconds with no signs of pelvic drop to improve overall stability.   Baseline: see above  Goal status: INITIAL  4.  Patient will complete full range calf raise to improve push-off during gait cycle.  Baseline: see above  Goal status: INITIAL  LONG TERM GOALS: Target date: 08/25/23  Patient will score at least 64% on FOTO to signify clinically meaningful improvement in functional abilities.   Baseline: see above Goal status: INITIAL  2.  Patient will be able to lift at least 15 lbs with proper form without pain.  Baseline: increased pain with lifting.  Goal status: INITIAL  3.  Patient will  self-report tolerating at least 1 hour of standing activity.  Baseline: 30 minutes  Goal status: INITIAL  4.  Patient will demonstrate at least 4+/5 bilateral hip abductor strength to improve stability about the chain with prolonged walking/standing activity.  Baseline: see above Goal status: INITIAL  5.  Patient will be independent with advanced home program to progress/maintain current level of function.  Baseline: see above  Goal status: INITIAL  6.  Patient will demonstrate 5/5 ankle strength to improve gait stability.  Baseline: see above  Goal status: INITIAL  PLAN:  PT FREQUENCY: 1-2x/week  PT DURATION: 8 weeks  PLANNED INTERVENTIONS: 97164- PT Re-evaluation, 97110-Therapeutic exercises, 97530- Therapeutic activity, [id number]- Neuromuscular re-education, 97535- Self Care, [id number]- Manual therapy, [id number]- Gait training, [id number]- Aquatic Therapy, 97014- Electrical stimulation (unattended), [id number]- Electrical stimulation (manual), [id number]- Vasopneumatic device, [id number]- Ionotophoresis 4mg /ml Dexamethasone, Taping, Dry Needling, Cryotherapy, and Moist heat.  PLAN FOR NEXT SESSION: progress HEP to include calf stretch, ankle strengthening, hip strengthening; progress lifting; core strengthening. Address postural abnormalities including manual work/stretching to improve alignment.   Letitia Libra, PT, DPT, ATC 07/10/23 4:16 PM

## 2023-07-12 ENCOUNTER — Ambulatory Visit: Payer: PPO | Admitting: Sports Medicine

## 2023-07-13 ENCOUNTER — Encounter: Payer: PPO | Admitting: Physical Therapy

## 2023-07-13 DIAGNOSIS — M546 Pain in thoracic spine: Secondary | ICD-10-CM | POA: Diagnosis not present

## 2023-07-13 DIAGNOSIS — M79671 Pain in right foot: Secondary | ICD-10-CM | POA: Diagnosis not present

## 2023-07-16 ENCOUNTER — Encounter: Payer: PPO | Admitting: Family Medicine

## 2023-07-17 ENCOUNTER — Encounter: Payer: Self-pay | Admitting: Family Medicine

## 2023-07-17 ENCOUNTER — Ambulatory Visit: Payer: PPO

## 2023-07-17 ENCOUNTER — Ambulatory Visit (INDEPENDENT_AMBULATORY_CARE_PROVIDER_SITE_OTHER): Payer: PPO | Admitting: Family Medicine

## 2023-07-17 VITALS — BP 129/70 | HR 68 | Ht 66.0 in | Wt 136.0 lb

## 2023-07-17 DIAGNOSIS — E039 Hypothyroidism, unspecified: Secondary | ICD-10-CM

## 2023-07-17 DIAGNOSIS — M818 Other osteoporosis without current pathological fracture: Secondary | ICD-10-CM | POA: Diagnosis not present

## 2023-07-17 DIAGNOSIS — M25571 Pain in right ankle and joints of right foot: Secondary | ICD-10-CM

## 2023-07-17 DIAGNOSIS — Z0000 Encounter for general adult medical examination without abnormal findings: Secondary | ICD-10-CM

## 2023-07-17 DIAGNOSIS — E618 Deficiency of other specified nutrient elements: Secondary | ICD-10-CM

## 2023-07-17 DIAGNOSIS — R790 Abnormal level of blood mineral: Secondary | ICD-10-CM

## 2023-07-17 DIAGNOSIS — D539 Nutritional anemia, unspecified: Secondary | ICD-10-CM | POA: Diagnosis not present

## 2023-07-17 DIAGNOSIS — R293 Abnormal posture: Secondary | ICD-10-CM

## 2023-07-17 DIAGNOSIS — M5416 Radiculopathy, lumbar region: Secondary | ICD-10-CM

## 2023-07-17 DIAGNOSIS — M6281 Muscle weakness (generalized): Secondary | ICD-10-CM

## 2023-07-17 NOTE — Therapy (Signed)
OUTPATIENT PHYSICAL THERAPY TREATMENT   Patient Name: Sheila Harris MRN: [id number] DOB:02/09/1956,68 y.o., female Today's Date: 07/17/2023   END OF SESSION:  PT End of Session - 07/17/23 0941     Visit Number 6    Number of Visits 17    Date for PT Re-Evaluation 08/25/23    Authorization Type [employer]    Progress Note Due on Visit 10    PT Start Time [phone]   patient late   PT Stop Time 1015    PT Time Calculation (min) 33 min    Activity Tolerance Patient tolerated treatment well    Behavior During Therapy [facility] for tasks assessed/performed                   Past Medical History:  Diagnosis Date   Genital herpes    Macular degeneration    Thyroid disease    History reviewed. No pertinent surgical history. Patient Active Problem List   Diagnosis Date Noted   Chronic pain of left wrist 05/07/2023   Anxiety disorder 01/11/2023   Contusion of knee, left 11/09/2022   Metatarsalgia of right foot 03/06/2022   Iron deficiency 02/23/2022   Dry age-related macular degeneration 12/08/2021   Chronic cough 08/11/2021   Chronic foot pain, right 07/12/2021   Left shoulder pain 05/05/2021   Hormone replacement therapy (HRT) 07/08/2020   Corn of foot right, 4/5 interspace 06/07/2020   Herpes infection 11/19/2019   Osteoporosis 11/19/2019   Factor V Leiden (HCC) 11/19/2019   Hypothyroidism 11/19/2019   Anxiety 11/19/2019   Trigger finger of right hand 06/07/2016   Carpal tunnel syndrome, right 02/22/2016   Primary osteoarthritis of right hand 12/30/2015   Degenerative disc disease, cervical 12/30/2015   Trigger finger of left hand 10/08/2015   Pes cavus 11/21/2013   Right lumbar radiculopathy 11/21/2013    PCP: Agapito Games, MD  REFERRING PROVIDER: Monica Becton, MD   REFERRING DIAG: M54.16 (ICD-10-CM) - Right lumbar radiculopathy; foot pain   Rationale for Evaluation and Treatment: Rehabilitation  THERAPY DIAG:  Radiculopathy,  lumbar region  Pain in right ankle and joints of right foot  Muscle weakness (generalized)  Abnormal posture  ONSET DATE: acute on chronic with exacerbation in September 2024  SUBJECTIVE:                                                                                                                                                                                          "Yesterday I was a 10 all day long down the right leg." She iced and massaged on the ball, which helped.    Per  eval - Patient reports her back pain has been an issue on/off for 40 years of insidious onset.  The pain can radiate from her back down the back of the RLE. The heel is also very painful and has to roll it in the morning to make it feel better. Was told that it might be plantar fascitis from one doctor and another said it was coming from her back. She thinks the heel pain and exacerbation of back pain might be coming from an injury to the 2nd digit from pushing off the wall in the pool about 3.5 months ago. She denies any numbness or tingling. No red flag symptoms. She had an MRI on her back about a year ago and the doctor told her "overall the back looked pretty good."   PERTINENT HISTORY:  Osteoporosis   PAIN:  Are you having pain? Yes: NPRS scale: 4/10 Pain location: Rt posterior hip radiating to the foot.  Pain description: aching Aggravating factors: prolonged standing > 30 minutes, lifting Relieving factors: rolling out, TENS, short duration of sitting  PRECAUTIONS: None  WEIGHT BEARING RESTRICTIONS: No  FALLS:  Has patient fallen in last 6 months? No  LIVING ENVIRONMENT: Lives with: lives with their family Lives in: House/apartment Stairs: Yes: Internal: flight steps; on right going up Has following equipment at home: None  OCCUPATION: works part-time, Psychologist, occupational   PLOF: Independent  PATIENT GOALS: "just to be able to get out of bed ok and stand a little longer."    OBJECTIVE:   Note: Objective measures were completed at Evaluation unless otherwise noted.  DIAGNOSTIC FINDINGS:  None on file   PATIENT SURVEYS:  FOTO 57% function to 64% predicted   SCREENING FOR RED FLAGS: Bowel or bladder incontinence: No  Cauda equina syndrome: No   COGNITION: Overall cognitive status: Within functional limits for tasks assessed     SENSATION: Not tested  MUSCLE LENGTH: Hamstrings: mild tightness bilaterally   POSTURE: decreased lumbar lordosis Rt ribcage lower in standing  Lt pelvis retracted in standing  Pes cavus  PALPATION: TTP proximal plantar fascia/plantar aspect of calcaneous Rt  LUMBAR ROM:   AROM eval  Flexion 10% limited   Extension 25% limited   Right lateral flexion 50% limited  Left lateral flexion 50% limited   Right rotation 50% limited  Left rotation 50% limited    (Blank rows = not tested)  LOWER EXTREMITY ROM:     Active  Right eval Left eval  Hip flexion    Hip extension    Hip abduction    Hip adduction    Hip internal rotation    Hip external rotation    Knee flexion    Knee extension    Ankle dorsiflexion 7   Ankle plantarflexion    Ankle inversion    Ankle eversion     (Blank rows = not tested)  LOWER EXTREMITY MMT:    MMT Right eval Left eval  Hip flexion 4+ 4+  Hip extension 4 4  Hip abduction 4- 4-  Hip adduction    Hip internal rotation    Hip external rotation    Knee flexion 5 5  Knee extension 5 5  Ankle dorsiflexion 5 5  Ankle plantarflexion Partial SL calf raise Partial SL calf raise   Ankle inversion 4 4+  Ankle eversion 4 4+   (Blank rows = not tested)  LUMBAR SPECIAL TESTS:  (-) SLR   FUNCTIONAL TESTS:  Squat: WNL SLS: 30 seconds LLE;  10 seconds RLE Trendelenburg bilateral   GAIT: Distance walked: 20 ft  Assistive device utilized: None Level of assistance: Complete Independence Comments: limited push-off RLE  [facility] Adult PT Treatment:                                                 DATE: 07/17/23 Therapeutic Exercise: Sciatic nerve glides RLE x 10  Hip bridge with abduction blue band 2 x 10  SLR 2 x 10  Updated HEP    [facility] Adult PT Treatment:                                                DATE: 07/10/23 Therapeutic Exercise: Towel slides eversion/inversion x 5 d/c due to 5th digit pain Toe yoga x 10  Toe splay x 10  Seated resisted ankle DF 1 lb ankle weight 2 x 10  Sidelying resisted ankle eversion 1 lb ankle weight 2 x 10  Sidelying hip abduction 2 x 10  Updated HEP    [facility] Adult PT Treatment:                                                DATE: 07/06/23 Therapeutic Exercise: Deficit heel raises BIL UE support 2x12 cues for pacing and mechanics Bosu lunge 2x8 BIL w/ gentle UE support cues for mechanics and appropriate weight shift Green band pull down on airex x10 cues for posture and foot mechanics HEP discussion, significant time spent w/ education/discussion re: rationale for interventions, benefit of strengthening tissues, relevant anatomy/physiology   Manual Therapy: Prone; STM to R gastroc/soleus w/ trigger point release       PATIENT EDUCATION:  Education details: rationale for interventions, HEP  Person educated: Patient Education method: Explanation, Demonstration, Tactile cues, Verbal cues, handout  Education comprehension: verbalized understanding, returned demonstration, verbal cues required, tactile cues required, and needs further education      HOME EXERCISE PROGRAM: Access Code: [email or web address] URL: [email or web address] Date: 07/17/2023 Prepared by: Letitia Libra  Program Notes - with great toe flexion, please perform seated as done in clinic- with antirotation press, please do in staggered stance, with forward foot closest to wall, as done in clinic  Exercises - Single Leg Stance  - 1 x daily - 7 x weekly - 3 sets - max  hold - Great Toe Flexion with Resistance  - 1 x daily - 7 x weekly - 2-3 sets - 8 reps - Standing  Anti-Rotation Press with Anchored Resistance  - 1 x daily - 7 x weekly - 2-3 sets - 8-10 reps - Toe Spreading  - 1 x daily - 7 x weekly - 2 sets - 10 reps - Sidelying Hip Abduction  - 1 x daily - 7 x weekly - 2 sets - 10 reps - Sidelying Ankle Eversion Strengthening with Ankle Weight  - 1 x daily - 7 x weekly - 2 sets - 10 reps - Seated Ankle Dorsiflexion with Ankle Weight  - 1 x daily - 7 x weekly - 2 sets - 10 reps - Supine Sciatic Nerve Glide  - 1  x daily - 7 x weekly - 1 sets - 10 reps - Bridge with Hip Abduction and Resistance  - 1 x daily - 7 x weekly - 2 sets - 10 reps - Active Straight Leg Raise with Quad Set  - 1 x daily - 7 x weekly - 2 sets - 10 reps  ASSESSMENT:  CLINICAL IMPRESSION: Session somewhat limited as patient was late for scheduled visit. She reported an increase in radicular pain yesterday about the RLE that improved with ice and soft tissue mobilization. We focused on core/hip strengthening today without an increase in pain. She does require cues for firing of proper musculature with targeted hip strengthening.   Per eval - Patient is a 68 y.o. female who was seen today for physical therapy evaluation and treatment for chronic low back/posterior RLE pain and isolated Rt heel pain. She reports her back and RLE pain is a chronic issue, but has recently worsened over the past 3.5 months after injuring her Rt 2nd digit when pushing off the wall when swimming. Upon assessment she is noted to have limited trunk AROM, postural abnormalities, and hip weakness. Specific to the heel she does have palpable tenderness about proximal plantar fascia/plantar aspect of calcaneous, limited DF AROM, bilateral ankle weakness, balance deficits, and gait abnormalities. Did not have time to issue thorough HEP today due to patient being late for evaluation. She will benefit from skilled PT to address the above stated deficits in order to optimize her function and assist in overall pain reduction.     OBJECTIVE IMPAIRMENTS: Abnormal gait, decreased activity tolerance, decreased balance, decreased endurance, decreased knowledge of condition, difficulty walking, decreased ROM, decreased strength, impaired flexibility, improper body mechanics, postural dysfunction, and pain.   ACTIVITY LIMITATIONS: carrying, lifting, standing, squatting, and locomotion level  PARTICIPATION LIMITATIONS: cleaning, laundry, shopping, and community activity  PERSONAL FACTORS: Age, Time since onset of injury/illness/exacerbation, and 1 comorbidity: osteoporosis  are also affecting patient's functional outcome.   REHAB POTENTIAL: Good  CLINICAL DECISION MAKING: Evolving/moderate complexity  EVALUATION COMPLEXITY: Moderate   GOALS: Goals reviewed with patient? Yes  SHORT TERM GOALS: Target date: 07/24/2023  Patient will be independent and compliant with initial HEP.   Baseline: see above Goal status: INITIAL  2.  Patient will demonstrate at least 10 degrees of Rt ankle DF AROM to improve gait mechanics.  Baseline: see above  Goal status: INITIAL  3.  Patient will maintain Rt SLS for at least 20 seconds with no signs of pelvic drop to improve overall stability.  Baseline: see above  Goal status: INITIAL  4.  Patient will complete full range calf raise to improve push-off during gait cycle.  Baseline: see above  Goal status: INITIAL  LONG TERM GOALS: Target date: 08/25/23  Patient will score at least 64% on FOTO to signify clinically meaningful improvement in functional abilities.   Baseline: see above Goal status: INITIAL  2.  Patient will be able to lift at least 15 lbs with proper form without pain.  Baseline: increased pain with lifting.  Goal status: INITIAL  3.  Patient will self-report tolerating at least 1 hour of standing activity.  Baseline: 30 minutes  Goal status: INITIAL  4.  Patient will demonstrate at least 4+/5 bilateral hip abductor strength to improve stability about the  chain with prolonged walking/standing activity.  Baseline: see above Goal status: INITIAL  5.  Patient will be independent with advanced home program to progress/maintain current level of function.  Baseline: see above  Goal status: INITIAL  6.  Patient will demonstrate 5/5 ankle strength to improve gait stability.  Baseline: see above  Goal status: INITIAL  PLAN:  PT FREQUENCY: 1-2x/week  PT DURATION: 8 weeks  PLANNED INTERVENTIONS: 97164- PT Re-evaluation, 97110-Therapeutic exercises, 97530- Therapeutic activity, [id number]- Neuromuscular re-education, 97535- Self Care, [id number]- Manual therapy, [id number]- Gait training, [id number]- Aquatic Therapy, 97014- Electrical stimulation (unattended), [id number]- Electrical stimulation (manual), [id number]- Vasopneumatic device, [id number]- Ionotophoresis 4mg /ml Dexamethasone, Taping, Dry Needling, Cryotherapy, and Moist heat.  PLAN FOR NEXT SESSION: review and progress HEP prn; consider LAD to RLE; core/hip strengthening; Rt ankle strengthening. Modalities prn. Check STG and FOTO.   Letitia Libra, PT, DPT, ATC 07/17/23 10:18 AM

## 2023-07-17 NOTE — Progress Notes (Signed)
Complete physical exam  Patient: Sheila Harris   DOB: 1955-06-27   68 y.o. Female  MRN: [id number]  Subjective:    Chief Complaint  Patient presents with   Annual Exam    Sheila Harris is a 68 y.o. female who presents today for a complete physical exam. She reports consuming a general diet.  She does exercise.   She generally feels well. She reports sleeping fairly well. She does not have additional problems to discuss today.   She would like to have Vit D and B12 checkeck if possible.      Most recent fall risk assessment:    01/11/2023    3:03 PM  Fall Risk   Falls in the past year? 0  Number falls in past yr: 0  Injury with Fall? 0  Risk for fall due to : No Fall Risks  Follow up Falls evaluation completed     Most recent depression screenings:    01/11/2023    3:03 PM 07/13/2022    9:07 AM  PHQ 2/9 Scores  PHQ - 2 Score 2 0         Patient Care Team: Agapito Games, MD as PCP - General (Family Medicine) Hecox, Katlin (Psychology) Sharrell Ku, MD as Consulting Physician (Gastroenterology) Carmela Rima, MD as Consulting Physician (Ophthalmology)   Outpatient Medications Prior to Visit  Medication Sig   acyclovir ointment (ZOVIRAX) 5 % Apply 1 Application topically every 3 (three) hours.   AMBULATORY NON FORMULARY MEDICATION Medication Name: Estradiol 2mg /Gm Cream. Apply 2 clicks twice daily to inner thighs.   ascorbic acid (VITAMIN C) 100 MG tablet Take by mouth.   b complex vitamins tablet Take by mouth.   CALCIUM PO Take 1,500 mg by mouth daily.   clonazePAM (KLONOPIN) 0.5 MG tablet TAKE 1/2 TABLET BY MOUTH EVERY NIGHT AT BEDTIME AS NEEDED FOR ANXIETY   Collagen Hydrolysate POWD by Does not apply route.   FLUoxetine (PROZAC) 20 MG tablet Take 0.5 tablets (10 mg total) by mouth daily for 10 days, THEN 1 tablet (20 mg total) daily for 20 days.   gabapentin (NEURONTIN) 100 MG capsule Take 1 capsule (100 mg total) by mouth 3 (three) times daily  as needed.   IBU 600 MG tablet TAKE 1 TABLET BY MOUTH EVERY 8 HOURS AS NEEDED   levothyroxine (SYNTHROID) 75 MCG tablet TAKE 1 TABLET BY MOUTH EVERY MORNING BEFORE BREAKFAST EXCEPT TAKE 1/2 TABLET BY MOUTH ON SUNDAY   liothyronine (CYTOMEL) 25 MCG tablet TAKE 1/2 TABLET BY MOUTH DAILY   MAGNESIUM PO Take 1,000 mg by mouth daily.   Multiple Vitamin (THERA) TABS Take by mouth.   progesterone (PROMETRIUM) 100 MG capsule TAKE ONE CAPSULE BY MOUTH EVERY NIGHT AT BEDTIME   rizatriptan (MAXALT) 5 MG tablet Take 1 tablet (5 mg total) by mouth as needed for migraine. May repeat in 2 hours if needed   valACYclovir (VALTREX) 500 MG tablet TAKE 1 TABLET BY MOUTH 2 TIMES A DAY   VITAMIN D PO Take 2,500 mg by mouth daily.   No facility-administered medications prior to visit.    ROS        Objective:     BP 129/70   Pulse 68   Ht 5\' 6"  (1.676 m)   Wt 136 lb (61.7 kg)   LMP 04/03/2010   SpO2 98%   BMI 21.95 kg/m     Physical Exam Vitals and nursing note reviewed.  Constitutional:  Appearance: Normal appearance.  HENT:     Head: Normocephalic and atraumatic.     Right Ear: Tympanic membrane, ear canal and external ear normal.     Left Ear: Tympanic membrane, ear canal and external ear normal.     Nose: Nose normal.     Mouth/Throat:     Pharynx: Oropharynx is clear.  Eyes:     Extraocular Movements: Extraocular movements intact.     Conjunctiva/sclera: Conjunctivae normal.     Pupils: Pupils are equal, round, and reactive to light.  Neck:     Thyroid: No thyromegaly.  Cardiovascular:     Rate and Rhythm: Normal rate and regular rhythm.  Pulmonary:     Effort: Pulmonary effort is normal.     Breath sounds: Normal breath sounds.  Abdominal:     General: Bowel sounds are normal.     Palpations: Abdomen is soft.     Tenderness: There is no abdominal tenderness.  Musculoskeletal:        General: No swelling.     Cervical back: Neck supple.  Skin:    General: Skin is warm  and dry.  Neurological:     Mental Status: She is alert and oriented to person, place, and time.  Psychiatric:        Mood and Affect: Mood normal.        Behavior: Behavior normal.      No results found for any visits on 07/17/23.      Assessment & Plan:    Routine Health Maintenance and Physical Exam  Immunization History  Administered Date(s) Administered   Influenza Split 03/25/2017, 03/15/2018, 02/12/2019, 04/06/2020   Influenza, High Dose Seasonal PF 03/02/2021   Influenza,inj,Quad PF,6+ Mos 03/15/2018   PFIZER(Purple Top)SARS-COV-2 Vaccination 08/22/2019, 09/15/2019, 05/04/2020   Pfizer Covid-19 Vaccine Bivalent Booster 60yrs & up 03/04/2021   Td 06/19/2002   Tdap 04/19/2012   Zoster, Live 01/14/2015    Health Maintenance  Topic Date Due   Hepatitis C Screening  Never done   Zoster Vaccines- Shingrix (1 of 2) 12/09/2004   Pneumonia Vaccine 48+ Years old (1 of 1 - PCV) Never done   Medicare Annual Wellness (AWV)  02/10/2023   COVID-19 Vaccine (5 - 2024-25 season) 02/18/2023   INFLUENZA VACCINE  09/17/2023 (Originally 01/18/2023)   Colonoscopy  11/23/2024   MAMMOGRAM  03/21/2025   DEXA SCAN  Completed   HPV VACCINES  Aged Out   DTaP/Tdap/Td  Discontinued    Discussed health benefits of physical activity, and encouraged her to engage in regular exercise appropriate for her age and condition.  Problem List Items Addressed This Visit       Endocrine   Hypothyroidism   Relevant Orders   CMP14+EGFR   Lipid panel   CBC   Hepatitis C Antibody   Fe+TIBC+Fer   TSH     Musculoskeletal and Integument   Osteoporosis   Relevant Orders   VITAMIN D 25 Hydroxy (Vit-D Deficiency, Fractures)   Other Visit Diagnoses       Wellness examination    -  Primary   Relevant Orders   CMP14+EGFR   Lipid panel   CBC   Hepatitis C Antibody   Fe+TIBC+Fer   TSH     Low ferritin       Relevant Orders   Fe+TIBC+Fer     Mineral deficiency       Relevant Orders   B12        Keep up a regular exercise  program and make sure you are eating a healthy diet Try to eat 4 servings of dairy a day, or if you are lactose intolerant take a calcium with vitamin D daily.  Your vaccines are up to date.   Will also try to go ahead and get her scheduled for a Medicare annual wellness visit.  Return in about 1 month (around 08/17/2023) for schedule AWV with nurse schedule. [name], MD

## 2023-07-18 ENCOUNTER — Encounter: Payer: Self-pay | Admitting: Family Medicine

## 2023-07-18 LAB — CMP14+EGFR
ALT: 21 [IU]/L (ref 0–32)
AST: 25 [IU]/L (ref 0–40)
Albumin: 4.2 g/dL (ref 3.9–4.9)
Alkaline Phosphatase: 82 [IU]/L (ref 44–121)
BUN/Creatinine Ratio: 26 (ref 12–28)
BUN: 17 mg/dL (ref 8–27)
Bilirubin Total: 0.6 mg/dL (ref 0.0–1.2)
CO2: 24 mmol/L (ref 20–29)
Calcium: 9 mg/dL (ref 8.7–10.3)
Chloride: 104 mmol/L (ref 96–106)
Creatinine, Ser: 0.65 mg/dL (ref 0.57–1.00)
Globulin, Total: 1.9 g/dL (ref 1.5–4.5)
Glucose: 83 mg/dL (ref 70–99)
Potassium: 5.1 mmol/L (ref 3.5–5.2)
Sodium: 141 mmol/L (ref 134–144)
Total Protein: 6.1 g/dL (ref 6.0–8.5)
eGFR: 96 mL/min/{1.73_m2} (ref >59)

## 2023-07-18 LAB — LIPID PANEL
Chol/HDL Ratio: 2.5 {ratio} (ref 0.0–4.4)
Cholesterol, Total: 159 mg/dL (ref 100–199)
HDL: 64 mg/dL (ref >39)
LDL Chol Calc (NIH): 84 mg/dL (ref 0–99)
Triglycerides: 55 mg/dL (ref 0–149)
VLDL Cholesterol Cal: 11 mg/dL (ref 5–40)

## 2023-07-18 LAB — HEPATITIS C ANTIBODY: Hep C Virus Ab: NONREACTIVE

## 2023-07-18 LAB — CBC
Hematocrit: 43.7 % (ref 34.0–46.6)
Hemoglobin: 14.7 g/dL (ref 11.1–15.9)
MCH: 33.6 pg — ABNORMAL HIGH (ref 26.6–33.0)
MCHC: 33.6 g/dL (ref 31.5–35.7)
MCV: 100 fL — ABNORMAL HIGH (ref 79–97)
Platelets: 240 10*3/uL (ref 150–450)
RBC: 4.38 x10E6/uL (ref 3.77–5.28)
RDW: 11.7 % (ref 11.7–15.4)
WBC: 6.9 10*3/uL (ref 3.4–10.8)

## 2023-07-18 LAB — VITAMIN D 25 HYDROXY (VIT D DEFICIENCY, FRACTURES): Vit D, 25-Hydroxy: 105 ng/mL — ABNORMAL HIGH (ref 30.0–100.0)

## 2023-07-18 LAB — IRON,TIBC AND FERRITIN PANEL
Ferritin: 52 ng/mL (ref 15–150)
Iron Saturation: 39 % (ref 15–55)
Iron: 122 ug/dL (ref 27–139)
Total Iron Binding Capacity: 316 ug/dL (ref 250–450)
UIBC: 194 ug/dL (ref 118–369)

## 2023-07-18 LAB — TSH: TSH: 1.04 u[IU]/mL (ref 0.450–4.500)

## 2023-07-18 LAB — VITAMIN B12: Vitamin B-12: 635 pg/mL (ref 232–1245)

## 2023-07-18 NOTE — Telephone Encounter (Signed)
Call lab and see if we can add a folate level.

## 2023-07-18 NOTE — Telephone Encounter (Signed)
Added labs

## 2023-07-18 NOTE — Progress Notes (Signed)
Hi Byanca, no anemia.  Vitamin d is actually high so you can decrease how often you are taking it.  Too high of vitamin D levels can actually be harmful.  Your metabolic panel looks great.  Cholesterol looks great.  Iron looks better than last time so great job in that.  Your thyroid and B12 look great.  Negative for hepatitis C.

## 2023-07-18 NOTE — Therapy (Signed)
OUTPATIENT PHYSICAL THERAPY TREATMENT   Patient Name: CLARISSIA MCKEEN MRN: [id number] DOB:1956-05-07,67 y.o., female Today's Date: 07/19/2023   END OF SESSION:  PT End of Session - 07/19/23 1105     Visit Number 7    Number of Visits 17    Date for PT Re-Evaluation 08/25/23    Authorization Type [employer]    Progress Note Due on Visit 10    PT Start Time 1109   late check in   PT Stop Time 1148    PT Time Calculation (min) 39 min    Activity Tolerance Patient tolerated treatment well                    Past Medical History:  Diagnosis Date   Genital herpes    Macular degeneration    Thyroid disease    History reviewed. No pertinent surgical history. Patient Active Problem List   Diagnosis Date Noted   Chronic pain of left wrist 05/07/2023   Anxiety disorder 01/11/2023   Contusion of knee, left 11/09/2022   Metatarsalgia of right foot 03/06/2022   Iron deficiency 02/23/2022   Dry age-related macular degeneration 12/08/2021   Chronic cough 08/11/2021   Chronic foot pain, right 07/12/2021   Left shoulder pain 05/05/2021   Hormone replacement therapy (HRT) 07/08/2020   Corn of foot right, 4/5 interspace 06/07/2020   Herpes infection 11/19/2019   Osteoporosis 11/19/2019   Factor V Leiden (HCC) 11/19/2019   Hypothyroidism 11/19/2019   Anxiety 11/19/2019   Trigger finger of right hand 06/07/2016   Carpal tunnel syndrome, right 02/22/2016   Primary osteoarthritis of right hand 12/30/2015   Degenerative disc disease, cervical 12/30/2015   Trigger finger of left hand 10/08/2015   Pes cavus 11/21/2013   Right lumbar radiculopathy 11/21/2013    PCP: Agapito Games, MD  REFERRING PROVIDER: Monica Becton, MD   REFERRING DIAG: M54.16 (ICD-10-CM) - Right lumbar radiculopathy; foot pain   Rationale for Evaluation and Treatment: Rehabilitation  THERAPY DIAG:  Radiculopathy, lumbar region  Pain in right ankle and joints of right  foot  Muscle weakness (generalized)  ONSET DATE: acute on chronic with exacerbation in September 2024  SUBJECTIVE:                                                                                                                                                                                          07/19/2023 Pt states sore on her toe is improving, still bothering her some but also has noted that wearing hiking boots tend to improve her tolerance to pressure and ankle exercises. Also got some new orthotics.  7/10 back/LE pain today.     Per eval - Patient reports her back pain has been an issue on/off for 40 years of insidious onset.  The pain can radiate from her back down the back of the RLE. The heel is also very painful and has to roll it in the morning to make it feel better. Was told that it might be plantar fascitis from one doctor and another said it was coming from her back. She thinks the heel pain and exacerbation of back pain might be coming from an injury to the 2nd digit from pushing off the wall in the pool about 3.5 months ago. She denies any numbness or tingling. No red flag symptoms. She had an MRI on her back about a year ago and the doctor told her "overall the back looked pretty good."   PERTINENT HISTORY:  Osteoporosis   PAIN:  Are you having pain? Yes: NPRS scale:7/10 Pain location: Rt posterior hip radiating to the foot.  Pain description: aching Aggravating factors: prolonged standing > 30 minutes, lifting Relieving factors: rolling out, TENS, short duration of sitting  PRECAUTIONS: None  WEIGHT BEARING RESTRICTIONS: No  FALLS:  Has patient fallen in last 6 months? No  LIVING ENVIRONMENT: Lives with: lives with their family Lives in: House/apartment Stairs: Yes: Internal: flight steps; on right going up Has following equipment at home: None  OCCUPATION: works part-time, Psychologist, occupational   PLOF: Independent  PATIENT GOALS: "just to be able to get out of  bed ok and stand a little longer."    OBJECTIVE:  Note: Objective measures were completed at Evaluation unless otherwise noted.  DIAGNOSTIC FINDINGS:  None on file   PATIENT SURVEYS:  FOTO 57% function to 64% predicted   SCREENING FOR RED FLAGS: Bowel or bladder incontinence: No  Cauda equina syndrome: No   COGNITION: Overall cognitive status: Within functional limits for tasks assessed     SENSATION: Not tested  MUSCLE LENGTH: Hamstrings: mild tightness bilaterally   POSTURE: decreased lumbar lordosis Rt ribcage lower in standing  Lt pelvis retracted in standing  Pes cavus  PALPATION: TTP proximal plantar fascia/plantar aspect of calcaneous Rt  LUMBAR ROM:   AROM eval  Flexion 10% limited   Extension 25% limited   Right lateral flexion 50% limited  Left lateral flexion 50% limited   Right rotation 50% limited  Left rotation 50% limited    (Blank rows = not tested)  LOWER EXTREMITY ROM:     Active  Right eval Left eval Right 07/19/23  Hip flexion     Hip extension     Hip abduction     Hip adduction     Hip internal rotation     Hip external rotation     Knee flexion     Knee extension     Ankle dorsiflexion 7  8 deg painless  Ankle plantarflexion     Ankle inversion     Ankle eversion      (Blank rows = not tested)  LOWER EXTREMITY MMT:    MMT Right eval Left eval  Hip flexion 4+ 4+  Hip extension 4 4  Hip abduction 4- 4-  Hip adduction    Hip internal rotation    Hip external rotation    Knee flexion 5 5  Knee extension 5 5  Ankle dorsiflexion 5 5  Ankle plantarflexion Partial SL calf raise Partial SL calf raise   Ankle inversion 4 4+  Ankle eversion 4 4+   (  Blank rows = not tested)  LUMBAR SPECIAL TESTS:  (-) SLR   FUNCTIONAL TESTS:  Squat: WNL SLS: 30 seconds LLE; 10 seconds RLE Trendelenburg bilateral   GAIT: Distance walked: 20 ft  Assistive device utilized: None Level of assistance: Complete Independence Comments:  limited push-off RLE  [facility] Adult PT Treatment:                                                DATE: 07/19/23 Therapeutic Exercise: Doorway QL stretch R side 2x5 w/ breath control  Doorway rhomboid stretch x8  High>low row green band 2x12, blue band x10 Suitcase carry 5# KB 2x91ft BIL, 7# DB x8ft cues for posture  R sided sciatic nerve glide x15 HEP discussion/education  Therapeutic Activity: Checking STG, education/discussion re: progress w/ PT thus far, symptom behavior as it affects activities outside of sessions, relevant anatomy/physiology as it pertains to symptom behavior    [facility] Adult PT Treatment:                                                DATE: 07/17/23 Therapeutic Exercise: Sciatic nerve glides RLE x 10  Hip bridge with abduction blue band 2 x 10  SLR 2 x 10  Updated HEP    [facility] Adult PT Treatment:                                                DATE: 07/10/23 Therapeutic Exercise: Towel slides eversion/inversion x 5 d/c due to 5th digit pain Toe yoga x 10  Toe splay x 10  Seated resisted ankle DF 1 lb ankle weight 2 x 10  Sidelying resisted ankle eversion 1 lb ankle weight 2 x 10  Sidelying hip abduction 2 x 10  Updated HEP    [facility] Adult PT Treatment:                                                DATE: 07/06/23 Therapeutic Exercise: Deficit heel raises BIL UE support 2x12 cues for pacing and mechanics Bosu lunge 2x8 BIL w/ gentle UE support cues for mechanics and appropriate weight shift Green band pull down on airex x10 cues for posture and foot mechanics HEP discussion, significant time spent w/ education/discussion re: rationale for interventions, benefit of strengthening tissues, relevant anatomy/physiology   Manual Therapy: Prone; STM to R gastroc/soleus w/ trigger point release    PATIENT EDUCATION:  Education details: rationale for interventions, HEP  Person educated: Patient Education method: Explanation, Demonstration, Tactile cues, Verbal cues,  handout  Education comprehension: verbalized understanding, returned demonstration, verbal cues required, tactile cues required, and needs further education      HOME EXERCISE PROGRAM: Access Code: [email or web address] URL: [email or web address] Date: 07/17/2023 Prepared by: Letitia Libra  Program Notes - with great toe flexion, please perform seated as done in clinic- with antirotation press, please do in staggered stance, with forward foot closest to wall, as done in clinic  Exercises - Single  Leg Stance  - 1 x daily - 7 x weekly - 3 sets - max  hold - Great Toe Flexion with Resistance  - 1 x daily - 7 x weekly - 2-3 sets - 8 reps - Standing Anti-Rotation Press with Anchored Resistance  - 1 x daily - 7 x weekly - 2-3 sets - 8-10 reps - Toe Spreading  - 1 x daily - 7 x weekly - 2 sets - 10 reps - Sidelying Hip Abduction  - 1 x daily - 7 x weekly - 2 sets - 10 reps - Sidelying Ankle Eversion Strengthening with Ankle Weight  - 1 x daily - 7 x weekly - 2 sets - 10 reps - Seated Ankle Dorsiflexion with Ankle Weight  - 1 x daily - 7 x weekly - 2 sets - 10 reps - Supine Sciatic Nerve Glide  - 1 x daily - 7 x weekly - 1 sets - 10 reps - Bridge with Hip Abduction and Resistance  - 1 x daily - 7 x weekly - 2 sets - 10 reps - Active Straight Leg Raise with Quad Set  - 1 x daily - 7 x weekly - 2 sets - 10 reps  ASSESSMENT:  CLINICAL IMPRESSION: 07/19/2023 Pt arrives w/ 7/10 pain on NPS, reports improving toe pain. Notes nerve glides have been helpful. In discussion w/ pt today, would like to focus on hip/back over next couple visits. Does well with interventions working on thoracolumbar mobility with concordant stretching sensation along R QL. Following this with core strengthening - pt tolerates well although she does endorse some mild R pec tightness/discomfort with suitcase carries. She reports this is reproduced with self palpation along sternal pec major attachment and has occurred often with  UE strength exercises - she denies any other concerning symptoms and mentions she has discussed this with providers before to ensure no concerning etiology. Denies any increase in pain on departure and reports good relief with nerve glides at end of session. Recommend continuing along current POC in order to address relevant deficits and improve functional tolerance. Pt departs today's session in no acute distress, all voiced questions/concerns addressed appropriately from PT perspective.    Per eval - Patient is a 68 y.o. female who was seen today for physical therapy evaluation and treatment for chronic low back/posterior RLE pain and isolated Rt heel pain. She reports her back and RLE pain is a chronic issue, but has recently worsened over the past 3.5 months after injuring her Rt 2nd digit when pushing off the wall when swimming. Upon assessment she is noted to have limited trunk AROM, postural abnormalities, and hip weakness. Specific to the heel she does have palpable tenderness about proximal plantar fascia/plantar aspect of calcaneous, limited DF AROM, bilateral ankle weakness, balance deficits, and gait abnormalities. Did not have time to issue thorough HEP today due to patient being late for evaluation. She will benefit from skilled PT to address the above stated deficits in order to optimize her function and assist in overall pain reduction.    OBJECTIVE IMPAIRMENTS: Abnormal gait, decreased activity tolerance, decreased balance, decreased endurance, decreased knowledge of condition, difficulty walking, decreased ROM, decreased strength, impaired flexibility, improper body mechanics, postural dysfunction, and pain.   ACTIVITY LIMITATIONS: carrying, lifting, standing, squatting, and locomotion level  PARTICIPATION LIMITATIONS: cleaning, laundry, shopping, and community activity  PERSONAL FACTORS: Age, Time since onset of injury/illness/exacerbation, and 1 comorbidity: osteoporosis  are also  affecting patient's functional outcome.   REHAB POTENTIAL:  Good  CLINICAL DECISION MAKING: Evolving/moderate complexity  EVALUATION COMPLEXITY: Moderate   GOALS: Goals reviewed with patient? Yes  SHORT TERM GOALS: Target date: 07/24/2023  Patient will be independent and compliant with initial HEP.   Baseline: see above 07/19/23: reports ongoing HEP performance  Goal status: ONGOING  2.  Patient will demonstrate at least 10 degrees of Rt ankle DF AROM to improve gait mechanics.  Baseline: see above  07/19/23: 8 deg DF R ankle  Goal status: NEARLY MET   3.  Patient will maintain Rt SLS for at least 20 seconds with no signs of pelvic drop to improve overall stability.  Baseline: see above  07/18/22: pt requests to defer today Goal status: ONGOING  4.  Patient will complete full range calf raise to improve push-off during gait cycle.  Baseline: see above  07/19/23: ~2cm difference compared to L Goal status: ONGOING   LONG TERM GOALS: Target date: 08/25/23  Patient will score at least 64% on FOTO to signify clinically meaningful improvement in functional abilities.   Baseline: see above Goal status: INITIAL  2.  Patient will be able to lift at least 15 lbs with proper form without pain.  Baseline: increased pain with lifting.  Goal status: INITIAL  3.  Patient will self-report tolerating at least 1 hour of standing activity.  Baseline: 30 minutes  Goal status: INITIAL  4.  Patient will demonstrate at least 4+/5 bilateral hip abductor strength to improve stability about the chain with prolonged walking/standing activity.  Baseline: see above Goal status: INITIAL  5.  Patient will be independent with advanced home program to progress/maintain current level of function.  Baseline: see above  Goal status: INITIAL  6.  Patient will demonstrate 5/5 ankle strength to improve gait stability.  Baseline: see above  Goal status: INITIAL  PLAN:  PT FREQUENCY: 1-2x/week  PT  DURATION: 8 weeks  PLANNED INTERVENTIONS: 97164- PT Re-evaluation, 97110-Therapeutic exercises, 97530- Therapeutic activity, [id number]- Neuromuscular re-education, 97535- Self Care, [id number]- Manual therapy, [id number]- Gait training, [id number]- Aquatic Therapy, 97014- Electrical stimulation (unattended), [id number]- Electrical stimulation (manual), [id number]- Vasopneumatic device, [id number]- Ionotophoresis 4mg /ml Dexamethasone, Taping, Dry Needling, Cryotherapy, and Moist heat.  PLAN FOR NEXT SESSION: Continue with core/hip strengthening, emphasis on R QL and glute max/med.   Ashley Murrain PT, DPT 07/19/2023 12:46 PM

## 2023-07-19 ENCOUNTER — Ambulatory Visit: Payer: PPO | Admitting: Physical Therapy

## 2023-07-19 ENCOUNTER — Encounter: Payer: Self-pay | Admitting: Physical Therapy

## 2023-07-19 DIAGNOSIS — M5416 Radiculopathy, lumbar region: Secondary | ICD-10-CM | POA: Diagnosis not present

## 2023-07-19 DIAGNOSIS — M6281 Muscle weakness (generalized): Secondary | ICD-10-CM

## 2023-07-19 DIAGNOSIS — M25571 Pain in right ankle and joints of right foot: Secondary | ICD-10-CM

## 2023-07-20 NOTE — Progress Notes (Signed)
Yaire, folate looks phenomenal so it is definitely not a B12 or folate deficiency so we will keep an eye on it.  I also encourage you to consider getting your pneumonia vaccine updated you just get it once after 65.

## 2023-07-23 ENCOUNTER — Ambulatory Visit: Payer: PPO | Admitting: Physical Therapy

## 2023-07-23 LAB — SPECIMEN STATUS REPORT

## 2023-07-23 LAB — FOLATE: Folate: >20 ng/mL (ref >3.0)

## 2023-07-25 ENCOUNTER — Ambulatory Visit: Payer: PPO | Attending: Sports Medicine | Admitting: Physical Therapy

## 2023-07-25 ENCOUNTER — Encounter: Payer: Self-pay | Admitting: Physical Therapy

## 2023-07-25 DIAGNOSIS — M25571 Pain in right ankle and joints of right foot: Secondary | ICD-10-CM | POA: Diagnosis present

## 2023-07-25 DIAGNOSIS — M6281 Muscle weakness (generalized): Secondary | ICD-10-CM | POA: Diagnosis present

## 2023-07-25 DIAGNOSIS — M5416 Radiculopathy, lumbar region: Secondary | ICD-10-CM | POA: Diagnosis present

## 2023-07-25 DIAGNOSIS — R293 Abnormal posture: Secondary | ICD-10-CM | POA: Diagnosis present

## 2023-07-25 NOTE — Therapy (Signed)
 OUTPATIENT PHYSICAL THERAPY TREATMENT   Patient Name: Sheila Harris MRN: [id number] DOB:03/28/1956,67 y.o., female Today's Date: 07/25/2023   END OF SESSION:  PT End of Session - 07/25/23 1103     Visit Number 8    Number of Visits 17    Date for PT Re-Evaluation 08/25/23    Authorization Type [employer]    Progress Note Due on Visit 10    PT Start Time 1104    PT Stop Time 1146    PT Time Calculation (min) 42 min    Activity Tolerance Patient tolerated treatment well                     Past Medical History:  Diagnosis Date   Genital herpes    Macular degeneration    Thyroid  disease    History reviewed. No pertinent surgical history. Patient Active Problem List   Diagnosis Date Noted   Chronic pain of left wrist 05/07/2023   Anxiety disorder 01/11/2023   Contusion of knee, left 11/09/2022   Metatarsalgia of right foot 03/06/2022   Iron deficiency 02/23/2022   Dry age-related macular degeneration 12/08/2021   Chronic cough 08/11/2021   Chronic foot pain, right 07/12/2021   Left shoulder pain 05/05/2021   Hormone replacement therapy (HRT) 07/08/2020   Corn of foot right, 4/5 interspace 06/07/2020   Herpes infection 11/19/2019   Osteoporosis 11/19/2019   Factor V Leiden (HCC) 11/19/2019   Hypothyroidism 11/19/2019   Anxiety 11/19/2019   Trigger finger of right hand 06/07/2016   Carpal tunnel syndrome, right 02/22/2016   Primary osteoarthritis of right hand 12/30/2015   Degenerative disc disease, cervical 12/30/2015   Trigger finger of left hand 10/08/2015   Pes cavus 11/21/2013   Right lumbar radiculopathy 11/21/2013    PCP: Alvan Dorothyann BIRCH, MD  REFERRING PROVIDER: Curtis Debby PARAS, MD   REFERRING DIAG: M54.16 (ICD-10-CM) - Right lumbar radiculopathy; foot pain   Rationale for Evaluation and Treatment: Rehabilitation  THERAPY DIAG:  Radiculopathy, lumbar region  Pain in right ankle and joints of right foot  Muscle  weakness (generalized)  ONSET DATE: acute on chronic with exacerbation in September 2024  SUBJECTIVE:                                                                                                                                                                                          07/25/2023 has been using nerve glides in the morning with good relief. Also reports strengthening exercises have been helpful. No other new updates   Per eval - Patient reports her back pain has been an issue on/off for  40 years of insidious onset.  The pain can radiate from her back down the back of the RLE. The heel is also very painful and has to roll it in the morning to make it feel better. Was told that it might be plantar fascitis from one doctor and another said it was coming from her back. She thinks the heel pain and exacerbation of back pain might be coming from an injury to the 2nd digit from pushing off the wall in the pool about 3.5 months ago. She denies any numbness or tingling. No red flag symptoms. She had an MRI on her back about a year ago and the doctor told her overall the back looked pretty good.   PERTINENT HISTORY:  Osteoporosis   PAIN:  Are you having pain? Yes: NPRS scale: unrated  Pain location: Rt posterior hip radiating to the foot.  Pain description: aching Aggravating factors: prolonged standing > 30 minutes, lifting Relieving factors: rolling out, TENS, short duration of sitting  PRECAUTIONS: None  WEIGHT BEARING RESTRICTIONS: No  FALLS:  Has patient fallen in last 6 months? No  LIVING ENVIRONMENT: Lives with: lives with their family Lives in: House/apartment Stairs: Yes: Internal: flight steps; on right going up Has following equipment at home: None  OCCUPATION: works part-time, psychologist, occupational   PLOF: Independent  PATIENT GOALS: just to be able to get out of bed ok and stand a little longer.    OBJECTIVE:  Note: Objective measures were completed at  Evaluation unless otherwise noted.  DIAGNOSTIC FINDINGS:  None on file   PATIENT SURVEYS:  FOTO 57% function to 64% predicted   SCREENING FOR RED FLAGS: Bowel or bladder incontinence: No  Cauda equina syndrome: No   COGNITION: Overall cognitive status: Within functional limits for tasks assessed     SENSATION: Not tested  MUSCLE LENGTH: Hamstrings: mild tightness bilaterally   POSTURE: decreased lumbar lordosis Rt ribcage lower in standing  Lt pelvis retracted in standing  Pes cavus  PALPATION: TTP proximal plantar fascia/plantar aspect of calcaneous Rt  LUMBAR ROM:   AROM eval  Flexion 10% limited   Extension 25% limited   Right lateral flexion 50% limited  Left lateral flexion 50% limited   Right rotation 50% limited  Left rotation 50% limited    (Blank rows = not tested)  LOWER EXTREMITY ROM:     Active  Right eval Left eval Right 07/19/23  Hip flexion     Hip extension     Hip abduction     Hip adduction     Hip internal rotation     Hip external rotation     Knee flexion     Knee extension     Ankle dorsiflexion 7  8 deg painless  Ankle plantarflexion     Ankle inversion     Ankle eversion      (Blank rows = not tested)  LOWER EXTREMITY MMT:    MMT Right eval Left eval  Hip flexion 4+ 4+  Hip extension 4 4  Hip abduction 4- 4-  Hip adduction    Hip internal rotation    Hip external rotation    Knee flexion 5 5  Knee extension 5 5  Ankle dorsiflexion 5 5  Ankle plantarflexion Partial SL calf raise Partial SL calf raise   Ankle inversion 4 4+  Ankle eversion 4 4+   (Blank rows = not tested)  LUMBAR SPECIAL TESTS:  (-) SLR   FUNCTIONAL TESTS:  Squat: WNL  SLS: 30 seconds LLE; 10 seconds RLE Trendelenburg bilateral   GAIT: Distance walked: 20 ft  Assistive device utilized: None Level of assistance: Complete Independence Comments: limited push-off RLE  [facility] Adult PT Treatment:                                                 DATE: 07/25/23 Therapeutic Exercise: Doorway QL stretch 2x30sec (pt requests picture taken on her personal phone for reference with home program) Bent knee fallout AROM x10 supine Blue band paloff, kickstand x8 BIL green band kickstand paloff + OH x8 HEP update + education, inc time w/ education on rationale for interventions, relevant anatomy/physiology     [facility] Adult PT Treatment:                                                DATE: 07/19/23 Therapeutic Exercise: Doorway QL stretch R side 2x5 w/ breath control  Doorway rhomboid stretch x8  High>low row green band 2x12, blue band x10 Suitcase carry 5# KB 2x54ft BIL, 7# DB x60ft cues for posture  R sided sciatic nerve glide x15 HEP discussion/education  Therapeutic Activity: Checking STG, education/discussion re: progress w/ PT thus far, symptom behavior as it affects activities outside of sessions, relevant anatomy/physiology as it pertains to symptom behavior    [facility] Adult PT Treatment:                                                DATE: 07/17/23 Therapeutic Exercise: Sciatic nerve glides RLE x 10  Hip bridge with abduction blue band 2 x 10  SLR 2 x 10  Updated HEP    [facility] Adult PT Treatment:                                                DATE: 07/10/23 Therapeutic Exercise: Towel slides eversion/inversion x 5 d/c due to 5th digit pain Toe yoga x 10  Toe splay x 10  Seated resisted ankle DF 1 lb ankle weight 2 x 10  Sidelying resisted ankle eversion 1 lb ankle weight 2 x 10  Sidelying hip abduction 2 x 10  Updated HEP    [facility] Adult PT Treatment:                                                DATE: 07/06/23 Therapeutic Exercise: Deficit heel raises BIL UE support 2x12 cues for pacing and mechanics Bosu lunge 2x8 BIL w/ gentle UE support cues for mechanics and appropriate weight shift Green band pull down on airex x10 cues for posture and foot mechanics HEP discussion, significant time spent w/ education/discussion re:  rationale for interventions, benefit of strengthening tissues, relevant anatomy/physiology   Manual Therapy: Prone; STM to R gastroc/soleus w/ trigger point release    PATIENT EDUCATION:  Education details:  rationale for interventions, HEP  Person educated: Patient Education method: Explanation, Demonstration, Tactile cues, Verbal cues, handout  Education comprehension: verbalized understanding, returned demonstration, verbal cues required, tactile cues required, and needs further education      HOME EXERCISE PROGRAM: Access Code: [email or web address] URL: [email or web address] Date: 07/25/2023 Prepared by: Alm Jenny  Program Notes - with antirotation press, please do in staggered stance, with forward foot closest to wall, as done in clinic- with hip internal rotation, please perform at doorway with support as done in clinic  Exercises - Standing Anti-Rotation Press with Anchored Resistance  - 1 x daily - 7 x weekly - 2-3 sets - 8-10 reps - Supine Sciatic Nerve Glide  - 1 x daily - 7 x weekly - 1 sets - 10 reps - Standing Quadratus Lumborum Stretch with Doorway  - 1 x daily - 7 x weekly - 2 sets - 2-3 reps - 30sec hold - Standing Single Leg Hip Internal Rotation  - 1 x daily - 7 x weekly - 2 sets - 8 reps  ASSESSMENT:  CLINICAL IMPRESSION: 07/25/2023 Pt arrives w/ unrated pain on NPS, no issues after last session. Continuing to spend increased time w/ education/discussion re: rationale for intervention and relevant anatomy/physiology to promote increased self efficacy with management of home program. Continuing to progress exercises working on QL tissue extensibility, core strength, and active hip mobility. No adverse events, tolerates session well. Recommend continuing along current POC in order to address relevant deficits and improve functional tolerance. Pt departs today's session in no acute distress, all voiced questions/concerns addressed appropriately from PT perspective.      Per eval - Patient is a 68 y.o. female who was seen today for physical therapy evaluation and treatment for chronic low back/posterior RLE pain and isolated Rt heel pain. She reports her back and RLE pain is a chronic issue, but has recently worsened over the past 3.5 months after injuring her Rt 2nd digit when pushing off the wall when swimming. Upon assessment she is noted to have limited trunk AROM, postural abnormalities, and hip weakness. Specific to the heel she does have palpable tenderness about proximal plantar fascia/plantar aspect of calcaneous, limited DF AROM, bilateral ankle weakness, balance deficits, and gait abnormalities. Did not have time to issue thorough HEP today due to patient being late for evaluation. She will benefit from skilled PT to address the above stated deficits in order to optimize her function and assist in overall pain reduction.    OBJECTIVE IMPAIRMENTS: Abnormal gait, decreased activity tolerance, decreased balance, decreased endurance, decreased knowledge of condition, difficulty walking, decreased ROM, decreased strength, impaired flexibility, improper body mechanics, postural dysfunction, and pain.   ACTIVITY LIMITATIONS: carrying, lifting, standing, squatting, and locomotion level  PARTICIPATION LIMITATIONS: cleaning, laundry, shopping, and community activity  PERSONAL FACTORS: Age, Time since onset of injury/illness/exacerbation, and 1 comorbidity: osteoporosis  are also affecting patient's functional outcome.   REHAB POTENTIAL: Good  CLINICAL DECISION MAKING: Evolving/moderate complexity  EVALUATION COMPLEXITY: Moderate   GOALS: Goals reviewed with patient? Yes  SHORT TERM GOALS: Target date: 07/24/2023  Patient will be independent and compliant with initial HEP.   Baseline: see above 07/19/23: reports ongoing HEP performance  Goal status: ONGOING  2.  Patient will demonstrate at least 10 degrees of Rt ankle DF AROM to improve gait mechanics.   Baseline: see above  07/19/23: 8 deg DF R ankle  Goal status: NEARLY MET   3.  Patient will maintain Rt SLS for at least 20  seconds with no signs of pelvic drop to improve overall stability.  Baseline: see above  07/18/22: pt requests to defer today Goal status: ONGOING  4.  Patient will complete full range calf raise to improve push-off during gait cycle.  Baseline: see above  07/19/23: ~2cm difference compared to L Goal status: ONGOING   LONG TERM GOALS: Target date: 08/25/23  Patient will score at least 64% on FOTO to signify clinically meaningful improvement in functional abilities.   Baseline: see above Goal status: INITIAL  2.  Patient will be able to lift at least 15 lbs with proper form without pain.  Baseline: increased pain with lifting.  Goal status: INITIAL  3.  Patient will self-report tolerating at least 1 hour of standing activity.  Baseline: 30 minutes  Goal status: INITIAL  4.  Patient will demonstrate at least 4+/5 bilateral hip abductor strength to improve stability about the chain with prolonged walking/standing activity.  Baseline: see above Goal status: INITIAL  5.  Patient will be independent with advanced home program to progress/maintain current level of function.  Baseline: see above  Goal status: INITIAL  6.  Patient will demonstrate 5/5 ankle strength to improve gait stability.  Baseline: see above  Goal status: INITIAL  PLAN:  PT FREQUENCY: 1-2x/week  PT DURATION: 8 weeks  PLANNED INTERVENTIONS: 97164- PT Re-evaluation, 97110-Therapeutic exercises, 97530- Therapeutic activity, [id number]- Neuromuscular re-education, 97535- Self Care, [id number]- Manual therapy, [id number]- Gait training, [id number]- Aquatic Therapy, 97014- Electrical stimulation (unattended), [id number]- Electrical stimulation (manual), [id number]- Vasopneumatic device, [id number]- Ionotophoresis 4mg /ml Dexamethasone, Taping, Dry Needling, Cryotherapy, and Moist heat.  PLAN FOR NEXT SESSION: Continue with  core/hip strengthening, emphasis on R QL and glute max/med.   Alm DELENA Jenny PT, DPT 07/25/2023 12:56 PM

## 2023-07-26 ENCOUNTER — Other Ambulatory Visit: Payer: Self-pay | Admitting: Family Medicine

## 2023-07-26 DIAGNOSIS — M9901 Segmental and somatic dysfunction of cervical region: Secondary | ICD-10-CM | POA: Diagnosis not present

## 2023-07-26 DIAGNOSIS — M9905 Segmental and somatic dysfunction of pelvic region: Secondary | ICD-10-CM | POA: Diagnosis not present

## 2023-07-26 DIAGNOSIS — M9903 Segmental and somatic dysfunction of lumbar region: Secondary | ICD-10-CM | POA: Diagnosis not present

## 2023-07-26 DIAGNOSIS — M9902 Segmental and somatic dysfunction of thoracic region: Secondary | ICD-10-CM | POA: Diagnosis not present

## 2023-07-27 ENCOUNTER — Encounter (INDEPENDENT_AMBULATORY_CARE_PROVIDER_SITE_OTHER): Payer: Self-pay | Admitting: Sports Medicine

## 2023-07-27 DIAGNOSIS — M5416 Radiculopathy, lumbar region: Secondary | ICD-10-CM

## 2023-07-29 ENCOUNTER — Encounter: Payer: Self-pay | Admitting: Family Medicine

## 2023-07-29 NOTE — Telephone Encounter (Signed)

## 2023-07-30 ENCOUNTER — Encounter: Payer: Self-pay | Admitting: Family Medicine

## 2023-07-30 ENCOUNTER — Telehealth: Payer: PPO | Admitting: Physician Assistant

## 2023-07-30 ENCOUNTER — Telehealth (INDEPENDENT_AMBULATORY_CARE_PROVIDER_SITE_OTHER): Payer: PPO | Admitting: Family Medicine

## 2023-07-30 DIAGNOSIS — U071 COVID-19: Secondary | ICD-10-CM

## 2023-07-30 MED ORDER — NIRMATRELVIR/RITONAVIR (PAXLOVID)TABLET: 3.0000 | ORAL_TABLET | Freq: Two times a day (BID) | ORAL | 0 refills | Status: AC

## 2023-07-30 NOTE — Progress Notes (Signed)
    Virtual Visit via Video Note  I connected with Sheila Harris on 07/30/23 at  1:00 PM EST by a video enabled telemedicine application and verified that I am speaking with the correct person using two identifiers.   I discussed the limitations of evaluation and management by telemedicine and the availability of in person appointments. The patient expressed understanding and agreed to proceed.  Patient location: at home Provider location: in office  Subjective:    CC:   Chief Complaint  Patient presents with   Covid Positive    HPI:  Sat night and then tested pos yesterday AM.  Fever 99.5 no ST, + cough, no major congestion.  No SOB. Loose stools. Husband tested pos on Friday   Past medical history, Surgical history, Family history not pertinant except as noted below, Social history, Allergies, and medications have been entered into the medical record, reviewed, and corrections made.    Objective:    General: Speaking clearly in complete sentences without any shortness of breath.  Alert and oriented x3.  Normal judgment. No apparent acute distress.    Impression and Recommendations:    Problem List Items Addressed This Visit   None Visit Diagnoses       COVID-19    -  Primary   Relevant Medications   nirmatrelvir /ritonavir  (PAXLOVID ) 20 x 150 MG & 10 x 100MG  TABS      Discussed medication as option. She would like to proceed. Call if not better in one week. Ok to continue symptomatic care..   No orders of the defined types were placed in this encounter.   Meds ordered this encounter  Medications   nirmatrelvir /ritonavir  (PAXLOVID ) 20 x 150 MG & 10 x 100MG  TABS    Sig: Take 3 tablets by mouth 2 (two) times daily for 5 days. (Take nirmatrelvir  150 mg two tablets twice daily for 5 days and ritonavir  100 mg one tablet twice daily for 5 days) Patient GFR is 96    Dispense:  30 tablet    Refill:  0     I discussed the assessment and treatment plan with the  patient. The patient was provided an opportunity to ask questions and all were answered. The patient agreed with the plan and demonstrated an understanding of the instructions.   The patient was advised to call back or seek an in-person evaluation if the symptoms worsen or if the condition fails to improve as anticipated.   Duaine German, MD

## 2023-07-30 NOTE — Progress Notes (Signed)
 Pt tested positive yesterday for COVID her sxs cough,chills, fever 99.5 (yesterday), congestion

## 2023-07-30 NOTE — Telephone Encounter (Signed)
 Patient scheduled.

## 2023-07-31 ENCOUNTER — Ambulatory Visit: Payer: PPO | Admitting: Physical Therapy

## 2023-08-02 ENCOUNTER — Encounter: Payer: PPO | Admitting: Physical Therapy

## 2023-08-02 ENCOUNTER — Ambulatory Visit: Payer: PPO | Admitting: Sports Medicine

## 2023-08-03 DIAGNOSIS — M7740 Metatarsalgia, unspecified foot: Secondary | ICD-10-CM | POA: Diagnosis not present

## 2023-08-03 DIAGNOSIS — M9901 Segmental and somatic dysfunction of cervical region: Secondary | ICD-10-CM | POA: Diagnosis not present

## 2023-08-03 DIAGNOSIS — U099 Post covid-19 condition, unspecified: Secondary | ICD-10-CM | POA: Diagnosis not present

## 2023-08-03 DIAGNOSIS — M9908 Segmental and somatic dysfunction of rib cage: Secondary | ICD-10-CM | POA: Diagnosis not present

## 2023-08-03 DIAGNOSIS — M9902 Segmental and somatic dysfunction of thoracic region: Secondary | ICD-10-CM | POA: Diagnosis not present

## 2023-08-03 DIAGNOSIS — M79671 Pain in right foot: Secondary | ICD-10-CM | POA: Diagnosis not present

## 2023-08-03 DIAGNOSIS — M9907 Segmental and somatic dysfunction of upper extremity: Secondary | ICD-10-CM | POA: Diagnosis not present

## 2023-08-03 DIAGNOSIS — M9906 Segmental and somatic dysfunction of lower extremity: Secondary | ICD-10-CM | POA: Diagnosis not present

## 2023-08-03 DIAGNOSIS — M722 Plantar fascial fibromatosis: Secondary | ICD-10-CM | POA: Diagnosis not present

## 2023-08-06 ENCOUNTER — Encounter: Payer: Self-pay | Admitting: Family Medicine

## 2023-08-06 DIAGNOSIS — M818 Other osteoporosis without current pathological fracture: Secondary | ICD-10-CM

## 2023-08-08 ENCOUNTER — Ambulatory Visit: Payer: PPO | Admitting: Physical Therapy

## 2023-08-13 ENCOUNTER — Encounter: Payer: Self-pay | Admitting: Rehabilitative and Restorative Service Providers"

## 2023-08-13 ENCOUNTER — Ambulatory Visit: Payer: PPO | Admitting: Rehabilitative and Restorative Service Providers"

## 2023-08-13 DIAGNOSIS — R293 Abnormal posture: Secondary | ICD-10-CM

## 2023-08-13 DIAGNOSIS — M5416 Radiculopathy, lumbar region: Secondary | ICD-10-CM | POA: Diagnosis not present

## 2023-08-13 DIAGNOSIS — M25571 Pain in right ankle and joints of right foot: Secondary | ICD-10-CM

## 2023-08-13 DIAGNOSIS — M6281 Muscle weakness (generalized): Secondary | ICD-10-CM

## 2023-08-13 NOTE — Therapy (Signed)
 OUTPATIENT PHYSICAL THERAPY TREATMENT   Patient Name: Sheila Harris MRN: [id number] DOB:[birth year],68 y.o., female Today's Date: 08/13/2023   END OF SESSION:  PT End of Session - 08/13/23 1020     Visit Number 9    Number of Visits 17    Date for PT Re-Evaluation 08/25/23    Authorization Type [employer]    Progress Note Due on Visit 10    PT Start Time 1020    PT Stop Time 1105    PT Time Calculation (min) 45 min    Activity Tolerance Patient tolerated treatment well    Behavior During Therapy WFL for tasks assessed/performed             Past Medical History:  Diagnosis Date   Genital herpes    Macular degeneration    Thyroid disease    History reviewed. No pertinent surgical history. Patient Active Problem List   Diagnosis Date Noted   Chronic pain of left wrist 05/07/2023   Anxiety disorder 01/11/2023   Contusion of knee, left 11/09/2022   Metatarsalgia of right foot 03/06/2022   Iron deficiency 02/23/2022   Dry age-related macular degeneration 12/08/2021   Chronic cough 08/11/2021   Chronic foot pain, right 07/12/2021   Left shoulder pain 05/05/2021   Hormone replacement therapy (HRT) 07/08/2020   Corn of foot right, 4/5 interspace 06/07/2020   Herpes infection 11/19/2019   Osteoporosis 11/19/2019   Factor V Leiden (HCC) 11/19/2019   Hypothyroidism 11/19/2019   Anxiety 11/19/2019   Trigger finger of right hand 06/07/2016   Carpal tunnel syndrome, right 02/22/2016   Primary osteoarthritis of right hand 12/30/2015   Degenerative disc disease, cervical 12/30/2015   Trigger finger of left hand 10/08/2015   Pes cavus 11/21/2013   Right lumbar radiculopathy 11/21/2013    PCP: Agapito Games, MD REFERRING PROVIDER: Monica Becton, MD  REFERRING DIAG: M54.16 (ICD-10-CM) - Right lumbar radiculopathy; foot pain  Rationale for Evaluation and Treatment: Rehabilitation  THERAPY DIAG:  Radiculopathy, lumbar region  Pain in right  ankle and joints of right foot  Muscle weakness (generalized)  Abnormal posture  ONSET DATE: acute on chronic with exacerbation in September 2024  SUBJECTIVE:                                                                                                                                                                                          The patient has not been to PT in >2 weeks. She had Covid 2 weeks ago and then didn't come in last week due to the weather. She reports she does neck exercises from last year, and  works out her heel with stretching in the morning. Pain is worse with sleeping on the right side and goes from R shoulder all the way down to hips. She unloaded water yesterday and felt like everything tightened up into her shoulder, trunk, and R thigh.   Per eval - Patient reports her back pain has been an issue on/off for 40 years of insidious onset.  The pain can radiate from her back down the back of the RLE. The heel is also very painful and has to roll it in the morning to make it feel better. Was told that it might be plantar fascitis from one doctor and another said it was coming from her back. She thinks the heel pain and exacerbation of back pain might be coming from an injury to the 2nd digit from pushing off the wall in the pool about 3.5 months ago. She denies any numbness or tingling. No red flag symptoms. She had an MRI on her back about a year ago and the doctor told her "overall the back looked pretty good."  PERTINENT HISTORY:  Osteoporosis   PAIN:  Are you having pain? Yes: NPRS scale: 5/10 Pain location: Rt posterior hip radiating to the foot. She also reports pain in R lat and into scapular region today Pain description: aching Aggravating factors: prolonged standing > 30 minutes, lifting Relieving factors: rolling out, TENS, short duration of sitting  PRECAUTIONS: None  WEIGHT BEARING RESTRICTIONS: No  FALLS:  Has patient fallen in last 6 months?  No  LIVING ENVIRONMENT: Lives with: lives with their family Lives in: House/apartment Stairs: Yes: Internal: flight steps; on right going up Has following equipment at home: None  OCCUPATION: works part-time, Psychologist, occupational   PLOF: Independent  PATIENT GOALS: "just to be able to get out of bed ok and stand a little longer."   OBJECTIVE:  Note: Objective measures were completed at Evaluation unless otherwise noted.  PATIENT SURVEYS:  FOTO 57% function to 64% predicted   SCREENING FOR RED FLAGS: Bowel or bladder incontinence: No Cauda equina syndrome: No  COGNITION: Overall cognitive status: Within functional limits for tasks assessed     MUSCLE LENGTH: Hamstrings: mild tightness bilaterally   POSTURE: decreased lumbar lordosis Rt ribcage lower in standing  Lt pelvis retracted in standing  Pes cavus  PALPATION: TTP proximal plantar fascia/plantar aspect of calcaneous Rt  LUMBAR ROM:  AROM eval  Flexion 10% limited   Extension 25% limited   Right lateral flexion 50% limited  Left lateral flexion 50% limited   Right rotation 50% limited  Left rotation 50% limited    (Blank rows = not tested)  LOWER EXTREMITY ROM:    Active  Right eval Left eval Right 07/19/23  Hip flexion     Hip extension     Hip abduction     Hip adduction     Hip internal rotation     Hip external rotation     Knee flexion     Knee extension     Ankle dorsiflexion 7  8 deg painless  Ankle plantarflexion     Ankle inversion     Ankle eversion      (Blank rows = not tested)  LOWER EXTREMITY MMT:   MMT Right eval Left eval  Hip flexion 4+ 4+  Hip extension 4 4  Hip abduction 4- 4-  Hip adduction    Hip internal rotation    Hip external rotation    Knee flexion 5 5  Knee extension 5 5  Ankle dorsiflexion 5 5  Ankle plantarflexion Partial SL calf raise Partial SL calf raise   Ankle inversion 4 4+  Ankle eversion 4 4+   (Blank rows = not tested)  LUMBAR SPECIAL TESTS:  (-)  SLR   FUNCTIONAL TESTS:  Squat: WNL SLS: 30 seconds LLE; 10 seconds RLE Trendelenburg bilateral   GAIT: Distance walked: 20 ft  Assistive device utilized: None Level of assistance: Complete Independence Comments: limited push-off RLE   [facility] Adult PT Treatment:                                                DATE: 08/13/23 Therapeutic Exercise: Standing QL stretch in door frame with tactile and demo cues R and L sides Modified down dog position with hands holding mat Reviewed hip IR HEP Single leg stance x 6 seconds R and 20 seconds L near support for intermittent UE holds Wall slides, mini squats x 5 reps each Seated Internal hip rotation R and L sides Supine Sciatic nerve glide Hip flexor stretch Sidelying Plank x 5 seconds x 5 reps R and L sides with demo (began modified with knees bent and moved to extended legs) Manual Therapy: Self mobilization with foam roller and demonstrated rotation to rollout R hip STM R QL and R glut med, R latissimus and erector spinae musculature Trigger Point Dry Needling Subsequent Treatment: Instructions reviewed, if requested by the patient, prior to subsequent dry needling treatment.  Patient Verbal Consent Given: Yes Education Handout Provided: Previously Provided Muscles Treated: R glut med, R latissimus---used threading parallel to muscle and reviewed pneumothorax precautions Treatment Response/Outcome: reduced pain  [facility] Adult PT Treatment:                                                DATE: 07/25/23 Therapeutic Exercise: Doorway QL stretch 2x30sec (pt requests picture taken on her personal phone for reference with home program) Bent knee fallout AROM x10 supine Blue band paloff, kickstand x8 BIL green band kickstand paloff + OH x8 HEP update + education, inc time w/ education on rationale for interventions, relevant anatomy/physiology    [facility] Adult PT Treatment:                                                DATE:  07/19/23 Therapeutic Exercise: Doorway QL stretch R side 2x5 w/ breath control  Doorway rhomboid stretch x8  High>low row green band 2x12, blue band x10 Suitcase carry 5# KB 2x52ft BIL, 7# DB x77ft cues for posture  R sided sciatic nerve glide x15 HEP discussion/education  Therapeutic Activity: Checking STG, education/discussion re: progress w/ PT thus far, symptom behavior as it affects activities outside of sessions, relevant anatomy/physiology as it pertains to symptom behavior   PATIENT EDUCATION:  Education details: rationale for interventions, HEP  Person educated: Patient Education method: Explanation, Demonstration, Tactile cues, Verbal cues, handout  Education comprehension: verbalized understanding, returned demonstration, verbal cues required, tactile cues required, and needs further education      HOME EXERCISE PROGRAM: Access Code: [email or web address] URL: [email or web address] Date:  07/25/2023 Prepared by: Fransisco Hertz  Program Notes - with antirotation press, please do in staggered stance, with forward foot closest to wall, as done in clinic- with hip internal rotation, please perform at doorway with support as done in clinic  Exercises - Standing Anti-Rotation Press with Anchored Resistance  - 1 x daily - 7 x weekly - 2-3 sets - 8-10 reps - Supine Sciatic Nerve Glide  - 1 x daily - 7 x weekly - 1 sets - 10 reps - Standing Quadratus Lumborum Stretch with Doorway  - 1 x daily - 7 x weekly - 2 sets - 2-3 reps - 30sec hold - Standing Single Leg Hip Internal Rotation  - 1 x daily - 7 x weekly - 2 sets - 8 reps  ASSESSMENT:  CLINICAL IMPRESSION: The patient reports 5/10 pain upon arriving, however throughout session does not note pain. She requested STM/DN for R low back/trunk due to pain since unloading water yesterday--- "a sensation of tightening". Patient also notes pain after sleeping on the R side. PT continuing to focus on need for progressive strengthening to  tolerance in order to continue to handle load during functional tasks. We discussed STM/DN benefits are short term and exercise benefits long term. Patient tolerated activities well-- she did feel side plank made her pec tight and she demonstrated prior door frame stretch for stretching.  Per eval - Patient is a 68 y.o. female who was seen today for physical therapy evaluation and treatment for chronic low back/posterior RLE pain and isolated Rt heel pain. She reports her back and RLE pain is a chronic issue, but has recently worsened over the past 3.5 months after injuring her Rt 2nd digit when pushing off the wall when swimming. Upon assessment she is noted to have limited trunk AROM, postural abnormalities, and hip weakness. Specific to the heel she does have palpable tenderness about proximal plantar fascia/plantar aspect of calcaneous, limited DF AROM, bilateral ankle weakness, balance deficits, and gait abnormalities. Did not have time to issue thorough HEP today due to patient being late for evaluation. She will benefit from skilled PT to address the above stated deficits in order to optimize her function and assist in overall pain reduction.    GOALS: Goals reviewed with patient? Yes  SHORT TERM GOALS: Target date: 07/24/2023  Patient will be independent and compliant with initial HEP.  Baseline: see above 07/19/23: reports ongoing HEP performance  Goal status: MET-- although patient needs cues for correct performance   2.  Patient will demonstrate at least 10 degrees of Rt ankle DF AROM to improve gait mechanics.  Baseline: see above  07/19/23: 8 deg DF R ankle  Goal status: NEARLY MET   3.  Patient will maintain Rt SLS for at least 20 seconds with no signs of pelvic drop to improve overall stability.  Baseline: see above  07/18/22: pt requests to defer today Goal status: NOT MET 08/13/23  4.  Patient will complete full range calf raise to improve push-off during gait cycle.  Baseline:  see above  07/19/23: ~2cm difference compared to L Goal status: ONGOING   LONG TERM GOALS: Target date: 08/25/23  Patient will score at least 64% on FOTO to signify clinically meaningful improvement in functional abilities.  Baseline: see above Goal status: INITIAL  2.  Patient will be able to lift at least 15 lbs with proper form without pain.  Baseline: increased pain with lifting.  Goal status: INITIAL  3.  Patient will self-report  tolerating at least 1 hour of standing activity.  Baseline: 30 minutes  Goal status: INITIAL  4.  Patient will demonstrate at least 4+/5 bilateral hip abductor strength to improve stability about the chain with prolonged walking/standing activity.  Baseline: see above Goal status: INITIAL  5.  Patient will be independent with advanced home program to progress/maintain current level of function.  Baseline: see above  Goal status: INITIAL  6.  Patient will demonstrate 5/5 ankle strength to improve gait stability.  Baseline: see above  Goal status: INITIAL  PLAN:  PT FREQUENCY: 1-2x/week  PT DURATION: 8 weeks  PLANNED INTERVENTIONS: 97164- PT Re-evaluation, 97110-Therapeutic exercises, 97530- Therapeutic activity, [id number]- Neuromuscular re-education, 97535- Self Care, [id number]- Manual therapy, [id number]- Gait training, [id number]- Aquatic Therapy, 97014- Electrical stimulation (unattended), [id number]- Electrical stimulation (manual), [id number]- Vasopneumatic device, [id number]- Ionotophoresis 4mg /ml Dexamethasone, Taping, Dry Needling, Cryotherapy, and Moist heat.  PLAN FOR NEXT SESSION: Continue with core/hip strengthening, emphasis on R QL and glute max/med.  Patient has HEP from therapy last year (neck) that she does, this HEP, and other home exercises. May want to discuss routine where she alternates versus focusing on each of these every day.  Fahd Galea, PT 08/13/2023 12:09 PM

## 2023-08-14 ENCOUNTER — Ambulatory Visit (INDEPENDENT_AMBULATORY_CARE_PROVIDER_SITE_OTHER): Payer: PPO

## 2023-08-14 VITALS — Ht 66.0 in | Wt 130.0 lb

## 2023-08-14 DIAGNOSIS — Z0000 Encounter for general adult medical examination without abnormal findings: Secondary | ICD-10-CM | POA: Diagnosis not present

## 2023-08-14 NOTE — Progress Notes (Signed)
 Subjective:   JEZLYN WESTERFIELD is a 68 y.o. female who presents for Medicare Annual (Subsequent) preventive examination.  Visit Complete: Virtual I connected with  Ayame A Brinley on 08/14/23 by a audio enabled telemedicine application and verified that I am speaking with the correct person using two identifiers.  Patient Location: Home  Provider Location: Office/Clinic  I discussed the limitations of evaluation and management by telemedicine. The patient expressed understanding and agreed to proceed.  Vital Signs: Because this visit was a virtual/telehealth visit, some criteria may be missing or patient reported. Any vitals not documented were not able to be obtained and vitals that have been documented are patient reported.  Patient Medicare AWV questionnaire was completed by the patient on 07/16/2023; I have confirmed that all information answered by patient is correct and no changes since this date.  Cardiac Risk Factors include: advanced age (>23men, >26 women)     Objective:    Today's Vitals   08/14/23 1302 08/14/23 1303  Weight: 130 lb (59 kg)   Height: 5\' 6"  (1.676 m)   PainSc:  7    Body mass index is 20.98 kg/m.     08/14/2023    1:13 PM 06/26/2023   10:28 AM 12/27/2022   10:22 AM 08/01/2022    1:16 PM 12/26/2021   10:29 AM 01/04/2016   12:02 PM 04/22/2015    9:22 AM  Advanced Directives  Does Patient Have a Medical Advance Directive? Yes Yes Yes No Yes No No  Type of Sales promotion account executive of [employer] Power of [employer] Power of Attorney    Does patient want to make changes to medical advance directive? No - Patient declined    No - Patient declined    Copy of Healthcare Power of Attorney in Chart? Yes - validated most recent copy scanned in chart (See row information)  No - copy requested  No - copy requested    Would patient like information on creating a medical advance directive?    No - Patient declined   Yes - Educational materials given No - patient declined information    Current Medications (verified) Outpatient Encounter Medications as of 08/14/2023  Medication Sig   acyclovir ointment (ZOVIRAX) 5 % Apply 1 Application topically every 3 (three) hours.   AMBULATORY NON FORMULARY MEDICATION Medication Name: Estradiol 2mg /Gm Cream. Apply 2 clicks twice daily to inner thighs.   ascorbic acid (VITAMIN C) 100 MG tablet Take by mouth.   b complex vitamins tablet Take by mouth.   CALCIUM PO Take 1,500 mg by mouth daily.   clonazePAM (KLONOPIN) 0.5 MG tablet TAKE 1/2 TABLET BY MOUTH EVERY NIGHT AT BEDTIME AS NEEDED FOR ANXIETY   Collagen Hydrolysate POWD by Does not apply route.   IBU 600 MG tablet TAKE 1 TABLET BY MOUTH EVERY 8 HOURS AS NEEDED   levothyroxine (SYNTHROID) 75 MCG tablet TAKE 1 TABLET BY MOUTH EVERY MORNING BEFORE BREAKFAST EXCEPT TAKE 1/2 TABLET BY MOUTH ON SUNDAY   liothyronine (CYTOMEL) 25 MCG tablet TAKE 1/2 TABLET BY MOUTH DAILY   MAGNESIUM PO Take 1,000 mg by mouth daily.   Multiple Vitamin (THERA) TABS Take by mouth.   progesterone (PROMETRIUM) 100 MG capsule TAKE 1 CAPSULE BY MOUTH AT BEDTIME   valACYclovir (VALTREX) 500 MG tablet TAKE 1 TABLET BY MOUTH 2 TIMES A DAY   VITAMIN D PO Take 2,500 mg by mouth daily.   gabapentin (NEURONTIN) 100 MG capsule Take  1 capsule (100 mg total) by mouth 3 (three) times daily as needed. (Patient not taking: Reported on 08/14/2023)   rizatriptan (MAXALT) 5 MG tablet Take 1 tablet (5 mg total) by mouth as needed for migraine. May repeat in 2 hours if needed (Patient not taking: Reported on 08/14/2023)   No facility-administered encounter medications on file as of 08/14/2023.    Allergies (verified) Patient has no known allergies.   History: Past Medical History:  Diagnosis Date   Genital herpes    Macular degeneration    Thyroid disease    History reviewed. No pertinent surgical history. Family History  Problem Relation Age of  Onset   Heart disease Mother    Hypertension Mother    Stroke Mother    Breast cancer Sister    Healthy Sister    Social History   Socioeconomic History   Marital status: Married    Spouse name: Marquerite Forsman Sr.   Number of children: 1   Years of education: 12   Highest education level: 12th grade  Occupational History   Occupation: Retired  Tobacco Use   Smoking status: Never   Smokeless tobacco: Never  Substance and Sexual Activity   Alcohol use: Yes    Alcohol/week: 5.0 standard drinks of alcohol    Types: 5 Standard drinks or equivalent per week    Comment: wine 1 glass 5 days a week   Drug use: Never   Sexual activity: Not Currently    Partners: Male  Other Topics Concern   Not on file  Social History Narrative   Lives with her husband and her son. She enjoys staying active.   Social Drivers of Corporate investment banker Strain: Low Risk  (08/14/2023)   Overall Financial Resource Strain (CARDIA)    Difficulty of Paying Living Expenses: Not hard at all  Recent Concern: Financial Resource Strain - Medium Risk (07/16/2023)   Overall Financial Resource Strain (CARDIA)    Difficulty of Paying Living Expenses: Somewhat hard  Food Insecurity: No Food Insecurity (08/14/2023)   Hunger Vital Sign    Worried About Running Out of Food in the Last Year: Never true    Ran Out of Food in the Last Year: Never true  Transportation Needs: No Transportation Needs (08/14/2023)   PRAPARE - Administrator, Civil Service (Medical): No    Lack of Transportation (Non-Medical): No  Physical Activity: Sufficiently Active (08/14/2023)   Exercise Vital Sign    Days of Exercise per Week: 5 days    Minutes of Exercise per Session: 40 min  Stress: No Stress Concern Present (08/14/2023)   [employer] of Occupational Health - Occupational Stress Questionnaire    Feeling of Stress : Not at all  Social Connections: Moderately Integrated (08/14/2023)   Social Connection and  Isolation Panel [NHANES]    Frequency of Communication with Friends and Family: More than three times a week    Frequency of Social Gatherings with Friends and Family: Once a week    Attends Religious Services: Never    Database administrator or Organizations: Yes    Attends Engineer, structural: More than 4 times per year    Marital Status: Married    Tobacco Counseling Counseling given: Not Answered   Clinical Intake:  Pre-visit preparation completed: Yes  Pain : 0-10 Pain Score: 7  Pain Type: Chronic pain Pain Location: Shoulder Pain Orientation: Right Pain Radiating Towards: hip Pain Descriptors / Indicators: Aching Pain Onset:  More than a month ago Pain Frequency: Constant Pain Relieving Factors: nothing Effect of Pain on Daily Activities: Unable to pick up heavy objects.  Pain Relieving Factors: nothing  BMI - recorded: 2098 Nutritional Status: BMI of 19-24  Normal Nutritional Risks: None Diabetes: No  How often do you need to have someone help you when you read instructions, pamphlets, or other written materials from your doctor or pharmacy?: 1 - Never What is the last grade level you completed in school?: 12  Interpreter Needed?: No      Activities of Daily Living    08/14/2023    1:05 PM  In your present state of health, do you have any difficulty performing the following activities:  Hearing? 0  Vision? 0  Difficulty concentrating or making decisions? 0  Walking or climbing stairs? 0  Dressing or bathing? 0  Doing errands, shopping? 0  Preparing Food and eating ? N  Using the Toilet? N  In the past six months, have you accidently leaked urine? N  Do you have problems with loss of bowel control? N  Managing your Medications? N  Managing your Finances? N  Housekeeping or managing your Housekeeping? N    Patient Care Team: Agapito Games, MD as PCP - General (Family Medicine) Hecox, Katlin (Psychology) Sharrell Ku, MD as  Consulting Physician (Gastroenterology) Carmela Rima, MD as Consulting Physician (Ophthalmology)  Indicate any recent Medical Services you may have received from other than [facility] providers in the past year (date may be approximate).     Assessment:   This is a routine wellness examination for [place].  Hearing/Vision screen No results found.   Goals Addressed             This Visit's Progress    Eat Healthy       She would like to continue a healthy lifestyle.       Depression Screen    08/14/2023    1:12 PM 01/11/2023    3:03 PM 07/13/2022    9:07 AM 07/13/2022    8:33 AM 02/22/2022    9:40 AM 12/26/2021   10:29 AM 11/07/2021    2:10 PM  PHQ 2/9 Scores  PHQ - 2 Score 0 2 0 0 0 0 0    Fall Risk    08/14/2023    1:13 PM 01/11/2023    3:03 PM 07/13/2022    8:33 AM 02/22/2022    9:39 AM 12/26/2021   10:29 AM  Fall Risk   Falls in the past year? 0 0 0 0 0  Number falls in past yr: 0 0 0 0 0  Injury with Fall? 0 0 0 0 0  Risk for fall due to : No Fall Risks No Fall Risks No Fall Risks No Fall Risks No Fall Risks  Follow up Falls evaluation completed Falls evaluation completed Falls evaluation completed Falls evaluation completed Falls evaluation completed    MEDICARE RISK AT HOME: Medicare Risk at Home Any stairs in or around the home?: Yes If so, are there any without handrails?: Yes Home free of loose throw rugs in walkways, pet beds, electrical cords, etc?: Yes Adequate lighting in your home to reduce risk of falls?: Yes Life alert?: No Use of a cane, walker or w/c?: No Grab bars in the bathroom?: No Shower chair or bench in shower?: No Elevated toilet seat or a handicapped toilet?: No  TIMED UP AND GO:  Was the test performed?  No    Cognitive  Function:        08/14/2023    1:14 PM 12/26/2021   10:35 AM  6CIT Screen  What Year? 0 points 0 points  What month? 0 points 0 points  What time? 0 points 0 points  Count back from 20 0 points 0 points  Months  in reverse 0 points 0 points  Repeat phrase 0 points 0 points  Total Score 0 points 0 points    Immunizations Immunization History  Administered Date(s) Administered   Influenza Split 03/25/2017, 03/15/2018, 02/12/2019, 04/06/2020   Influenza, High Dose Seasonal PF 03/02/2021   Influenza,inj,Quad PF,6+ Mos 03/15/2018   PFIZER(Purple Top)SARS-COV-2 Vaccination 08/22/2019, 09/15/2019, 05/04/2020   Pfizer Covid-19 Vaccine Bivalent Booster 83yrs & up 03/04/2021   Td 06/19/2002   Tdap 04/19/2012   Zoster, Live 01/14/2015    TDAP status: Due, Education has been provided regarding the importance of this vaccine. Advised may receive this vaccine at local pharmacy or Health Dept. Aware to provide a copy of the vaccination record if obtained from local pharmacy or Health Dept. Verbalized acceptance and understanding.  Flu Vaccine status: Up to date  Pneumococcal vaccine status: Due, Education has been provided regarding the importance of this vaccine. Advised may receive this vaccine at local pharmacy or Health Dept. Aware to provide a copy of the vaccination record if obtained from local pharmacy or Health Dept. Verbalized acceptance and understanding.  Covid-19 vaccine status: Completed vaccines  Qualifies for Shingles Vaccine? Yes   Zostavax completed No   Shingrix Completed?: No.    Education has been provided regarding the importance of this vaccine. Patient has been advised to call insurance company to determine out of pocket expense if they have not yet received this vaccine. Advised may also receive vaccine at local pharmacy or Health Dept. Verbalized acceptance and understanding.  Screening Tests Health Maintenance  Topic Date Due   Zoster Vaccines- Shingrix (1 of 2) 12/09/2004   Pneumonia Vaccine 57+ Years old (1 of 1 - PCV) Never done   COVID-19 Vaccine (5 - 2024-25 season) 02/18/2023   INFLUENZA VACCINE  09/17/2023 (Originally 01/18/2023)   Medicare Annual Wellness (AWV)   08/13/2024   Colonoscopy  11/23/2024   MAMMOGRAM  03/21/2025   DEXA SCAN  Completed   Hepatitis C Screening  Completed   HPV VACCINES  Aged Out   DTaP/Tdap/Td  Discontinued    Health Maintenance  Health Maintenance Due  Topic Date Due   Zoster Vaccines- Shingrix (1 of 2) 12/09/2004   Pneumonia Vaccine 57+ Years old (1 of 1 - PCV) Never done   COVID-19 Vaccine (5 - 2024-25 season) 02/18/2023    Colorectal cancer screening: Type of screening: Colonoscopy. Completed 11/24/2019. Repeat every 5 years  Mammogram status: Completed 03/22/2023. Repeat every year  Bone Density status: Ordered 07/21/2023. Pt provided with contact info and advised to call to schedule appt.  Lung Cancer Screening: (Low Dose CT Chest recommended if Age 52-80 years, 20 pack-year currently smoking OR have quit w/in 15years.) does not qualify.   Lung Cancer Screening Referral: n/a  Additional Screening:  Hepatitis C Screening: does qualify; Completed 07/17/2023  Vision Screening: Recommended annual ophthalmology exams for early detection of glaucoma and other disorders of the eye. Is the patient up to date with their annual eye exam?  Yes  Who is the provider or what is the name of the office in which the patient attends annual eye exams? [email or web address] If pt is not established with a provider, would they like  to be referred to a provider to establish care?  N/a .   Dental Screening: Recommended annual dental exams for proper oral hygiene   Community Resource Referral / Chronic Care Management: CRR required this visit?  No   CCM required this visit?  No     Plan:     I have personally reviewed and noted the following in the patient's chart:   Medical and social history Use of alcohol, tobacco or illicit drugs  Current medications and supplements including opioid prescriptions. Patient is not currently taking opioid prescriptions. Functional ability and status Nutritional status Physical  activity Advanced directives List of other physicians Hospitalizations, surgeries, and ER visits in previous 12 months Vitals Screenings to include cognitive, depression, and falls Referrals and appointments  In addition, I have reviewed and discussed with patient certain preventive protocols, quality metrics, and best practice recommendations. A written personalized care plan for preventive services as well as general preventive health recommendations were provided to patient.     Esmond Harps, [place]   08/14/2023   After Visit Summary: (MyChart) Due to this being a telephonic visit, the after visit summary with patients personalized plan was offered to patient via MyChart   Nurse Notes:   AZENETH CARBONELL is a 68 y.o. female patient of Metheney, Barbarann Ehlers, MD who had a Medicare Annual Wellness Visit today via telephone. Shakiara is Retired and lives with their family. She has 1 child. She reports that she is socially active and does interact with friends/family regularly. She is moderately physically active and enjoys staying active.    Recommend T-dap and Shingles vaccine at local pharmacy.   Recommend Pneumonia vaccine in office or at local pharmacy.   Bone density ordered.

## 2023-08-14 NOTE — Patient Instructions (Signed)
  Ms. Nudelman , Thank you for taking time to come for your Medicare Wellness Visit. I appreciate your ongoing commitment to your health goals. Please review the following plan we discussed and let me know if I can assist you in the future.   These are the goals we discussed:  Goals       Eat Healthy      She would like to continue a healthy lifestyle.       Patient Stated (pt-stated)      Continue to maintain her healthy lifestyle.        This is a list of the screening recommended for you and due dates:  Health Maintenance  Topic Date Due   Zoster (Shingles) Vaccine (1 of 2) 12/09/2004   Pneumonia Vaccine (1 of 1 - PCV) Never done   COVID-19 Vaccine (5 - 2024-25 season) 02/18/2023   Flu Shot  09/17/2023*   Medicare Annual Wellness Visit  08/13/2024   Colon Cancer Screening  11/23/2024   Mammogram  03/21/2025   DEXA scan (bone density measurement)  Completed   Hepatitis C Screening  Completed   HPV Vaccine  Aged Out   DTaP/Tdap/Td vaccine  Discontinued  *Topic was postponed. The date shown is not the original due date.

## 2023-08-15 ENCOUNTER — Ambulatory Visit: Payer: PPO | Admitting: Physical Therapy

## 2023-08-15 ENCOUNTER — Encounter: Payer: Self-pay | Admitting: Physical Therapy

## 2023-08-15 DIAGNOSIS — M5416 Radiculopathy, lumbar region: Secondary | ICD-10-CM

## 2023-08-15 DIAGNOSIS — M25571 Pain in right ankle and joints of right foot: Secondary | ICD-10-CM

## 2023-08-15 DIAGNOSIS — M6281 Muscle weakness (generalized): Secondary | ICD-10-CM

## 2023-08-15 NOTE — Therapy (Signed)
 OUTPATIENT PHYSICAL THERAPY TREATMENT and 10th visit note   Patient Name: Sheila Harris MRN: [id number] DOB:[birth year],68 y.o., female Today's Date: 08/15/2023  Dates of service 06/26/23 - 08/15/23 END OF SESSION:  PT End of Session - 08/15/23 1229     Visit Number 10    Number of Visits 17    Date for PT Re-Evaluation 08/25/23    Authorization Type [employer]    Authorization - Visit Number 10    Progress Note Due on Visit 20    PT Start Time 1150    PT Stop Time 1230    PT Time Calculation (min) 40 min    Activity Tolerance Patient tolerated treatment well    Behavior During Therapy WFL for tasks assessed/performed              Past Medical History:  Diagnosis Date   Genital herpes    Macular degeneration    Thyroid disease    History reviewed. No pertinent surgical history. Patient Active Problem List   Diagnosis Date Noted   Chronic pain of left wrist 05/07/2023   Anxiety disorder 01/11/2023   Contusion of knee, left 11/09/2022   Metatarsalgia of right foot 03/06/2022   Iron deficiency 02/23/2022   Dry age-related macular degeneration 12/08/2021   Chronic cough 08/11/2021   Chronic foot pain, right 07/12/2021   Left shoulder pain 05/05/2021   Hormone replacement therapy (HRT) 07/08/2020   Corn of foot right, 4/5 interspace 06/07/2020   Herpes infection 11/19/2019   Osteoporosis 11/19/2019   Factor V Leiden (HCC) 11/19/2019   Hypothyroidism 11/19/2019   Anxiety 11/19/2019   Trigger finger of right hand 06/07/2016   Carpal tunnel syndrome, right 02/22/2016   Primary osteoarthritis of right hand 12/30/2015   Degenerative disc disease, cervical 12/30/2015   Trigger finger of left hand 10/08/2015   Pes cavus 11/21/2013   Right lumbar radiculopathy 11/21/2013    PCP: Agapito Games, MD REFERRING PROVIDER: Monica Becton, MD  REFERRING DIAG: M54.16 (ICD-10-CM) - Right lumbar radiculopathy; foot pain  Rationale for Evaluation and  Treatment: Rehabilitation  THERAPY DIAG:  Radiculopathy, lumbar region  Pain in right ankle and joints of right foot  Muscle weakness (generalized)  ONSET DATE: acute on chronic with exacerbation in September 2024  SUBJECTIVE:                                                                                                                                                                                          The patient states she continues she continues with burning pain along her Rt side. She states she has home suction cups and  has been trying to use those for pain control  Per eval - Patient reports her back pain has been an issue on/off for 40 years of insidious onset.  The pain can radiate from her back down the back of the RLE. The heel is also very painful and has to roll it in the morning to make it feel better. Was told that it might be plantar fascitis from one doctor and another said it was coming from her back. She thinks the heel pain and exacerbation of back pain might be coming from an injury to the 2nd digit from pushing off the wall in the pool about 3.5 months ago. She denies any numbness or tingling. No red flag symptoms. She had an MRI on her back about a year ago and the doctor told her "overall the back looked pretty good."  PERTINENT HISTORY:  Osteoporosis   PAIN:  Are you having pain? Yes: NPRS scale: 6/10 Pain location: Rt posterior hip radiating to the foot. She also reports pain in R lat and into scapular region today Pain description: aching Aggravating factors: prolonged standing > 30 minutes, lifting Relieving factors: rolling out, TENS, short duration of sitting  PRECAUTIONS: None  WEIGHT BEARING RESTRICTIONS: No  FALLS:  Has patient fallen in last 6 months? No  LIVING ENVIRONMENT: Lives with: lives with their family Lives in: House/apartment Stairs: Yes: Internal: flight steps; on right going up Has following equipment at home: None  OCCUPATION:  works part-time, Psychologist, occupational   PLOF: Independent  PATIENT GOALS: "just to be able to get out of bed ok and stand a little longer."   OBJECTIVE:  Note: Objective measures were completed at Evaluation unless otherwise noted.  PATIENT SURVEYS:  FOTO 57% function to 64% predicted   SCREENING FOR RED FLAGS: Bowel or bladder incontinence: No Cauda equina syndrome: No  COGNITION: Overall cognitive status: Within functional limits for tasks assessed     MUSCLE LENGTH: Hamstrings: mild tightness bilaterally   POSTURE: decreased lumbar lordosis Rt ribcage lower in standing  Lt pelvis retracted in standing  Pes cavus  PALPATION: TTP proximal plantar fascia/plantar aspect of calcaneous Rt  LUMBAR ROM:  AROM eval  Flexion 10% limited   Extension 25% limited   Right lateral flexion 50% limited  Left lateral flexion 50% limited   Right rotation 50% limited  Left rotation 50% limited    (Blank rows = not tested)  LOWER EXTREMITY ROM:    Active  Right eval Left eval Right 07/19/23  Hip flexion     Hip extension     Hip abduction     Hip adduction     Hip internal rotation     Hip external rotation     Knee flexion     Knee extension     Ankle dorsiflexion 7  8 deg painless  Ankle plantarflexion     Ankle inversion     Ankle eversion      (Blank rows = not tested)  LOWER EXTREMITY MMT:   MMT Right eval Left eval  Hip flexion 4+ 4+  Hip extension 4 4  Hip abduction 4- 4-  Hip adduction    Hip internal rotation    Hip external rotation    Knee flexion 5 5  Knee extension 5 5  Ankle dorsiflexion 5 5  Ankle plantarflexion Partial SL calf raise Partial SL calf raise   Ankle inversion 4 4+  Ankle eversion 4 4+   (Blank rows = not  tested)  LUMBAR SPECIAL TESTS:  (-) SLR   FUNCTIONAL TESTS:  Squat: WNL SLS: 30 seconds LLE; 10 seconds RLE Trendelenburg bilateral   GAIT: Distance walked: 20 ft  Assistive device utilized: None Level of assistance: Complete  Independence Comments: limited push-off RLE   [facility] Adult PT Treatment:                                                DATE: 08/15/23 Therapeutic Exercise/Activity: QL stretch in door frame with tactile and demo cues R and L sides Prone hip ext 2 x 10 bilat Prone W 2 x 10 Childs pose forward and laterally Long sitting SLR lifting LE over yoga block for hip flexor strengthening Standing hip IR at doorway Manual Therapy: STM lumbar and thoracic paraspinals Trigger Point Dry Needling  Subsequent Treatment: Instructions provided previously at initial dry needling treatment.   Patient Verbal Consent Given: Yes Education Handout Provided: Previously Provided Muscles Treated: Rt lumbar paraspinals, Rt thoracic paraspinals Electrical Stimulation Performed: No Treatment Response/Outcome: twitch response, increased muscle length   [facility] Adult PT Treatment:                                                DATE: 08/13/23 Therapeutic Exercise: Standing QL stretch in door frame with tactile and demo cues R and L sides Modified down dog position with hands holding mat Reviewed hip IR HEP Single leg stance x 6 seconds R and 20 seconds L near support for intermittent UE holds Wall slides, mini squats x 5 reps each Seated Internal hip rotation R and L sides Supine Sciatic nerve glide Hip flexor stretch Sidelying Plank x 5 seconds x 5 reps R and L sides with demo (began modified with knees bent and moved to extended legs) Manual Therapy: Self mobilization with foam roller and demonstrated rotation to rollout R hip STM R QL and R glut med, R latissimus and erector spinae musculature Trigger Point Dry Needling Subsequent Treatment: Instructions reviewed, if requested by the patient, prior to subsequent dry needling treatment.  Patient Verbal Consent Given: Yes Education Handout Provided: Previously Provided Muscles Treated: R glut med, R latissimus---used threading parallel to muscle and reviewed  pneumothorax precautions Treatment Response/Outcome: reduced pain  [facility] Adult PT Treatment:                                                DATE: 07/25/23 Therapeutic Exercise: Doorway QL stretch 2x30sec (pt requests picture taken on her personal phone for reference with home program) Bent knee fallout AROM x10 supine Blue band paloff, kickstand x8 BIL green band kickstand paloff + OH x8 HEP update + education, inc time w/ education on rationale for interventions, relevant anatomy/physiology    [facility] Adult PT Treatment:                                                DATE: 07/19/23 Therapeutic Exercise: Doorway QL stretch R side 2x5 w/  breath control  Doorway rhomboid stretch x8  High>low row green band 2x12, blue band x10 Suitcase carry 5# KB 2x2ft BIL, 7# DB x68ft cues for posture  R sided sciatic nerve glide x15 HEP discussion/education  Therapeutic Activity: Checking STG, education/discussion re: progress w/ PT thus far, symptom behavior as it affects activities outside of sessions, relevant anatomy/physiology as it pertains to symptom behavior   PATIENT EDUCATION:  Education details: rationale for interventions, HEP  Person educated: Patient Education method: Explanation, Demonstration, Tactile cues, Verbal cues, handout  Education comprehension: verbalized understanding, returned demonstration, verbal cues required, tactile cues required, and needs further education      HOME EXERCISE PROGRAM: Access Code: [email or web address] URL: [email or web address] Date: 07/25/2023 Prepared by: Fransisco Hertz  Program Notes - with antirotation press, please do in staggered stance, with forward foot closest to wall, as done in clinic- with hip internal rotation, please perform at doorway with support as done in clinic  Exercises - Standing Anti-Rotation Press with Anchored Resistance  - 1 x daily - 7 x weekly - 2-3 sets - 8-10 reps - Supine Sciatic Nerve Glide  - 1 x daily - 7 x weekly  - 1 sets - 10 reps - Standing Quadratus Lumborum Stretch with Doorway  - 1 x daily - 7 x weekly - 2 sets - 2-3 reps - 30sec hold - Standing Single Leg Hip Internal Rotation  - 1 x daily - 7 x weekly - 2 sets - 8 reps  ASSESSMENT:  CLINICAL IMPRESSION: Continued to review technique with all exercises. Prone postural exercises today due to c/o thoracic pain. Pt continues to report good response to dry needling. She does have increased muscle spasticity in Rt > Lt paraspinals. PT continued to educate pt on importance of strength training to improve functional mobility and decrease pain. She will continue to benefit from strength and mobility training  Per eval - Patient is a 68 y.o. female who was seen today for physical therapy evaluation and treatment for chronic low back/posterior RLE pain and isolated Rt heel pain. She reports her back and RLE pain is a chronic issue, but has recently worsened over the past 3.5 months after injuring her Rt 2nd digit when pushing off the wall when swimming. Upon assessment she is noted to have limited trunk AROM, postural abnormalities, and hip weakness. Specific to the heel she does have palpable tenderness about proximal plantar fascia/plantar aspect of calcaneous, limited DF AROM, bilateral ankle weakness, balance deficits, and gait abnormalities. Did not have time to issue thorough HEP today due to patient being late for evaluation. She will benefit from skilled PT to address the above stated deficits in order to optimize her function and assist in overall pain reduction.    GOALS: Goals reviewed with patient? Yes  SHORT TERM GOALS: Target date: 07/24/2023  Patient will be independent and compliant with initial HEP.  Baseline: see above 07/19/23: reports ongoing HEP performance  Goal status: MET-- although patient needs cues for correct performance   2.  Patient will demonstrate at least 10 degrees of Rt ankle DF AROM to improve gait mechanics.  Baseline: see  above  07/19/23: 8 deg DF R ankle  Goal status: NEARLY MET   3.  Patient will maintain Rt SLS for at least 20 seconds with no signs of pelvic drop to improve overall stability.  Baseline: see above  07/18/22: pt requests to defer today Goal status: NOT MET 08/13/23  4.  Patient will complete full range calf  raise to improve push-off during gait cycle.  Baseline: see above  07/19/23: ~2cm difference compared to L Goal status: ONGOING   LONG TERM GOALS: Target date: 08/25/23  Patient will score at least 64% on FOTO to signify clinically meaningful improvement in functional abilities.  Baseline: see above Goal status: INITIAL  2.  Patient will be able to lift at least 15 lbs with proper form without pain.  Baseline: increased pain with lifting.  Goal status: INITIAL  3.  Patient will self-report tolerating at least 1 hour of standing activity.  Baseline: 30 minutes  Goal status: INITIAL  4.  Patient will demonstrate at least 4+/5 bilateral hip abductor strength to improve stability about the chain with prolonged walking/standing activity.  Baseline: see above Goal status: INITIAL  5.  Patient will be independent with advanced home program to progress/maintain current level of function.  Baseline: see above  Goal status: INITIAL  6.  Patient will demonstrate 5/5 ankle strength to improve gait stability.  Baseline: see above  Goal status: INITIAL  PLAN:  PT FREQUENCY: 1-2x/week  PT DURATION: 8 weeks  PLANNED INTERVENTIONS: 97164- PT Re-evaluation, 97110-Therapeutic exercises, 97530- Therapeutic activity, [id number]- Neuromuscular re-education, 97535- Self Care, [id number]- Manual therapy, [id number]- Gait training, [id number]- Aquatic Therapy, 97014- Electrical stimulation (unattended), [id number]- Electrical stimulation (manual), [id number]- Vasopneumatic device, [id number]- Ionotophoresis 4mg /ml Dexamethasone, Taping, Dry Needling, Cryotherapy, and Moist heat.  PLAN FOR NEXT SESSION: Continue with core/hip  strengthening, emphasis on R QL and glute max/med.  Patient has HEP from therapy last year (neck) that she does, this HEP, and other home exercises. May want to discuss routine where she alternates versus focusing on each of these every day.  Eloyse Causey, PT 08/15/2023 12:30 PM

## 2023-08-22 ENCOUNTER — Encounter: Payer: Self-pay | Admitting: Rehabilitative and Restorative Service Providers"

## 2023-08-22 ENCOUNTER — Encounter: Payer: Self-pay | Admitting: Family Medicine

## 2023-08-22 ENCOUNTER — Ambulatory Visit: Payer: PPO | Attending: Sports Medicine | Admitting: Rehabilitative and Restorative Service Providers"

## 2023-08-22 DIAGNOSIS — R293 Abnormal posture: Secondary | ICD-10-CM | POA: Diagnosis present

## 2023-08-22 DIAGNOSIS — M6281 Muscle weakness (generalized): Secondary | ICD-10-CM | POA: Diagnosis present

## 2023-08-22 DIAGNOSIS — M25571 Pain in right ankle and joints of right foot: Secondary | ICD-10-CM | POA: Diagnosis present

## 2023-08-22 DIAGNOSIS — M5416 Radiculopathy, lumbar region: Secondary | ICD-10-CM | POA: Diagnosis present

## 2023-08-22 NOTE — Therapy (Signed)
 OUTPATIENT PHYSICAL THERAPY TREATMENT    Patient Name: Sheila Harris MRN: [id number] DOB:[birth year],68 y.o., female Today's Date: 08/22/2023  END OF SESSION:  PT End of Session - 08/22/23 0949     Visit Number 11    Number of Visits 17    Date for PT Re-Evaluation 08/25/23    Authorization Type [employer]    Progress Note Due on Visit 20    PT Start Time 0950    PT Stop Time 1035    PT Time Calculation (min) 45 min    Activity Tolerance Patient tolerated treatment well    Behavior During Therapy [facility] for tasks assessed/performed             Past Medical History:  Diagnosis Date   Genital herpes    Macular degeneration    Thyroid disease    History reviewed. No pertinent surgical history. Patient Active Problem List   Diagnosis Date Noted   Chronic pain of left wrist 05/07/2023   Anxiety disorder 01/11/2023   Contusion of knee, left 11/09/2022   Metatarsalgia of right foot 03/06/2022   Iron deficiency 02/23/2022   Dry age-related macular degeneration 12/08/2021   Chronic cough 08/11/2021   Chronic foot pain, right 07/12/2021   Left shoulder pain 05/05/2021   Hormone replacement therapy (HRT) 07/08/2020   Corn of foot right, 4/5 interspace 06/07/2020   Herpes infection 11/19/2019   Osteoporosis 11/19/2019   Factor V Leiden (HCC) 11/19/2019   Hypothyroidism 11/19/2019   Anxiety 11/19/2019   Trigger finger of right hand 06/07/2016   Carpal tunnel syndrome, right 02/22/2016   Primary osteoarthritis of right hand 12/30/2015   Degenerative disc disease, cervical 12/30/2015   Trigger finger of left hand 10/08/2015   Pes cavus 11/21/2013   Right lumbar radiculopathy 11/21/2013   PCP: Agapito Games, MD REFERRING PROVIDER: Monica Becton, MD  REFERRING DIAG: M54.16 (ICD-10-CM) - Right lumbar radiculopathy; foot pain  Rationale for Evaluation and Treatment: Rehabilitation  THERAPY DIAG:  Radiculopathy, lumbar region  Pain in right ankle  and joints of right foot  Muscle weakness (generalized)  Abnormal posture  ONSET DATE: acute on chronic with exacerbation in September 2024  SUBJECTIVE:                                                                                                                                                                                          The patient states she continues she continues with aching and pain along her Rt side. She has been self massaging the R axillary area. She swam for an hour yesterday. She reports that she has h/o costochondritis about  1x/year and feels like this is flaring.  Per eval - Patient reports her back pain has been an issue on/off for 40 years of insidious onset.  The pain can radiate from her back down the back of the RLE. The heel is also very painful and has to roll it in the morning to make it feel better. Was told that it might be plantar fascitis from one doctor and another said it was coming from her back. She thinks the heel pain and exacerbation of back pain might be coming from an injury to the 2nd digit from pushing off the wall in the pool about 3.5 months ago. She denies any numbness or tingling. No red flag symptoms. She had an MRI on her back about a year ago and the doctor told her "overall the back looked pretty good."  PERTINENT HISTORY:  Osteoporosis   PAIN:  Are you having pain? Yes: NPRS scale: 6/10 Pain location: Rt posterior hip radiating to the foot. She also reports pain in R lat and into scapular region today Pain description: aching Aggravating factors: prolonged standing > 30 minutes, lifting Relieving factors: rolling out, TENS, short duration of sitting  PRECAUTIONS: None  WEIGHT BEARING RESTRICTIONS: No  FALLS:  Has patient fallen in last 6 months? No  LIVING ENVIRONMENT: Lives with: lives with their family Lives in: House/apartment Stairs: Yes: Internal: flight steps; on right going up Has following equipment at home:  None  PATIENT GOALS: "just to be able to get out of bed ok and stand a little longer."   OBJECTIVE:  Note: Objective measures were completed at Evaluation unless otherwise noted.  PATIENT SURVEYS:  FOTO 57% function to 64% predicted   SCREENING FOR RED FLAGS: Bowel or bladder incontinence: No Cauda equina syndrome: No  COGNITION: Overall cognitive status: Within functional limits for tasks assessed     MUSCLE LENGTH: Hamstrings: mild tightness bilaterally   POSTURE: decreased lumbar lordosis Rt ribcage lower in standing  Lt pelvis retracted in standing  Pes cavus  PALPATION: TTP proximal plantar fascia/plantar aspect of calcaneous Rt  LUMBAR ROM:  AROM eval  Flexion 10% limited   Extension 25% limited   Right lateral flexion 50% limited  Left lateral flexion 50% limited   Right rotation 50% limited  Left rotation 50% limited    (Blank rows = not tested)  LOWER EXTREMITY ROM:    Active  Right eval Left eval Right 07/19/23  Hip flexion     Hip extension     Hip abduction     Hip adduction     Hip internal rotation     Hip external rotation     Knee flexion     Knee extension     Ankle dorsiflexion 7  8 deg painless  Ankle plantarflexion     Ankle inversion     Ankle eversion      (Blank rows = not tested)  LOWER EXTREMITY MMT:   MMT Right eval Left eval Right Left  Hip flexion 4+ 4+ 4+/5 4/5  Hip extension 4 4 4+/5 5/5  Hip abduction 4- 4- 5/5 5/5  Hip adduction   5/5 5/5  Hip internal rotation      Hip external rotation      Knee flexion 5 5    Knee extension 5 5    Ankle dorsiflexion 5 5    Ankle plantarflexion Partial SL calf raise Partial SL calf raise     Ankle inversion 4 4+  Ankle eversion 4 4+     (Blank rows = not tested)  LUMBAR SPECIAL TESTS:  (-) SLR   FUNCTIONAL TESTS:  Squat: WNL SLS: 30 seconds LLE; 10 seconds RLE Trendelenburg bilateral   GAIT: Distance walked: 20 ft  Assistive device utilized: None Level of assistance:  Complete Independence Comments: limited push-off RLE   [facility] Adult PT Treatment:                                                DATE: 08/22/23 Therapeutic Exercise: Standing Reviewed QL door frame stretch multiple times with demo to ensure she is getting correct technique. Paloff preff x 10 reps R and L sides red band Hip IR from HEP x 10 reps each side Long sitting SLR with up/over yoga block Manual Therapy: STM for bilateral paraspinals in thoracic and lumbar region, R QL STM for R infraspinatus and pectoralis musculature Trigger Point Dry Needling Subsequent Treatment: Instructions reviewed, if requested by the patient, prior to subsequent dry needling treatment.  Patient Verbal Consent Given: Yes Education Handout Provided: Previously Provided Muscles Treated: R pectoralis with pull away technique, R QL prone position, R infraspinatus-- although used scapula as bony back drop watched needle depth due to h/o osteoporosis Treatment Response/Outcome: palpable lengthening   [facility] Adult PT Treatment:                                                DATE: 08/15/23 Therapeutic Exercise/Activity: QL stretch in door frame with tactile and demo cues R and L sides Prone hip ext 2 x 10 bilat Prone W 2 x 10 Childs pose forward and laterally Long sitting SLR lifting LE over yoga block for hip flexor strengthening Standing hip IR at doorway Manual Therapy: STM lumbar and thoracic paraspinals Trigger Point Dry Needling Subsequent Treatment: Instructions provided previously at initial dry needling treatment.  Patient Verbal Consent Given: Yes Education Handout Provided: Previously Provided Muscles Treated: Rt lumbar paraspinals, Rt thoracic paraspinals Electrical Stimulation Performed: No Treatment Response/Outcome: twitch response, increased muscle length   [facility] Adult PT Treatment:                                                DATE: 08/13/23 Therapeutic Exercise: Standing QL stretch in door  frame with tactile and demo cues R and L sides Modified down dog position with hands holding mat Reviewed hip IR HEP Single leg stance x 6 seconds R and 20 seconds L near support for intermittent UE holds Wall slides, mini squats x 5 reps each Seated Internal hip rotation R and L sides Supine Sciatic nerve glide Hip flexor stretch Sidelying Plank x 5 seconds x 5 reps R and L sides with demo (began modified with knees bent and moved to extended legs) Manual Therapy: Self mobilization with foam roller and demonstrated rotation to rollout R hip STM R QL and R glut med, R latissimus and erector spinae musculature Trigger Point Dry Needling Subsequent Treatment: Instructions reviewed, if requested by the patient, prior to subsequent dry needling treatment.  Patient Verbal Consent Given: Yes Education Handout  Provided: Previously Provided Muscles Treated: R glut med, R latissimus---used threading parallel to muscle and reviewed pneumothorax precautions Treatment Response/Outcome: reduced pain  PATIENT EDUCATION:  Education details: rationale for interventions, HEP  Person educated: Patient Education method: Explanation, Demonstration, Tactile cues, Verbal cues, handout  Education comprehension: verbalized understanding, returned demonstration, verbal cues required, tactile cues required, and needs further education      HOME EXERCISE PROGRAM: Access Code: [email or web address] URL: [email or web address] Date: 07/25/2023 Prepared by: Fransisco Hertz  Program Notes - with antirotation press, please do in staggered stance, with forward foot closest to wall, as done in clinic- with hip internal rotation, please perform at doorway with support as done in clinic  Exercises - Standing Anti-Rotation Press with Anchored Resistance  - 1 x daily - 7 x weekly - 2-3 sets - 8-10 reps - Supine Sciatic Nerve Glide  - 1 x daily - 7 x weekly - 1 sets - 10 reps - Standing Quadratus Lumborum Stretch  with Doorway  - 1 x daily - 7 x weekly - 2 sets - 2-3 reps - 30sec hold - Standing Single Leg Hip Internal Rotation  - 1 x daily - 7 x weekly - 2 sets - 8 reps  ASSESSMENT:  CLINICAL IMPRESSION: PT and patient began checking LTGs.  We discussed potential d/c next session as she has HEP, and is also not noticing significant improvement or reduction in pain. Pt continues to report good response to dry needling.   Per eval - Patient is a 68 y.o. female who was seen today for physical therapy evaluation and treatment for chronic low back/posterior RLE pain and isolated Rt heel pain. She reports her back and RLE pain is a chronic issue, but has recently worsened over the past 3.5 months after injuring her Rt 2nd digit when pushing off the wall when swimming. Upon assessment she is noted to have limited trunk AROM, postural abnormalities, and hip weakness. Specific to the heel she does have palpable tenderness about proximal plantar fascia/plantar aspect of calcaneous, limited DF AROM, bilateral ankle weakness, balance deficits, and gait abnormalities. Did not have time to issue thorough HEP today due to patient being late for evaluation. She will benefit from skilled PT to address the above stated deficits in order to optimize her function and assist in overall pain reduction.    GOALS: Goals reviewed with patient? Yes  SHORT TERM GOALS: Target date: 07/24/2023  Patient will be independent and compliant with initial HEP.  Baseline: see above 07/19/23: reports ongoing HEP performance  Goal status: MET-- although patient needs cues for correct performance   2.  Patient will demonstrate at least 10 degrees of Rt ankle DF AROM to improve gait mechanics.  Baseline: see above  07/19/23: 8 deg DF R ankle  Goal status: NEARLY MET   3.  Patient will maintain Rt SLS for at least 20 seconds with no signs of pelvic drop to improve overall stability.  Baseline: see above  07/18/22: pt requests to defer  today Goal status: NOT MET 08/13/23  4.  Patient will complete full range calf raise to improve push-off during gait cycle.  Baseline: see above  07/19/23: ~2cm difference compared to L Goal status: ONGOING   LONG TERM GOALS: Target date: 08/25/23  Patient will score at least 64% on FOTO to signify clinically meaningful improvement in functional abilities.  Baseline: see above Goal status: INITIAL  2.  Patient will be able to lift at least 15 lbs with proper form without  pain.  Baseline: increased pain with lifting.  Goal status: NOT MET 08/22/23 -- gets pain with lifting 10# in R QL  3.  Patient will self-report tolerating at least 1 hour of standing activity.  Baseline: 30 minutes  Goal status: INITIAL  4.  Patient will demonstrate at least 4+/5 bilateral hip abductor strength to improve stability about the chain with prolonged walking/standing activity.  Baseline: see above Goal status: MET  5.  Patient will be independent with advanced home program to progress/maintain current level of function.  Baseline: see above  Goal status:MET  6.  Patient will demonstrate 5/5 ankle strength to improve gait stability.  Baseline: see above  Goal status: INITIAL  PLAN:  PT FREQUENCY: 1-2x/week  PT DURATION: 8 weeks  PLANNED INTERVENTIONS: 97164- PT Re-evaluation, 97110-Therapeutic exercises, 97530- Therapeutic activity, [id number]- Neuromuscular re-education, 97535- Self Care, [id number]- Manual therapy, [id number]- Gait training, [id number]- Aquatic Therapy, 97014- Electrical stimulation (unattended), [id number]- Electrical stimulation (manual), [id number]- Vasopneumatic device, [id number]- Ionotophoresis 4mg /ml Dexamethasone, Taping, Dry Needling, Cryotherapy, and Moist heat.  PLAN FOR NEXT SESSION: Review LTGs and anticipate d/c.  , FOTO, ankle strength, ask about standing.  Mizani Dilday, PT 08/22/2023 10:39 AM

## 2023-08-23 MED ORDER — FLUOXETINE HCL 10 MG PO TABS: 10.0000 mg | ORAL_TABLET | Freq: Every day | ORAL | 1 refills | Status: DC

## 2023-08-24 ENCOUNTER — Encounter: Payer: Self-pay | Admitting: Rehabilitative and Restorative Service Providers"

## 2023-08-24 ENCOUNTER — Ambulatory Visit: Payer: PPO | Admitting: Rehabilitative and Restorative Service Providers"

## 2023-08-24 DIAGNOSIS — M722 Plantar fascial fibromatosis: Secondary | ICD-10-CM | POA: Diagnosis not present

## 2023-08-24 DIAGNOSIS — M25551 Pain in right hip: Secondary | ICD-10-CM | POA: Diagnosis not present

## 2023-08-24 DIAGNOSIS — M9904 Segmental and somatic dysfunction of sacral region: Secondary | ICD-10-CM | POA: Diagnosis not present

## 2023-08-24 DIAGNOSIS — M9906 Segmental and somatic dysfunction of lower extremity: Secondary | ICD-10-CM | POA: Diagnosis not present

## 2023-08-24 DIAGNOSIS — M9903 Segmental and somatic dysfunction of lumbar region: Secondary | ICD-10-CM | POA: Diagnosis not present

## 2023-08-24 DIAGNOSIS — M5459 Other low back pain: Secondary | ICD-10-CM | POA: Diagnosis not present

## 2023-08-24 DIAGNOSIS — M9905 Segmental and somatic dysfunction of pelvic region: Secondary | ICD-10-CM | POA: Diagnosis not present

## 2023-08-24 DIAGNOSIS — M9908 Segmental and somatic dysfunction of rib cage: Secondary | ICD-10-CM | POA: Diagnosis not present

## 2023-08-24 DIAGNOSIS — M5416 Radiculopathy, lumbar region: Secondary | ICD-10-CM | POA: Diagnosis not present

## 2023-08-24 DIAGNOSIS — M25571 Pain in right ankle and joints of right foot: Secondary | ICD-10-CM

## 2023-08-24 DIAGNOSIS — M79671 Pain in right foot: Secondary | ICD-10-CM | POA: Diagnosis not present

## 2023-08-24 DIAGNOSIS — M6281 Muscle weakness (generalized): Secondary | ICD-10-CM

## 2023-08-24 DIAGNOSIS — M9902 Segmental and somatic dysfunction of thoracic region: Secondary | ICD-10-CM | POA: Diagnosis not present

## 2023-08-24 NOTE — Therapy (Signed)
 OUTPATIENT PHYSICAL THERAPY TREATMENT AND DISCHARGE   Patient Name: Sheila Harris MRN: [id number] DOB:1955/09/20,68 y.o., female Today's Date: 08/24/2023   PHYSICAL THERAPY DISCHARGE SUMMARY  Visits from Start of Care: 12  Current functional level related to goals / functional outcomes: See goals below   Remaining deficits: Continued pain-- has thorough HEP and able to progress at gym.   Education / Equipment: HEP   Patient agrees to discharge. Patient goals were partially met. Patient is being discharged due to meeting the stated rehab goals.  END OF SESSION:  PT End of Session - 08/24/23 1106     Visit Number 12    Number of Visits 17    Date for PT Re-Evaluation 08/25/23    Authorization Type [employer]    Progress Note Due on Visit 20    PT Start Time 1105    PT Stop Time 1145    PT Time Calculation (min) 40 min    Activity Tolerance Patient tolerated treatment well    Behavior During Therapy WFL for tasks assessed/performed              Past Medical History:  Diagnosis Date   Genital herpes    Macular degeneration    Thyroid disease    History reviewed. No pertinent surgical history. Patient Active Problem List   Diagnosis Date Noted   Chronic pain of left wrist 05/07/2023   Anxiety disorder 01/11/2023   Contusion of knee, left 11/09/2022   Metatarsalgia of right foot 03/06/2022   Iron deficiency 02/23/2022   Dry age-related macular degeneration 12/08/2021   Chronic cough 08/11/2021   Chronic foot pain, right 07/12/2021   Left shoulder pain 05/05/2021   Hormone replacement therapy (HRT) 07/08/2020   Corn of foot right, 4/5 interspace 06/07/2020   Herpes infection 11/19/2019   Osteoporosis 11/19/2019   Factor V Leiden (HCC) 11/19/2019   Hypothyroidism 11/19/2019   Anxiety 11/19/2019   Trigger finger of right hand 06/07/2016   Carpal tunnel syndrome, right 02/22/2016   Primary osteoarthritis of right hand 12/30/2015   Degenerative  disc disease, cervical 12/30/2015   Trigger finger of left hand 10/08/2015   Pes cavus 11/21/2013   Right lumbar radiculopathy 11/21/2013   PCP: Agapito Games, MD REFERRING PROVIDER: Monica Becton, MD  REFERRING DIAG: M54.16 (ICD-10-CM) - Right lumbar radiculopathy; foot pain  Rationale for Evaluation and Treatment: Rehabilitation  THERAPY DIAG:  Radiculopathy, lumbar region  Pain in right ankle and joints of right foot  Muscle weakness (generalized)  ONSET DATE: acute on chronic with exacerbation in September 2024  SUBJECTIVE:  The patient reports that she needs to reschedule visit with Dr. Benjamin Stain-- she had to cancel due to Covid. She notes she is sore and painful, but does not want to have injections. She notes some continued flare in R scapula, lateral rib pain, and psoas. She has R side aching.  Per eval - Patient reports her back pain has been an issue on/off for 40 years of insidious onset.  The pain can radiate from her back down the back of the RLE. The heel is also very painful and has to roll it in the morning to make it feel better. Was told that it might be plantar fascitis from one doctor and another said it was coming from her back. She thinks the heel pain and exacerbation of back pain might be coming from an injury to the 2nd digit from pushing off the wall in the pool about 3.5 months ago. She denies any numbness or tingling. No red flag symptoms. She had an MRI on her back about a year ago and the doctor told her "overall the back looked pretty good."  PERTINENT HISTORY:  Osteoporosis   PAIN:  Are you having pain? Yes: NPRS scale: 6/10 Pain location: Rt posterior hip radiating to the foot. She also reports pain in R lat and into scapular region today Pain  description: aching Aggravating factors: prolonged standing > 30 minutes, lifting Relieving factors: rolling out, TENS, short duration of sitting  PRECAUTIONS: None  WEIGHT BEARING RESTRICTIONS: No  FALLS:  Has patient fallen in last 6 months? No  LIVING ENVIRONMENT: Lives with: lives with their family Lives in: House/apartment Stairs: Yes: Internal: flight steps; on right going up Has following equipment at home: None  PATIENT GOALS: "just to be able to get out of bed ok and stand a little longer."   OBJECTIVE:  Note: Objective measures were completed at Evaluation unless otherwise noted.  PATIENT SURVEYS:  FOTO 57% function to 64% predicted   SCREENING FOR RED FLAGS: Bowel or bladder incontinence: No Cauda equina syndrome: No  COGNITION: Overall cognitive status: Within functional limits for tasks assessed     MUSCLE LENGTH: Hamstrings: mild tightness bilaterally   POSTURE: decreased lumbar lordosis Rt ribcage lower in standing  Lt pelvis retracted in standing  Pes cavus  PALPATION: TTP proximal plantar fascia/plantar aspect of calcaneous Rt  LUMBAR ROM:  AROM eval  Flexion 10% limited   Extension 25% limited   Right lateral flexion 50% limited  Left lateral flexion 50% limited   Right rotation 50% limited  Left rotation 50% limited    (Blank rows = not tested)  LOWER EXTREMITY ROM:    Active  Right eval Left eval Right 07/19/23  Hip flexion     Hip extension     Hip abduction     Hip adduction     Hip internal rotation     Hip external rotation     Knee flexion     Knee extension     Ankle dorsiflexion 7  8 deg painless  Ankle plantarflexion     Ankle inversion     Ankle eversion      (Blank rows = not tested)  LOWER EXTREMITY MMT:   MMT Right eval Left eval Right Left  Hip flexion 4+ 4+ 4+/5 4/5  Hip extension 4 4 4+/5 5/5  Hip abduction 4- 4- 5/5 5/5  Hip adduction   5/5 5/5  Hip internal rotation      Hip external rotation  Knee  flexion 5 5    Knee extension 5 5    Ankle dorsiflexion 5 5    Ankle plantarflexion Partial SL calf raise Partial SL calf raise     Ankle inversion 4 4+    Ankle eversion 4 4+     (Blank rows = not tested)  LUMBAR SPECIAL TESTS:  (-) SLR   FUNCTIONAL TESTS:  Squat: WNL SLS: 30 seconds LLE; 10 seconds RLE Trendelenburg bilateral   GAIT: Distance walked: 20 ft  Assistive device utilized: None Level of assistance: Complete Independence Comments: limited push-off RLE   [facility] Adult PT Treatment:                                                DATE: 08/24/23 Therapeutic Exercise: Supine SLR over yoga block x 12 reps Standing Farmer's carry 10# and 5#  Side plank modified at wall (hurt when in sidelying 2 sessions ago) x 10 second holds x 3 reps Self Care: Discussed exercise routine (billed under there ex) recommending 150 minutes of moderate cardio every week, 2-3 days of strength training, and 1 day of stretching. We discussed swimming routine, access to gym and building slow/steady progression of strength training.    [facility] Adult PT Treatment:                                                DATE: 08/22/23 Therapeutic Exercise: Standing Reviewed QL door frame stretch multiple times with demo to ensure she is getting correct technique. Paloff preff x 10 reps R and L sides red band Hip IR from HEP x 10 reps each side Long sitting SLR with up/over yoga block Manual Therapy: STM for bilateral paraspinals in thoracic and lumbar region, R QL STM for R infraspinatus and pectoralis musculature Trigger Point Dry Needling Subsequent Treatment: Instructions reviewed, if requested by the patient, prior to subsequent dry needling treatment.  Patient Verbal Consent Given: Yes Education Handout Provided: Previously Provided Muscles Treated: R pectoralis with pull away technique, R QL prone position, R infraspinatus-- although used scapula as bony back drop watched needle depth due to h/o  osteoporosis Treatment Response/Outcome: palpable lengthening   [facility] Adult PT Treatment:                                                DATE: 08/15/23 Therapeutic Exercise/Activity: QL stretch in door frame with tactile and demo cues R and L sides Prone hip ext 2 x 10 bilat Prone W 2 x 10 Childs pose forward and laterally Long sitting SLR lifting LE over yoga block for hip flexor strengthening Standing hip IR at doorway Manual Therapy: STM lumbar and thoracic paraspinals Trigger Point Dry Needling Subsequent Treatment: Instructions provided previously at initial dry needling treatment.  Patient Verbal Consent Given: Yes Education Handout Provided: Previously Provided Muscles Treated: Rt lumbar paraspinals, Rt thoracic paraspinals Electrical Stimulation Performed: No Treatment Response/Outcome: twitch response, increased muscle length   [facility] Adult PT Treatment:  DATE: 08/13/23 Therapeutic Exercise: Standing QL stretch in door frame with tactile and demo cues R and L sides Modified down dog position with hands holding mat Reviewed hip IR HEP Single leg stance x 6 seconds R and 20 seconds L near support for intermittent UE holds Wall slides, mini squats x 5 reps each Seated Internal hip rotation R and L sides Supine Sciatic nerve glide Hip flexor stretch Sidelying Plank x 5 seconds x 5 reps R and L sides with demo (began modified with knees bent and moved to extended legs) Manual Therapy: Self mobilization with foam roller and demonstrated rotation to rollout R hip STM R QL and R glut med, R latissimus and erector spinae musculature Trigger Point Dry Needling Subsequent Treatment: Instructions reviewed, if requested by the patient, prior to subsequent dry needling treatment.  Patient Verbal Consent Given: Yes Education Handout Provided: Previously Provided Muscles Treated: R glut med, R latissimus---used threading parallel to  muscle and reviewed pneumothorax precautions Treatment Response/Outcome: reduced pain  PATIENT EDUCATION:  Education details: rationale for interventions, HEP  Person educated: Patient Education method: Explanation, Demonstration, Tactile cues, Verbal cues, handout  Education comprehension: verbalized understanding, returned demonstration, verbal cues required, tactile cues required, and needs further education      HOME EXERCISE PROGRAM: Access Code: [email or web address] URL: [email or web address] Date: 08/24/2023 Prepared by: Margretta Ditty  Program Notes - with antirotation press, please do in staggered stance, with forward foot closest to wall, as done in clinic- with hip internal rotation, please perform at doorway with support as done in clinic  Exercises - Standing Quadratus Lumborum Stretch with Doorway  - 1 x daily - 4 x weekly - 2 sets - 2-3 reps - 30sec hold - Standing Anti-Rotation Press with Anchored Resistance  - 1 x daily - 4 x weekly - 2-3 sets - 8-10 reps - Standing Single Leg Hip Internal Rotation  - 1 x daily - 4 x weekly - 2 sets - 8 reps - Supine Sciatic Nerve Glide  - 1 x daily - 4 x weekly - 1 sets - 10 reps - Straight Leg Raise with Arm Support  - 1 x daily - 4 x weekly - 2 sets - 10 reps   ASSESSMENT:  CLINICAL IMPRESSION: The patient has h/o chronic back pain and R side body pain. She has HEP and PT recommends consolidating all programs (she discusses yoga, TRX, stretching and swimming) to consist of 150 minutes of cardio, 2-3 days of strength training, and one day of general mobility with yoga/stretching. We discussed reducing the # of things she is doing and really focusing on strengthening and load tolerance by slowly increasing resistance. Patient agrees with plan.   Per eval - Patient is a 68 y.o. female who was seen today for physical therapy evaluation and treatment for chronic low back/posterior RLE pain and isolated Rt heel pain. She reports her  back and RLE pain is a chronic issue, but has recently worsened over the past 3.5 months after injuring her Rt 2nd digit when pushing off the wall when swimming. Upon assessment she is noted to have limited trunk AROM, postural abnormalities, and hip weakness. Specific to the heel she does have palpable tenderness about proximal plantar fascia/plantar aspect of calcaneous, limited DF AROM, bilateral ankle weakness, balance deficits, and gait abnormalities. Did not have time to issue thorough HEP today due to patient being late for evaluation. She will benefit from skilled PT to address the above stated deficits in order to optimize  her function and assist in overall pain reduction.    GOALS: Goals reviewed with patient? Yes  SHORT TERM GOALS: Target date: 07/24/2023  Patient will be independent and compliant with initial HEP.  Baseline: see above 07/19/23: reports ongoing HEP performance  Goal status: MET-- although patient needs cues for correct performance   2.  Patient will demonstrate at least 10 degrees of Rt ankle DF AROM to improve gait mechanics.  Baseline: see above  07/19/23: 8 deg DF R ankle  Goal status: NEARLY MET   3.  Patient will maintain Rt SLS for at least 20 seconds with no signs of pelvic drop to improve overall stability.  Baseline: see above  07/18/22: pt requests to defer today Goal status: NOT MET 08/13/23  4.  Patient will complete full range calf raise to improve push-off during gait cycle.  Baseline: see above  07/19/23: ~2cm difference compared to L Goal status: ONGOING   LONG TERM GOALS: Target date: 08/25/23  Patient will score at least 64% on FOTO to signify clinically meaningful improvement in functional abilities.  Baseline: see above Goal status: Not tested  2.  Patient will be able to lift at least 15 lbs with proper form without pain.  Baseline: increased pain with lifting.  Goal status: NOT MET 08/22/23 -- gets pain with lifting 10# in R QL  3.  Patient  will self-report tolerating at least 1 hour of standing activity.  Baseline: 30 minutes  Goal status: NOT MET-- she takes breaks; she can walk for almost an hour, but thinks she would need breaks  4.  Patient will demonstrate at least 4+/5 bilateral hip abductor strength to improve stability about the chain with prolonged walking/standing activity.  Baseline: see above Goal status: MET  5.  Patient will be independent with advanced home program to progress/maintain current level of function.  Baseline: see above  Goal status:MET  6.  Patient will demonstrate 5/5 ankle strength to improve gait stability.  Baseline: see above  Goal status: DID NOT RETEST  PLAN:  PT FREQUENCY: 1-2x/week  PT DURATION: 8 weeks  PLANNED INTERVENTIONS: 97164- PT Re-evaluation, 97110-Therapeutic exercises, 97530- Therapeutic activity, [id number]- Neuromuscular re-education, 97535- Self Care, 72536- Manual therapy, [id number]- Gait training, [id number]- Aquatic Therapy, 97014- Electrical stimulation (unattended), [id number]- Electrical stimulation (manual), [id number]- Vasopneumatic device, [id number]- Ionotophoresis 4mg /ml Dexamethasone, Taping, Dry Needling, Cryotherapy, and Moist heat.  PLAN FOR NEXT SESSION: DISCHARGE  Shifra Swartzentruber, PT 08/24/2023 11:06 AM

## 2023-09-05 DIAGNOSIS — H35363 Drusen (degenerative) of macula, bilateral: Secondary | ICD-10-CM | POA: Diagnosis not present

## 2023-09-05 DIAGNOSIS — H35723 Serous detachment of retinal pigment epithelium, bilateral: Secondary | ICD-10-CM | POA: Diagnosis not present

## 2023-09-05 DIAGNOSIS — H2513 Age-related nuclear cataract, bilateral: Secondary | ICD-10-CM | POA: Diagnosis not present

## 2023-09-05 DIAGNOSIS — H353132 Nonexudative age-related macular degeneration, bilateral, intermediate dry stage: Secondary | ICD-10-CM | POA: Diagnosis not present

## 2023-09-06 ENCOUNTER — Encounter (INDEPENDENT_AMBULATORY_CARE_PROVIDER_SITE_OTHER): Payer: Self-pay | Admitting: Sports Medicine

## 2023-09-06 DIAGNOSIS — M79671 Pain in right foot: Secondary | ICD-10-CM

## 2023-09-06 DIAGNOSIS — G8929 Other chronic pain: Secondary | ICD-10-CM

## 2023-09-06 NOTE — Telephone Encounter (Signed)

## 2023-09-18 DIAGNOSIS — M79671 Pain in right foot: Secondary | ICD-10-CM | POA: Diagnosis not present

## 2023-09-18 DIAGNOSIS — M7740 Metatarsalgia, unspecified foot: Secondary | ICD-10-CM | POA: Diagnosis not present

## 2023-09-18 DIAGNOSIS — M9905 Segmental and somatic dysfunction of pelvic region: Secondary | ICD-10-CM | POA: Diagnosis not present

## 2023-09-18 DIAGNOSIS — M9906 Segmental and somatic dysfunction of lower extremity: Secondary | ICD-10-CM | POA: Diagnosis not present

## 2023-09-18 DIAGNOSIS — R222 Localized swelling, mass and lump, trunk: Secondary | ICD-10-CM | POA: Diagnosis not present

## 2023-09-18 DIAGNOSIS — M9904 Segmental and somatic dysfunction of sacral region: Secondary | ICD-10-CM | POA: Diagnosis not present

## 2023-09-20 DIAGNOSIS — M216X1 Other acquired deformities of right foot: Secondary | ICD-10-CM | POA: Diagnosis not present

## 2023-09-20 DIAGNOSIS — Q667 Congenital pes cavus, unspecified foot: Secondary | ICD-10-CM | POA: Diagnosis not present

## 2023-09-20 DIAGNOSIS — M7741 Metatarsalgia, right foot: Secondary | ICD-10-CM | POA: Diagnosis not present

## 2023-09-27 DIAGNOSIS — M216X2 Other acquired deformities of left foot: Secondary | ICD-10-CM | POA: Diagnosis not present

## 2023-09-27 DIAGNOSIS — M722 Plantar fascial fibromatosis: Secondary | ICD-10-CM | POA: Diagnosis not present

## 2023-09-27 DIAGNOSIS — L84 Corns and callosities: Secondary | ICD-10-CM | POA: Diagnosis not present

## 2023-09-27 DIAGNOSIS — M79671 Pain in right foot: Secondary | ICD-10-CM | POA: Diagnosis not present

## 2023-09-27 DIAGNOSIS — M7662 Achilles tendinitis, left leg: Secondary | ICD-10-CM | POA: Diagnosis not present

## 2023-09-27 DIAGNOSIS — R269 Unspecified abnormalities of gait and mobility: Secondary | ICD-10-CM | POA: Diagnosis not present

## 2023-10-16 DIAGNOSIS — M25532 Pain in left wrist: Secondary | ICD-10-CM | POA: Diagnosis not present

## 2023-10-16 DIAGNOSIS — M9907 Segmental and somatic dysfunction of upper extremity: Secondary | ICD-10-CM | POA: Diagnosis not present

## 2023-10-16 DIAGNOSIS — M9906 Segmental and somatic dysfunction of lower extremity: Secondary | ICD-10-CM | POA: Diagnosis not present

## 2023-10-16 DIAGNOSIS — M9904 Segmental and somatic dysfunction of sacral region: Secondary | ICD-10-CM | POA: Diagnosis not present

## 2023-10-16 DIAGNOSIS — M79662 Pain in left lower leg: Secondary | ICD-10-CM | POA: Diagnosis not present

## 2023-10-16 DIAGNOSIS — M9905 Segmental and somatic dysfunction of pelvic region: Secondary | ICD-10-CM | POA: Diagnosis not present

## 2023-10-16 DIAGNOSIS — M25562 Pain in left knee: Secondary | ICD-10-CM | POA: Diagnosis not present

## 2023-10-18 DIAGNOSIS — M25562 Pain in left knee: Secondary | ICD-10-CM | POA: Diagnosis not present

## 2023-10-26 ENCOUNTER — Telehealth: Admitting: Family Medicine

## 2023-10-26 ENCOUNTER — Ambulatory Visit: Payer: Self-pay | Admitting: Family Medicine

## 2023-10-26 DIAGNOSIS — M545 Low back pain, unspecified: Secondary | ICD-10-CM

## 2023-10-26 NOTE — Progress Notes (Signed)
 [place]   7/10 back pain with no improvement with tens unit, heating pad and OTC meds- she is concerned for compound fx given long history of back pain. Needs in person assessment.   Patient acknowledged agreement and understanding of the plan.

## 2023-10-26 NOTE — Telephone Encounter (Signed)
 Chief Complaint: Back pain (mid to lower) x2 days  Symptoms: 7/10 pain level intermittent Pertinent Negatives: Patient denies pain radiation, weakness, numbness, or problems with bowel/bladder control   Disposition: [x] Urgent Care (no appt availability in office)   Additional Notes: Pt states she was sitting down putting on her shoe and she felt a back spasm. Pt scheduled for a virtual urgent care visit today as no appointment availability in office.   Copied from CRM [id number]. Topic: Clinical - Red Word Triage >> Oct 26, 2023  9:42 AM Danelle Dunning F wrote: Red Word that prompted transfer to Nurse Triage:   Back pain; Pain level is a 7/ 10 Reason for Disposition  [1] SEVERE back pain (e.g., excruciating, unable to do any normal activities) AND [2] not improved 2 hours after pain medicine  Answer Assessment - Initial Assessment Questions Chief Complaint: Back pain (mid to lower) x2 days  Symptoms: 7/10 pain level intermittent  Pertinent Negatives: Patient denies pain radiation, weakness, numbness, or problems with bowel/bladder control  Protocols used: Back Pain-A-AH

## 2023-10-30 ENCOUNTER — Other Ambulatory Visit: Payer: Self-pay | Admitting: Family Medicine

## 2023-10-30 DIAGNOSIS — M9903 Segmental and somatic dysfunction of lumbar region: Secondary | ICD-10-CM | POA: Diagnosis not present

## 2023-10-30 DIAGNOSIS — M9902 Segmental and somatic dysfunction of thoracic region: Secondary | ICD-10-CM | POA: Diagnosis not present

## 2023-10-30 DIAGNOSIS — M9905 Segmental and somatic dysfunction of pelvic region: Secondary | ICD-10-CM | POA: Diagnosis not present

## 2023-10-30 DIAGNOSIS — M9901 Segmental and somatic dysfunction of cervical region: Secondary | ICD-10-CM | POA: Diagnosis not present

## 2023-11-02 ENCOUNTER — Encounter (INDEPENDENT_AMBULATORY_CARE_PROVIDER_SITE_OTHER): Payer: Self-pay | Admitting: Sports Medicine

## 2023-11-02 DIAGNOSIS — M5416 Radiculopathy, lumbar region: Secondary | ICD-10-CM

## 2023-11-02 NOTE — Telephone Encounter (Signed)

## 2023-11-05 DIAGNOSIS — M5459 Other low back pain: Secondary | ICD-10-CM | POA: Diagnosis not present

## 2023-11-05 DIAGNOSIS — M9904 Segmental and somatic dysfunction of sacral region: Secondary | ICD-10-CM | POA: Diagnosis not present

## 2023-11-05 DIAGNOSIS — M9903 Segmental and somatic dysfunction of lumbar region: Secondary | ICD-10-CM | POA: Diagnosis not present

## 2023-11-05 DIAGNOSIS — M9906 Segmental and somatic dysfunction of lower extremity: Secondary | ICD-10-CM | POA: Diagnosis not present

## 2023-11-05 DIAGNOSIS — M546 Pain in thoracic spine: Secondary | ICD-10-CM | POA: Diagnosis not present

## 2023-11-05 DIAGNOSIS — M9902 Segmental and somatic dysfunction of thoracic region: Secondary | ICD-10-CM | POA: Diagnosis not present

## 2023-11-05 DIAGNOSIS — M62830 Muscle spasm of back: Secondary | ICD-10-CM | POA: Diagnosis not present

## 2023-11-05 DIAGNOSIS — M9905 Segmental and somatic dysfunction of pelvic region: Secondary | ICD-10-CM | POA: Diagnosis not present

## 2023-11-05 NOTE — Therapy (Signed)
 OUTPATIENT PHYSICAL THERAPY THORACOLUMBAR EVALUATION   Patient Name: Sheila Harris MRN: [id number] DOB:August 17, 1955, 68 y.o., female Today's Date: 11/07/2023  END OF SESSION:  PT End of Session - 11/07/23 0851     Visit Number 1    Number of Visits 16    Date for PT Re-Evaluation 01/02/24    Authorization Type [employer] $15 copay    Authorization - Visit Number 1    Progress Note Due on Visit 10    PT Start Time 0845    PT Stop Time 0930    PT Time Calculation (min) 45 min    Activity Tolerance Patient tolerated treatment well             Past Medical History:  Diagnosis Date   Genital herpes    Macular degeneration    Thyroid  disease    History reviewed. No pertinent surgical history. Patient Active Problem List   Diagnosis Date Noted   Chronic pain of left wrist 05/07/2023   Anxiety disorder 01/11/2023   Contusion of knee, left 11/09/2022   Metatarsalgia of right foot 03/06/2022   Iron deficiency 02/23/2022   Dry age-related macular degeneration 12/08/2021   Chronic cough 08/11/2021   Chronic foot pain, right 07/12/2021   Left shoulder pain 05/05/2021   Hormone replacement therapy (HRT) 07/08/2020   Corn of foot right, 4/5 interspace 06/07/2020   Herpes infection 11/19/2019   Osteoporosis 11/19/2019   Factor V Leiden (HCC) 11/19/2019   Hypothyroidism 11/19/2019   Anxiety 11/19/2019   Trigger finger of right hand 06/07/2016   Carpal tunnel syndrome, right 02/22/2016   Primary osteoarthritis of right hand 12/30/2015   Degenerative disc disease, cervical 12/30/2015   Trigger finger of left hand 10/08/2015   Pes cavus 11/21/2013   Right lumbar radiculopathy 11/21/2013    PCP: Dr Duaine German  REFERRING PROVIDER: Dr Annemarie Kil  REFERRING DIAG: R lumbar radiculopathy   Rationale for Evaluation and Treatment: Rehabilitation  THERAPY DIAG:  Radiculopathy, lumbar region  Muscle weakness (generalized)  Abnormal posture  Other  symptoms and signs involving the musculoskeletal system  ONSET DATE: 10/24/23  SUBJECTIVE:                                                                                                                                                                                           SUBJECTIVE STATEMENT: Patient reports that LB was doing well until ~ 2 weeks ago when she began to have increased pain and spasms in R > L LB. She has increased stiffness and aching in the morning.   PERTINENT HISTORY:  OA; Recurrent LBP; cervical DDD; TMJ;  trigger finger L hand; R carpal tunnel syndrome; anxiety; L shoulder pain; chronic R foot pain; chronic L wrist pain;    PAIN:  Are you having pain? Yes: NPRS scale: 6-7/10 Pain location: R > L LB Pain description: pounding aching  Aggravating factors: worse in mornings; bending forward and backward  Relieving factors: stretching; ice; heat    PRECAUTIONS: None  RED FLAGS: None   WEIGHT BEARING RESTRICTIONS: No  FALLS:  Has patient fallen in last 6 months? No  LIVING ENVIRONMENT: Lives with: lives with their spouse Lives in: House/apartment Stairs: Yes: Internal: 12-14 steps; on right going up Has following equipment at home: None  OCCUPATION: works part-time, Psychologist, occupational ; has worked Education officer, environmental; Clinical biochemist work;  Currently does household chores; cooking; exercise  PLOF: Independent  PATIENT GOALS: to be painless and stronger in trunk and back to avoid these situations again   NEXT MD VISIT: none scheduled   OBJECTIVE:  Note: Objective measures were completed at Evaluation unless otherwise noted.  DIAGNOSTIC FINDINGS:  No recent diagnostic findings   PATIENT SURVEYS:  Modified Oswestry 27/50; 54%   COGNITION: Overall cognitive status: Within functional limits for tasks assessed     SENSATION: WFL  MUSCLE LENGTH: Hamstrings: Right 80 deg; Left 80 deg  POSTURE: rounded shoulders, forward head, and increased thoracic kyphosis; L  hemipelvis hight than R in standing   PALPATION: Tightness through the R > L QL, lats, lumbar paraspinals   LUMBAR ROM:   AROM eval  Flexion 50% tight; pulling   Extension 50% tight; aching   Right lateral flexion 65% tight R LB  Left lateral flexion 60% tight   Right rotation 40% tight; pulling   Left rotation 35% tight    (Blank rows = not tested)  LOWER EXTREMITY ROM:   WNL's throughout   Active  Right eval Left eval  Hip flexion    Hip extension    Hip abduction    Hip adduction    Hip internal rotation    Hip external rotation    Knee flexion    Knee extension    Ankle dorsiflexion    Ankle plantarflexion    Ankle inversion    Ankle eversion     (Blank rows = not tested)  LOWER EXTREMITY MMT:  5/5 bilat hips, knees   MMT Right eval Left eval  Hip flexion    Hip extension    Hip abduction    Hip adduction    Hip internal rotation    Hip external rotation    Knee flexion    Knee extension    Ankle dorsiflexion    Ankle plantarflexion    Ankle inversion    Ankle eversion     (Blank rows = not tested)  LUMBAR SPECIAL TESTS:  Straight leg raise test: Negative and Slump test: Negative  FUNCTIONAL TESTS:  5 times sit to stand: 26.53 sec  SLS - 10 sec R/L no UE support   GAIT: Distance walked: 40 feet Assistive device utilized: None Level of assistance: Complete Independence Comments: WNLs   TREATMENT DATE:  Review of evaluation Education re exercises    PATIENT EDUCATION:  Education details: POC; HEP  Person educated: Patient Education method: Explanation, Demonstration, Tactile cues, Verbal cues, and Handouts Education comprehension: verbalized understanding, returned demonstration, verbal cues required, tactile cues required, and needs further education  HOME EXERCISE PROGRAM: Access Code: [email or web address] URL:  [email or web address] Date: 11/07/2023 Prepared by: Jessicalynn Deshong  Exercises - Cat Cow  - 2 x daily - 7 x weekly - 1 sets - 5-10 reps - 3-5 sec  hold - Cat Cow to Child's Pose  - 2 x daily - 7 x weekly - 1 sets - 3 reps - 30 sec  hold - Supine Transversus Abdominis Bracing with Pelvic Floor Contraction  - 2 x daily - 7 x weekly - 1 sets - 10 reps - 10sec  hold  ASSESSMENT:  CLINICAL IMPRESSION: Patient is a 68 y.o. female who was seen today for physical therapy evaluation and treatment for chronic LBP. Patient presents with c/o R > L LBP. She has good ROM and strength bilat LE's. She does have limited mobility through the lumbar spine with c/o tightness and pain with lumbar ROM. She has tightness in the lumbar spine musculature R > L through the QL, lats; lumbar paraspinals. Patient will benefit from PT to address problems identified.   OBJECTIVE IMPAIRMENTS: increased muscle spasms, impaired flexibility, improper body mechanics, postural dysfunction, and pain.   ACTIVITY LIMITATIONS: carrying, lifting, standing, and squatting  PARTICIPATION LIMITATIONS:   PERSONAL FACTORS: Past/current experiences and Time since onset of injury/illness/exacerbation are also affecting patient's functional outcome.   REHAB POTENTIAL: Good  CLINICAL DECISION MAKING: Evolving/moderate complexity  EVALUATION COMPLEXITY: Moderate   GOALS: Goals reviewed with patient? Yes  SHORT TERM GOALS: Target date: 12/05/2023  Independent in initial HEP  Baseline: Goal status: INITIAL  2.  Instruct patient in appropriate exercises for core stabilization and lumbar stretching  Baseline:  Goal status: INITIAL   LONG TERM GOALS: Target date: 01/02/2024   Decrease pain by 25-50% allowing patient to perform all functional activities independently with no significant change in baseline pain Baseline:  Goal status: INITIAL  2.  Decrease muscular tightness to palpation and increase tissue  extensibility therefore decreasing muscle spasms  Baseline:  Goal status: INITIAL  3.  Patient will be independent in HEP including aquatic program as indicated  Baseline:  Goal status: INITIAL  4.  Improve Modified Oswestry by 8-10 points  Baseline: Modified Oswestry 27/50; 54% Goal status: INITIAL    PLAN:  PT FREQUENCY: 1-2x/week  PT DURATION: 8 weeks  PLANNED INTERVENTIONS: 97164- PT Re-evaluation, 97110-Therapeutic exercises, 97530- Therapeutic activity, 97112- Neuromuscular re-education, 97535- Self Care, 40102- Manual therapy, [phone]- Gait training, Patient/Family education, Balance training, Stair training, Taping, Dry Needling, and Joint mobilization.  PLAN FOR NEXT SESSION: review and progress exercises; continue with spine care and back education; manual work and modalities as indicated    Shandi Godfrey P Ty [place], PT 11/07/2023, 1:48 PM

## 2023-11-07 ENCOUNTER — Other Ambulatory Visit: Payer: Self-pay

## 2023-11-07 ENCOUNTER — Encounter: Payer: Self-pay | Admitting: Rehabilitative and Restorative Service Providers"

## 2023-11-07 ENCOUNTER — Ambulatory Visit: Attending: Sports Medicine | Admitting: Rehabilitative and Restorative Service Providers"

## 2023-11-07 DIAGNOSIS — M5416 Radiculopathy, lumbar region: Secondary | ICD-10-CM | POA: Insufficient documentation

## 2023-11-07 DIAGNOSIS — R29898 Other symptoms and signs involving the musculoskeletal system: Secondary | ICD-10-CM | POA: Insufficient documentation

## 2023-11-07 DIAGNOSIS — R293 Abnormal posture: Secondary | ICD-10-CM | POA: Diagnosis present

## 2023-11-07 DIAGNOSIS — M6281 Muscle weakness (generalized): Secondary | ICD-10-CM | POA: Diagnosis present

## 2023-11-16 ENCOUNTER — Ambulatory Visit: Admitting: Physical Therapy

## 2023-11-16 ENCOUNTER — Encounter: Payer: Self-pay | Admitting: Physical Therapy

## 2023-11-16 DIAGNOSIS — M5416 Radiculopathy, lumbar region: Secondary | ICD-10-CM

## 2023-11-16 DIAGNOSIS — M6281 Muscle weakness (generalized): Secondary | ICD-10-CM

## 2023-11-16 NOTE — Therapy (Signed)
 OUTPATIENT PHYSICAL THERAPY THORACOLUMBAR TREATMENT   Patient Name: Sheila Harris MRN: [id number] DOB:1956/04/12, 68 y.o., female Today's Date: 11/16/2023  END OF SESSION:  PT End of Session - 11/16/23 1148     Visit Number 2    Number of Visits 16    Date for PT Re-Evaluation 01/02/24    Authorization Type [employer] $15 copay    Authorization - Visit Number 2    Progress Note Due on Visit 10    PT Start Time 1103    PT Stop Time 1148    PT Time Calculation (min) 45 min    Activity Tolerance Patient tolerated treatment well    Behavior During Therapy WFL for tasks assessed/performed              Past Medical History:  Diagnosis Date   Genital herpes    Macular degeneration    Thyroid  disease    History reviewed. No pertinent surgical history. Patient Active Problem List   Diagnosis Date Noted   Chronic pain of left wrist 05/07/2023   Anxiety disorder 01/11/2023   Contusion of knee, left 11/09/2022   Metatarsalgia of right foot 03/06/2022   Iron deficiency 02/23/2022   Dry age-related macular degeneration 12/08/2021   Chronic cough 08/11/2021   Chronic foot pain, right 07/12/2021   Left shoulder pain 05/05/2021   Hormone replacement therapy (HRT) 07/08/2020   Corn of foot right, 4/5 interspace 06/07/2020   Herpes infection 11/19/2019   Osteoporosis 11/19/2019   Factor V Leiden (HCC) 11/19/2019   Hypothyroidism 11/19/2019   Anxiety 11/19/2019   Trigger finger of right hand 06/07/2016   Carpal tunnel syndrome, right 02/22/2016   Primary osteoarthritis of right hand 12/30/2015   Degenerative disc disease, cervical 12/30/2015   Trigger finger of left hand 10/08/2015   Pes cavus 11/21/2013   Right lumbar radiculopathy 11/21/2013    PCP: Dr Duaine German  REFERRING PROVIDER: Dr Annemarie Kil  REFERRING DIAG: R lumbar radiculopathy   Rationale for Evaluation and Treatment: Rehabilitation  THERAPY DIAG:  Muscle weakness  (generalized)  Radiculopathy, lumbar region  ONSET DATE: 10/24/23  SUBJECTIVE:                                                                                                                                                                                           SUBJECTIVE STATEMENT: Patient reports she feels better than at initial eval. She has incorporated more hip strengthening to help with core and QL weakness. She states she feels she really needs to work on strengthening and she does not need dry needling today  PERTINENT HISTORY:  OA; Recurrent  LBP; cervical DDD; TMJ; trigger finger L hand; R carpal tunnel syndrome; anxiety; L shoulder pain; chronic R foot pain; chronic L wrist pain;  Patient reports that LB was doing well until ~ 2 weeks ago when she began to have increased pain and spasms in R > L LB. She has increased stiffness and aching in the morning.    PAIN:  Are you having pain? Yes: NPRS scale: 5/10 Pain location: R > L LB Pain description: pounding aching  Aggravating factors: worse in mornings; bending forward and backward  Relieving factors: stretching; ice; heat    PRECAUTIONS: None  RED FLAGS: None   WEIGHT BEARING RESTRICTIONS: No  FALLS:  Has patient fallen in last 6 months? No  LIVING ENVIRONMENT: Lives with: lives with their spouse Lives in: House/apartment Stairs: Yes: Internal: 12-14 steps; on right going up Has following equipment at home: None  OCCUPATION: works part-time, Psychologist, occupational ; has worked Education officer, environmental; Clinical biochemist work;  Currently does household chores; cooking; exercise  PLOF: Independent  PATIENT GOALS: to be painless and stronger in trunk and back to avoid these situations again   NEXT MD VISIT: none scheduled   OBJECTIVE:  Note: Objective measures were completed at Evaluation unless otherwise noted.  DIAGNOSTIC FINDINGS:  No recent diagnostic findings   PATIENT SURVEYS:  Modified Oswestry 27/50; 54%    COGNITION: Overall cognitive status: Within functional limits for tasks assessed     SENSATION: WFL  MUSCLE LENGTH: Hamstrings: Right 80 deg; Left 80 deg  POSTURE: rounded shoulders, forward head, and increased thoracic kyphosis; L hemipelvis hight than R in standing   PALPATION: Tightness through the R > L QL, lats, lumbar paraspinals   LUMBAR ROM:   AROM eval  Flexion 50% tight; pulling   Extension 50% tight; aching   Right lateral flexion 65% tight R LB  Left lateral flexion 60% tight   Right rotation 40% tight; pulling   Left rotation 35% tight    (Blank rows = not tested)  LOWER EXTREMITY ROM:   WNL's throughout   Active  Right eval Left eval  Hip flexion    Hip extension    Hip abduction    Hip adduction    Hip internal rotation    Hip external rotation    Knee flexion    Knee extension    Ankle dorsiflexion    Ankle plantarflexion    Ankle inversion    Ankle eversion     (Blank rows = not tested)  LOWER EXTREMITY MMT:  5/5 bilat hips, knees   MMT Right eval Left eval  Hip flexion    Hip extension    Hip abduction    Hip adduction    Hip internal rotation    Hip external rotation    Knee flexion    Knee extension    Ankle dorsiflexion    Ankle plantarflexion    Ankle inversion    Ankle eversion     (Blank rows = not tested)  LUMBAR SPECIAL TESTS:  Straight leg raise test: Negative and Slump test: Negative  FUNCTIONAL TESTS:  5 times sit to stand: 26.53 sec  SLS - 10 sec R/L no UE support   GAIT: Distance walked: 40 feet Assistive device utilized: None Level of assistance: Complete Independence Comments: WNLs    [facility] Adult PT Treatment:  DATE: 11/16/23 Therapeutic Exercise/Activity: Sidelying hip abd 2 x 10 Sidelying hip circles 2 x 10 CW/CCW Resisted trunk rotation with march - red power band Half kneeling trunk diagonals blue med ball x 12 bilat Dead lift black power band x  15 Review updated HEP and rationale for treatment   TREATMENT DATE:                                                                                                                                Review of evaluation Education re exercises    PATIENT EDUCATION:  Education details: POC; HEP  Person educated: Patient Education method: Programmer, multimedia, Demonstration, Tactile cues, Verbal cues, and Handouts Education comprehension: verbalized understanding, returned demonstration, verbal cues required, tactile cues required, and needs further education  HOME EXERCISE PROGRAM: Access Code: [email or web address] URL: [email or web address] Date: 11/16/2023 Prepared by: Lowery Rue  Exercises - Cat Cow  - 2 x daily - 7 x weekly - 1 sets - 5-10 reps - 3-5 sec  hold - Cat Cow to Child's Pose  - 2 x daily - 7 x weekly - 1 sets - 3 reps - 30 sec  hold - Supine Transversus Abdominis Bracing with Pelvic Floor Contraction  - 2 x daily - 7 x weekly - 1 sets - 10 reps - 10sec  hold - Half Kneeling Diagonal Lift with Narrow Balance  - 1 x daily - 7 x weekly - 2 sets - 10 reps - Deadlift with Resistance  - 1 x daily - 7 x weekly - 2 sets - 10 reps  ASSESSMENT:  CLINICAL IMPRESSION: Treatment session focused on core/QL strengthening pt was educated on updated HEP and rationale for treatment. Pt good tolerance to all activities during session    GOALS: Goals reviewed with patient? Yes  SHORT TERM GOALS: Target date: 12/05/2023  Independent in initial HEP  Baseline: Goal status: INITIAL  2.  Instruct patient in appropriate exercises for core stabilization and lumbar stretching  Baseline:  Goal status: INITIAL   LONG TERM GOALS: Target date: 01/02/2024   Decrease pain by 25-50% allowing patient to perform all functional activities independently with no significant change in baseline pain Baseline:  Goal status: INITIAL  2.  Decrease muscular tightness to palpation and increase tissue  extensibility therefore decreasing muscle spasms  Baseline:  Goal status: INITIAL  3.  Patient will be independent in HEP including aquatic program as indicated  Baseline:  Goal status: INITIAL  4.  Improve Modified Oswestry by 8-10 points  Baseline: Modified Oswestry 27/50; 54% Goal status: INITIAL    PLAN:  PT FREQUENCY: 1-2x/week  PT DURATION: 8 weeks  PLANNED INTERVENTIONS: 97164- PT Re-evaluation, 97110-Therapeutic exercises, 97530- Therapeutic activity, 97112- Neuromuscular re-education, 97535- Self Care, 11914- Manual therapy, [phone]- Gait training, Patient/Family education, Balance training, Stair training, Taping, Dry Needling, and Joint mobilization.  PLAN FOR NEXT SESSION: review and progress exercises; continue with spine care and back education; manual work  and modalities as indicated    Jada Fass, PT 11/16/2023, 11:48 AM

## 2023-11-20 ENCOUNTER — Ambulatory Visit: Attending: Sports Medicine | Admitting: Rehabilitative and Restorative Service Providers"

## 2023-11-20 DIAGNOSIS — R29898 Other symptoms and signs involving the musculoskeletal system: Secondary | ICD-10-CM | POA: Diagnosis present

## 2023-11-20 DIAGNOSIS — M6281 Muscle weakness (generalized): Secondary | ICD-10-CM | POA: Diagnosis present

## 2023-11-20 DIAGNOSIS — R293 Abnormal posture: Secondary | ICD-10-CM | POA: Diagnosis present

## 2023-11-20 DIAGNOSIS — M5416 Radiculopathy, lumbar region: Secondary | ICD-10-CM | POA: Diagnosis present

## 2023-11-20 NOTE — Therapy (Signed)
 OUTPATIENT PHYSICAL THERAPY THORACOLUMBAR TREATMENT   Patient Name: Sheila Harris MRN: [id number] DOB:06/06/1956, 68 y.o., female Today's Date: 11/20/2023  END OF SESSION:  PT End of Session - 11/20/23 1025     Visit Number 3    Number of Visits 16    Date for PT Re-Evaluation 01/02/24    Authorization Type [employer] $15 copay    Authorization - Visit Number 3    Progress Note Due on Visit 10    PT Start Time 1020    PT Stop Time 1100    PT Time Calculation (min) 40 min              Past Medical History:  Diagnosis Date   Genital herpes    Macular degeneration    Thyroid  disease    No past surgical history on file. Patient Active Problem List   Diagnosis Date Noted   Chronic pain of left wrist 05/07/2023   Anxiety disorder 01/11/2023   Contusion of knee, left 11/09/2022   Metatarsalgia of right foot 03/06/2022   Iron deficiency 02/23/2022   Dry age-related macular degeneration 12/08/2021   Chronic cough 08/11/2021   Chronic foot pain, right 07/12/2021   Left shoulder pain 05/05/2021   Hormone replacement therapy (HRT) 07/08/2020   Corn of foot right, 4/5 interspace 06/07/2020   Herpes infection 11/19/2019   Osteoporosis 11/19/2019   Factor V Leiden (HCC) 11/19/2019   Hypothyroidism 11/19/2019   Anxiety 11/19/2019   Trigger finger of right hand 06/07/2016   Carpal tunnel syndrome, right 02/22/2016   Primary osteoarthritis of right hand 12/30/2015   Degenerative disc disease, cervical 12/30/2015   Trigger finger of left hand 10/08/2015   Pes cavus 11/21/2013   Right lumbar radiculopathy 11/21/2013    PCP: Dr Duaine German  REFERRING PROVIDER: Dr Annemarie Kil  REFERRING DIAG: R lumbar radiculopathy   Rationale for Evaluation and Treatment: Rehabilitation  THERAPY DIAG:  Muscle weakness (generalized)  Radiculopathy, lumbar region  Abnormal posture  Other symptoms and signs involving the musculoskeletal system  ONSET DATE:  10/24/23  SUBJECTIVE:                                                                                                                                                                                           SUBJECTIVE STATEMENT: Patient reports she had 2.5 weeks of severe tightness in the R QL and psoas. She has worked on the tightness with tools and stretching. Feels the QL is the problems now.    PERTINENT HISTORY:  OA; Recurrent LBP; cervical DDD; TMJ; trigger finger L hand; R carpal tunnel syndrome; anxiety; L  shoulder pain; chronic R foot pain; chronic L wrist pain;  Patient reports that LB was doing well until ~ 2 weeks ago when she began to have increased pain and spasms in R > L LB. She has increased stiffness and aching in the morning.    PAIN:  Are you having pain? Yes: NPRS scale: 3/10 Pain location: R > L LB Pain description: pounding aching  Aggravating factors: worse in mornings; bending forward and backward  Relieving factors: stretching; ice; heat    PRECAUTIONS: None   WEIGHT BEARING RESTRICTIONS: No  FALLS:  Has patient fallen in last 6 months? No  LIVING ENVIRONMENT: Lives with: lives with their spouse Lives in: House/apartment Stairs: Yes: Internal: 12-14 steps; on right going up Has following equipment at home: None  OCCUPATION: works part-time, Psychologist, occupational ; has worked Education officer, environmental; Clinical biochemist work;  Currently does household chores; cooking; exercise  PATIENT GOALS: to be painless and stronger in trunk and back to avoid these situations again   NEXT MD VISIT: none scheduled   OBJECTIVE:  Note: Objective measures were completed at Evaluation unless otherwise noted.  DIAGNOSTIC FINDINGS:  No recent diagnostic findings   PATIENT SURVEYS:  Modified Oswestry 27/50; 54%    SENSATION: WFL  MUSCLE LENGTH: Hamstrings: Right 80 deg; Left 80 deg  POSTURE: rounded shoulders, forward head, and increased thoracic kyphosis; L hemipelvis hight than R in  standing   PALPATION: Tightness through the R > L QL, lats, lumbar paraspinals   LUMBAR ROM:   AROM eval  Flexion 50% tight; pulling   Extension 50% tight; aching   Right lateral flexion 65% tight R LB  Left lateral flexion 60% tight   Right rotation 40% tight; pulling   Left rotation 35% tight    (Blank rows = not tested)  LOWER EXTREMITY ROM:   WNL's throughout   Active  Right eval Left eval  Hip flexion    Hip extension    Hip abduction    Hip adduction    Hip internal rotation    Hip external rotation    Knee flexion    Knee extension    Ankle dorsiflexion    Ankle plantarflexion    Ankle inversion    Ankle eversion     (Blank rows = not tested)  LOWER EXTREMITY MMT:  5/5 bilat hips, knees   MMT Right eval Left eval  Hip flexion    Hip extension    Hip abduction    Hip adduction    Hip internal rotation    Hip external rotation    Knee flexion    Knee extension    Ankle dorsiflexion    Ankle plantarflexion    Ankle inversion    Ankle eversion     (Blank rows = not tested)  LUMBAR SPECIAL TESTS:  Straight leg raise test: Negative and Slump test: Negative  FUNCTIONAL TESTS:  5 times sit to stand: 26.53 sec  SLS - 10 sec R/L no UE support   GAIT: Distance walked: 40 feet Assistive device utilized: None Level of assistance: Complete Independence Comments: WNLs    [facility] Adult PT Treatment:                                                DATE: 11/20/23 Therapeutic Exercise/Activity: Lateral trunk stretch sitting L side over bolster/pillow 20-30 sec x  4 QL stretch in supine 30 sec x3 R/L  Hip flexor stretch supine 30 sec x 3 L  Side plank 10-15 sec x 3 R  Sidelying hip abd 2 x 10 Sidelying hip circles 2 x 10 CW/CCW Resisted trunk rotation with march - red power band Half kneeling trunk diagonals blue med ball x 12 bilat Dead lift black power band x 15 Dead lift 10 # KB x 10 Dead lift mechanics with dowel to practice for barbell she has at  home Review updated HEP and rationale for treatment  [facility] Adult PT Treatment:                                                DATE: 11/16/23 Therapeutic Exercise/Activity: Sidelying hip abd 2 x 10 Sidelying hip circles 2 x 10 CW/CCW Resisted trunk rotation with march - red power band Half kneeling trunk diagonals blue med ball x 12 bilat Dead lift black power band x 15 Review updated HEP and rationale for treatment   TREATMENT DATE:                                                                                                                                Review of evaluation Education re exercises    PATIENT EDUCATION:  Education details: POC; HEP  Person educated: Patient Education method: Programmer, multimedia, Demonstration, Actor cues, Verbal cues, and Handouts Education comprehension: verbalized understanding, returned demonstration, verbal cues required, tactile cues required, and needs further education  HOME EXERCISE PROGRAM: Access Code: [email or web address] URL: [email or web address] Date: 11/16/2023 Prepared by: Lowery Rue  Exercises - Cat Cow  - 2 x daily - 7 x weekly - 1 sets - 5-10 reps - 3-5 sec  hold - Cat Cow to Child's Pose  - 2 x daily - 7 x weekly - 1 sets - 3 reps - 30 sec  hold - Supine Transversus Abdominis Bracing with Pelvic Floor Contraction  - 2 x daily - 7 x weekly - 1 sets - 10 reps - 10sec  hold - Half Kneeling Diagonal Lift with Narrow Balance  - 1 x daily - 7 x weekly - 2 sets - 10 reps - Deadlift with Resistance  - 1 x daily - 7 x weekly - 2 sets - 10 reps  ASSESSMENT:  CLINICAL IMPRESSION: Treatment session focused on stretching and core/QL strengthening. Patient has an extensive list of exercises and is exercising in the gym as well as at home.  Reviewed and updated HEP and rationale for treatment. Pt good tolerance to all activities during session    GOALS: Goals reviewed with patient? Yes  SHORT TERM GOALS: Target date:  12/05/2023  Independent in initial HEP  Baseline: Goal status: INITIAL  2.  Instruct patient in appropriate exercises for core stabilization and lumbar stretching  Baseline:  Goal status: INITIAL   LONG TERM GOALS: Target date: 01/02/2024   Decrease pain by 25-50% allowing patient to perform all functional activities independently with no significant change in baseline pain Baseline:  Goal status: INITIAL  2.  Decrease muscular tightness to palpation and increase tissue extensibility therefore decreasing muscle spasms  Baseline:  Goal status: INITIAL  3.  Patient will be independent in HEP including aquatic program as indicated  Baseline:  Goal status: INITIAL  4.  Improve Modified Oswestry by 8-10 points  Baseline: Modified Oswestry 27/50; 54% Goal status: INITIAL    PLAN:  PT FREQUENCY: 1-2x/week  PT DURATION: 8 weeks  PLANNED INTERVENTIONS: 97164- PT Re-evaluation, 97110-Therapeutic exercises, 97530- Therapeutic activity, 97112- Neuromuscular re-education, 97535- Self Care, 16109- Manual therapy, [phone]- Gait training, Patient/Family education, Balance training, Stair training, Taping, Dry Needling, and Joint mobilization.  PLAN FOR NEXT SESSION: review and progress exercises; continue with spine care and back education; manual work and modalities as indicated    Allah Reason P Sheikh Leverich, PT 11/20/2023, 10:27 AM

## 2023-11-22 ENCOUNTER — Ambulatory Visit: Admitting: Physical Therapy

## 2023-11-27 ENCOUNTER — Encounter: Admitting: Rehabilitative and Restorative Service Providers"

## 2023-11-28 NOTE — Therapy (Signed)
 OUTPATIENT PHYSICAL THERAPY THORACOLUMBAR TREATMENT   Patient Name: Sheila Harris MRN: [id number] DOB:February 10, 1956, 68 y.o., female Today's Date: 11/29/2023  END OF SESSION:  PT End of Session - 11/29/23 1107     Visit Number 4    Number of Visits 16    Date for PT Re-Evaluation 01/02/24    Authorization Type [employer] $15 copay    Authorization - Visit Number 4    Progress Note Due on Visit 10    PT Start Time 1107    PT Stop Time 1146    PT Time Calculation (min) 39 min    Activity Tolerance Patient tolerated treatment well    Behavior During Therapy WFL for tasks assessed/performed            Past Medical History:  Diagnosis Date   Genital herpes    Macular degeneration    Thyroid  disease    History reviewed. No pertinent surgical history. Patient Active Problem List   Diagnosis Date Noted   Chronic pain of left wrist 05/07/2023   Anxiety disorder 01/11/2023   Contusion of knee, left 11/09/2022   Metatarsalgia of right foot 03/06/2022   Iron deficiency 02/23/2022   Dry age-related macular degeneration 12/08/2021   Chronic cough 08/11/2021   Chronic foot pain, right 07/12/2021   Left shoulder pain 05/05/2021   Hormone replacement therapy (HRT) 07/08/2020   Corn of foot right, 4/5 interspace 06/07/2020   Herpes infection 11/19/2019   Osteoporosis 11/19/2019   Factor V Leiden (HCC) 11/19/2019   Hypothyroidism 11/19/2019   Anxiety 11/19/2019   Trigger finger of right hand 06/07/2016   Carpal tunnel syndrome, right 02/22/2016   Primary osteoarthritis of right hand 12/30/2015   Degenerative disc disease, cervical 12/30/2015   Trigger finger of left hand 10/08/2015   Pes cavus 11/21/2013   Right lumbar radiculopathy 11/21/2013    PCP: Dr Duaine German  REFERRING PROVIDER: Dr Annemarie Kil  REFERRING DIAG: R lumbar radiculopathy   Rationale for Evaluation and Treatment: Rehabilitation  THERAPY DIAG:  Muscle weakness  (generalized)  Radiculopathy, lumbar region  Abnormal posture  Other symptoms and signs involving the musculoskeletal system  ONSET DATE: 10/24/23  SUBJECTIVE:                                                                                                                                                                                           SUBJECTIVE STATEMENT: It's been better.   PERTINENT HISTORY:  OA; Recurrent LBP; cervical DDD; TMJ; trigger finger L hand; R carpal tunnel syndrome; anxiety; L shoulder pain; chronic R foot pain; chronic L wrist pain;  Patient reports  that LB was doing well until ~ 2 weeks ago when she began to have increased pain and spasms in R > L LB. She has increased stiffness and aching in the morning.    PAIN:  Are you having pain? Yes: NPRS scale: 0/10 Pain location: R > L LB Pain description: pounding aching  Aggravating factors: worse in mornings; bending forward and backward  Relieving factors: stretching; ice; heat    PRECAUTIONS: None   WEIGHT BEARING RESTRICTIONS: No  FALLS:  Has patient fallen in last 6 months? No  LIVING ENVIRONMENT: Lives with: lives with their spouse Lives in: House/apartment Stairs: Yes: Internal: 12-14 steps; on right going up Has following equipment at home: None  OCCUPATION: works part-time, Psychologist, occupational ; has worked Education officer, environmental; Clinical biochemist work;  Currently does household chores; cooking; exercise  PATIENT GOALS: to be painless and stronger in trunk and back to avoid these situations again   NEXT MD VISIT: none scheduled   OBJECTIVE:  Note: Objective measures were completed at Evaluation unless otherwise noted.  DIAGNOSTIC FINDINGS:  No recent diagnostic findings   PATIENT SURVEYS:  Modified Oswestry 27/50; 54%    SENSATION: WFL  MUSCLE LENGTH: Hamstrings: Right 80 deg; Left 80 deg  POSTURE: rounded shoulders, forward head, and increased thoracic kyphosis; L hemipelvis hight than R in  standing   PALPATION: Tightness through the R > L QL, lats, lumbar paraspinals   LUMBAR ROM:   AROM eval  Flexion 50% tight; pulling   Extension 50% tight; aching   Right lateral flexion 65% tight R LB  Left lateral flexion 60% tight   Right rotation 40% tight; pulling   Left rotation 35% tight    (Blank rows = not tested)  LOWER EXTREMITY ROM:   WNL's throughout   Active  Right eval Left eval  Hip flexion    Hip extension    Hip abduction    Hip adduction    Hip internal rotation    Hip external rotation    Knee flexion    Knee extension    Ankle dorsiflexion    Ankle plantarflexion    Ankle inversion    Ankle eversion     (Blank rows = not tested)  LOWER EXTREMITY MMT:  5/5 bilat hips, knees   MMT Right eval Left eval  Hip flexion    Hip extension    Hip abduction    Hip adduction    Hip internal rotation    Hip external rotation    Knee flexion    Knee extension    Ankle dorsiflexion    Ankle plantarflexion    Ankle inversion    Ankle eversion     (Blank rows = not tested)  LUMBAR SPECIAL TESTS:  Straight leg raise test: Negative and Slump test: Negative  FUNCTIONAL TESTS:  5 times sit to stand: 26.53 sec  SLS - 10 sec R/L no UE support   GAIT: Distance walked: 40 feet Assistive device utilized: None Level of assistance: Complete Independence Comments: WNLs   [facility] Adult PT Treatment:                                                DATE: 11/29/23 Therapeutic Exercise/Activity: Warrior stretch with SB 30 R/L  Side plank 10-15 sec x 3 R  Sidelying hip abd 2 x 10 Sidelying hip circles 2  x 10 CW/CCW Pallof press with red power band x 10 B Lat pull from high anchor in split stance 2x10 Dead lift 10 # KB 2x 10 Bear plank  Bridge with alt knee ext  and fig 4 bridge as review   [facility] Adult PT Treatment:                                                DATE: 11/20/23 Therapeutic Exercise/Activity: Lateral trunk stretch sitting L side over  bolster/pillow 20-30 sec x 4 QL stretch in supine 30 sec x3 R/L  Hip flexor stretch supine 30 sec x 3 L  Side plank 10-15 sec x 3 R  Sidelying hip abd 2 x 10 Sidelying hip circles 2 x 10 CW/CCW Resisted trunk rotation with march - red power band Half kneeling trunk diagonals blue med ball x 12 bilat Dead lift black power band x 15 Dead lift 10 # KB x 10 Dead lift mechanics with dowel to practice for barbell she has at home Review updated HEP and rationale for treatment  [facility] Adult PT Treatment:                                                DATE: 11/16/23 Therapeutic Exercise/Activity: Sidelying hip abd 2 x 10 Sidelying hip circles 2 x 10 CW/CCW Resisted trunk rotation with march - red power band Half kneeling trunk diagonals blue med ball x 12 bilat Dead lift black power band x 15 Review updated HEP and rationale for treatment   TREATMENT DATE:                                                                                                                                Review of evaluation Education re exercises    PATIENT EDUCATION:  Education details: POC; HEP  Person educated: Patient Education method: Programmer, multimedia, Demonstration, Actor cues, Verbal cues, and Handouts Education comprehension: verbalized understanding, returned demonstration, verbal cues required, tactile cues required, and needs further education  HOME EXERCISE PROGRAM: Access Code: [email or web address] URL: [email or web address] Date: 11/16/2023 Prepared by: Lowery Rue  Exercises - Cat Cow  - 2 x daily - 7 x weekly - 1 sets - 5-10 reps - 3-5 sec  hold - Cat Cow to Child's Pose  - 2 x daily - 7 x weekly - 1 sets - 3 reps - 30 sec  hold - Supine Transversus Abdominis Bracing with Pelvic Floor Contraction  - 2 x daily - 7 x weekly - 1 sets - 10 reps - 10sec  hold - Half Kneeling Diagonal Lift with Narrow Balance  - 1 x daily - 7 x weekly - 2 sets -  10 reps - Deadlift with Resistance  - 1 x daily  - 7 x weekly - 2 sets - 10 reps  ASSESSMENT:  CLINICAL IMPRESSION: Patient has met 3/4 LTGs and feels she is about ready for discharge. She is a little concerned that her back may flare up again, so the plan is to put her on hold for 30 days and then D/C if she does not return. She is independent with her HEP and pleased with her current level of function.    GOALS: Goals reviewed with patient? Yes  SHORT TERM GOALS: Target date: 12/05/2023  Independent in initial HEP  Baseline: Goal status: MET  2.  Instruct patient in appropriate exercises for core stabilization and lumbar stretching  Baseline:  Goal status: MET   LONG TERM GOALS: Target date: 01/02/2024   Decrease pain by 25-50% allowing patient to perform all functional activities independently with no significant change in baseline pain Baseline:  Goal status: MET  2.  Decrease muscular tightness to palpation and increase tissue extensibility therefore decreasing muscle spasms  Baseline:  Goal status: PARTIALLY MET  3.  Patient will be independent in HEP including aquatic program as indicated  Baseline:  Goal status: DEFERRED  4.  Improve Modified Oswestry by 8-10 points  Baseline: Modified Oswestry 27/50; 54%; 11/29/23  6 / 50 = 12.0 % Goal status: MET    PLAN:  PT FREQUENCY: 1-2x/week  PT DURATION: 8 weeks  PLANNED INTERVENTIONS: 97164- PT Re-evaluation, 97110-Therapeutic exercises, 97530- Therapeutic activity, 97112- Neuromuscular re-education, 97535- Self Care, 32440- Manual therapy, [phone]- Gait training, Patient/Family education, Balance training, Stair training, Taping, Dry Needling, and Joint mobilization.  PLAN FOR NEXT SESSION:  on hold until 7/11. D/C if pt does not return.   Jinx Mourning, PT 11/29/23 12:44 PM

## 2023-11-29 ENCOUNTER — Ambulatory Visit: Admitting: Physical Therapy

## 2023-11-29 ENCOUNTER — Encounter: Payer: Self-pay | Admitting: Physical Therapy

## 2023-11-29 DIAGNOSIS — R29898 Other symptoms and signs involving the musculoskeletal system: Secondary | ICD-10-CM

## 2023-11-29 DIAGNOSIS — M5416 Radiculopathy, lumbar region: Secondary | ICD-10-CM

## 2023-11-29 DIAGNOSIS — R293 Abnormal posture: Secondary | ICD-10-CM

## 2023-11-29 DIAGNOSIS — M6281 Muscle weakness (generalized): Secondary | ICD-10-CM

## 2023-12-03 ENCOUNTER — Encounter: Payer: Self-pay | Admitting: Family Medicine

## 2023-12-03 DIAGNOSIS — Z79890 Hormone replacement therapy (postmenopausal): Secondary | ICD-10-CM

## 2023-12-03 MED ORDER — AMBULATORY NON FORMULARY MEDICATION
5 refills | Status: AC
Start: 2023-12-03 — End: ?

## 2023-12-03 NOTE — Telephone Encounter (Signed)
 Requesting rx rf of Estradiol  cream Last written 04/17/2022 Last OV 07/17/2023 Upcoming appt 12/17/2023

## 2023-12-03 NOTE — Telephone Encounter (Signed)
 Printed and signed and placed in basket

## 2023-12-03 NOTE — Telephone Encounter (Signed)
Rx has been faxed to [facility]

## 2023-12-16 ENCOUNTER — Other Ambulatory Visit: Payer: Self-pay | Admitting: Family Medicine

## 2023-12-17 ENCOUNTER — Encounter: Payer: Self-pay | Admitting: Family Medicine

## 2023-12-17 ENCOUNTER — Ambulatory Visit (INDEPENDENT_AMBULATORY_CARE_PROVIDER_SITE_OTHER): Admitting: Family Medicine

## 2023-12-17 ENCOUNTER — Other Ambulatory Visit: Payer: Self-pay | Admitting: Family Medicine

## 2023-12-17 VITALS — BP 118/63 | HR 74 | Ht 66.0 in | Wt 140.1 lb

## 2023-12-17 DIAGNOSIS — L989 Disorder of the skin and subcutaneous tissue, unspecified: Secondary | ICD-10-CM

## 2023-12-17 DIAGNOSIS — N393 Stress incontinence (female) (male): Secondary | ICD-10-CM | POA: Insufficient documentation

## 2023-12-17 DIAGNOSIS — C44529 Squamous cell carcinoma of skin of other part of trunk: Secondary | ICD-10-CM | POA: Diagnosis not present

## 2023-12-17 NOTE — Patient Instructions (Addendum)
 Wound care: Keep covered until tomorrow morning if you can okay to remove the bandage at that point in time.  Okay to get wet in the shower with soap and water but do not scrub directly at the area just let it rinse.   Pat dry out of the shower and apply a small dab of Vaseline.   Starting tomorrow just put a little loose Band-Aid over it that can breathe but just keep the Vaseline from getting on your clothing.   Keep covered for the next 3 days loosely.   After that then just apply Vaseline without a bandage.   Make sure applying Vaseline twice a day for 2 to 3 weeks If at any point you are concerned about the wound healing please let us  know.  Did send the specimen for pathology and we will let you know as soon as we get that report back.

## 2023-12-17 NOTE — Progress Notes (Signed)
 Acute Office Visit  Subjective:     Patient ID: Sheila Harris, female    DOB: 08-05-1955, 68 y.o.   MRN: [id number]  No chief complaint on file.   HPI Patient is in today for skin l lesion on her right mid back.  She says it has been there for at least a couple of months.  She did see her dermatologist earlier this year and they did not see anything at that time.  She has been using a roller on her back to help with her muscles and wonders if maybe something got irritated she says the crustiness will kind of peel at times but then it always comes back and it has bled a little bit.  He also reports some stress incontinence.  He notices it particularly when she coughs or sometimes even laughs.  ROS      Objective:    BP 118/63   Pulse 74   Ht 5' 6 (1.676 m)   Wt 140 lb 1.3 oz (63.5 kg)   LMP 04/03/2010   SpO2 99%   BMI 22.61 kg/m     Physical Exam Vitals and nursing note reviewed.  Constitutional:      Appearance: Normal appearance.  HENT:     Head: Normocephalic and atraumatic.   Cardiovascular:     Rate and Rhythm: Normal rate.  Pulmonary:     Effort: Pulmonary effort is normal.   Skin:    General: Skin is warm and dry.   Neurological:     Mental Status: She is alert.   Psychiatric:        Mood and Affect: Mood normal.     Hyperkeratotic skin lesion that measures approximately 6 x 10 mm.     No results found for any visits on 12/17/23.      Assessment & Plan:   Problem List Items Addressed This Visit       Other   Stress incontinence   Given handout with video link to Kegel exercises.  She would also like referral for pelvic PT to go for at least a couple of sessions just to make sure that she is doing the techniques properly.      Relevant Orders   Ambulatory referral to Physical Therapy   Other Visit Diagnoses       Skin lesion of back    -  Primary   Relevant Orders   Surgical pathology      Skin lesion  - removed with shave  biopsy concerning for possible squamous versus just to have a hyperkeratotic lesion that may just be inflamed from trauma.  Will send biopsy to pathology.  Follow-up wound care discussed.  Call if any problems or concerns in regards to wound healing.   Shave Biopsy Procedure Note  Pre-operative Diagnosis: Suspicious lesion  Post-operative Diagnosis: same  Locations:Right midback   Indications: bleeding, getting larger  Anesthesia: Lidocaine 1% without epinephrine without added sodium bicarbonate  Procedure Details   Patient informed of the risks (including bleeding and infection) and benefits of the  procedure and Verbal informed consent obtained.  The lesion and surrounding area were given a sterile prep using chlorhexidine and draped in the usual sterile fashion. A scalpel was used to shave an area of skin approximately 6mm by 10mm.  Hemostasis achieved with alumuninum chloride. Antibiotic ointment and a sterile dressing applied.  The specimen was sent for pathologic examination. The patient tolerated the procedure well.  EBL: trace  Findings: Await  patho  Condition: Stable  Complications: none.  Plan: 1. Instructed to keep the wound dry and covered for 24-48h and clean thereafter. 2. Warning signs of infection were reviewed.   3. Recommended that the patient use OTC acetaminophen as needed for pain.  4. Return PRN  No orders of the defined types were placed in this encounter.   No follow-ups on file.  Dorothyann Byars, MD

## 2023-12-17 NOTE — Assessment & Plan Note (Signed)
 Given handout with video link to Kegel exercises.  She would also like referral for pelvic PT to go for at least a couple of sessions just to make sure that she is doing the techniques properly.

## 2023-12-18 ENCOUNTER — Ambulatory Visit: Payer: Self-pay | Admitting: Family Medicine

## 2023-12-18 ENCOUNTER — Encounter: Payer: Self-pay | Admitting: Family Medicine

## 2023-12-18 DIAGNOSIS — D045 Carcinoma in situ of skin of trunk: Secondary | ICD-10-CM

## 2023-12-18 DIAGNOSIS — C44529 Squamous cell carcinoma of skin of other part of trunk: Secondary | ICD-10-CM

## 2023-12-18 LAB — DERMATOLOGY PATHOLOGY

## 2023-12-18 NOTE — Progress Notes (Signed)
 Hi Sheila Harris, your skin less is a skin cancer called a squamous cell. These can be treated.   So I would like to refer you to Dermatology. Do you have a preference for provider for derm?

## 2023-12-19 ENCOUNTER — Telehealth: Payer: Self-pay | Admitting: Family Medicine

## 2023-12-19 ENCOUNTER — Other Ambulatory Visit

## 2023-12-19 ENCOUNTER — Encounter: Payer: Self-pay | Admitting: Family Medicine

## 2023-12-19 DIAGNOSIS — M818 Other osteoporosis without current pathological fracture: Secondary | ICD-10-CM

## 2023-12-19 DIAGNOSIS — L814 Other melanin hyperpigmentation: Secondary | ICD-10-CM | POA: Diagnosis not present

## 2023-12-19 DIAGNOSIS — M810 Age-related osteoporosis without current pathological fracture: Secondary | ICD-10-CM | POA: Diagnosis not present

## 2023-12-19 DIAGNOSIS — C44529 Squamous cell carcinoma of skin of other part of trunk: Secondary | ICD-10-CM | POA: Diagnosis not present

## 2023-12-19 DIAGNOSIS — Z780 Asymptomatic menopausal state: Secondary | ICD-10-CM | POA: Diagnosis not present

## 2023-12-19 NOTE — Telephone Encounter (Signed)
Orders Placed This Encounter  Procedures  . Ambulatory referral to Dermatology    Referral Priority:   Routine    Referral Type:   Consultation    Referral Reason:   Specialty Services Required    Requested Specialty:   Dermatology    Number of Visits Requested:   1   

## 2023-12-19 NOTE — Telephone Encounter (Signed)
 Pt is requesting that her biopsy result be faxed ASAP to [phone] for skin surgery.

## 2023-12-19 NOTE — Telephone Encounter (Signed)
 Copied from CRM [id number] [id number]. Topic: General - Other >> Dec 19, 2023  9:48 AM Zane F wrote: Reason for CRM:   Patient is calling in to request that the results from her skin biopsy be faxed over to the facility listed below as soon as possible. Patient is due to be seen at their facility at Today December 19, 2023 at 2:10 pm.   [facility] [address] [place], [place] [address]  Phone: [phone] Fax Number: [phone]

## 2023-12-20 DIAGNOSIS — M25571 Pain in right ankle and joints of right foot: Secondary | ICD-10-CM | POA: Diagnosis not present

## 2023-12-20 DIAGNOSIS — M7741 Metatarsalgia, right foot: Secondary | ICD-10-CM | POA: Diagnosis not present

## 2023-12-20 DIAGNOSIS — B070 Plantar wart: Secondary | ICD-10-CM | POA: Diagnosis not present

## 2023-12-20 DIAGNOSIS — Q667 Congenital pes cavus, unspecified foot: Secondary | ICD-10-CM | POA: Diagnosis not present

## 2023-12-24 DIAGNOSIS — H2513 Age-related nuclear cataract, bilateral: Secondary | ICD-10-CM | POA: Diagnosis not present

## 2023-12-24 DIAGNOSIS — H353132 Nonexudative age-related macular degeneration, bilateral, intermediate dry stage: Secondary | ICD-10-CM | POA: Diagnosis not present

## 2023-12-24 DIAGNOSIS — H35723 Serous detachment of retinal pigment epithelium, bilateral: Secondary | ICD-10-CM | POA: Diagnosis not present

## 2023-12-24 DIAGNOSIS — H35363 Drusen (degenerative) of macula, bilateral: Secondary | ICD-10-CM | POA: Diagnosis not present

## 2023-12-25 ENCOUNTER — Other Ambulatory Visit: Payer: Self-pay

## 2023-12-25 ENCOUNTER — Ambulatory Visit: Attending: Family Medicine

## 2023-12-25 DIAGNOSIS — N393 Stress incontinence (female) (male): Secondary | ICD-10-CM | POA: Diagnosis not present

## 2023-12-25 DIAGNOSIS — M6281 Muscle weakness (generalized): Secondary | ICD-10-CM | POA: Diagnosis not present

## 2023-12-25 DIAGNOSIS — R293 Abnormal posture: Secondary | ICD-10-CM | POA: Insufficient documentation

## 2023-12-25 DIAGNOSIS — R279 Unspecified lack of coordination: Secondary | ICD-10-CM | POA: Insufficient documentation

## 2023-12-25 NOTE — Patient Instructions (Signed)
Urge Incontinence  Ideal urination frequency is every 2-4 wakeful hours, which equates to 5-8 times within a 24-hour period.   Urge incontinence is leakage that occurs when the bladder muscle contracts, creating a sudden need to go before getting to the bathroom.   Going too often when your bladder isn't actually full can disrupt the body's automatic signals to store and hold urine longer, which will increase urgency/frequency.  In this case, the bladder "is running the show" and strategies can be learned to retrain this pattern.   One should be able to control the first urge to urinate, at around 150mL.  The bladder can hold up to a "grande latte," or 400mL. To help you gain control, practice the Urge Drill below when urgency strikes.  This drill will help retrain your bladder signals and allow you to store and hold urine longer.  The overall goal is to stretch out your time between voids to reach a more manageable voiding schedule.    Practice your "quick flicks" often throughout the day (each waking hour) even when you don't need feel the urge to go.  This will help strengthen your pelvic floor muscles, making them more effective in controlling leakage.  Urge Drill  When you feel an urge to go, follow these steps to regain control: Stop what you are doing and be still Take one deep breath, directing your air into your abdomen Think an affirming thought, such as "I've got this." Do 5 quick flicks of your pelvic floor Walk with control to the bathroom to void, or delay voiding        The knack: Use this technique while coughing, laughing, sneezing, or with any activities that causes you to leak urine a little. Right before you perform one of these activities that increase pressure in the abdomen and pushes a little urine out, perform a pelvic floor muscle contraction and hold. If that does not completely stop the leaking, try tightening your thighs together in addition to performing a  pelvic floor muscle contraction. Make sure you are not trying to stifle a cough, sneeze, or laugh; allow these activities in full as it will cause less pressure down into the bladder and pelvic floor muscles.       [facility] [address], [address] [place], [place] [address] Phone # [phone] Fax [phone]  

## 2023-12-25 NOTE — Therapy (Signed)
 OUTPATIENT PHYSICAL THERAPY FEMALE PELVIC EVALUATION   Patient Name: Sheila Harris MRN: [id number] DOB:19-Aug-1955, 68 y.o., female Today's Date: 12/25/2023  END OF SESSION:  PT End of Session - 12/25/23 1110     Visit Number 5    Date for PT Re-Evaluation 03/18/24    Authorization Type [employer] $15 copay    Progress Note Due on Visit 10    PT Start Time 1108    PT Stop Time 1143    PT Time Calculation (min) 35 min    Activity Tolerance Patient tolerated treatment well    Behavior During Therapy WFL for tasks assessed/performed          Past Medical History:  Diagnosis Date   Genital herpes    Macular degeneration    Thyroid  disease    History reviewed. No pertinent surgical history. Patient Active Problem List   Diagnosis Date Noted   Stress incontinence 12/17/2023   Chronic pain of left wrist 05/07/2023   Anxiety disorder 01/11/2023   Contusion of knee, left 11/09/2022   Metatarsalgia of right foot 03/06/2022   Iron deficiency 02/23/2022   Dry age-related macular degeneration 12/08/2021   Chronic cough 08/11/2021   Chronic foot pain, right 07/12/2021   Left shoulder pain 05/05/2021   Hormone replacement therapy (HRT) 07/08/2020   Corn of foot right, 4/5 interspace 06/07/2020   Herpes infection 11/19/2019   Osteoporosis 11/19/2019   Factor V Leiden (HCC) 11/19/2019   Hypothyroidism 11/19/2019   Anxiety 11/19/2019   Trigger finger of right hand 06/07/2016   Carpal tunnel syndrome, right 02/22/2016   Primary osteoarthritis of right hand 12/30/2015   Degenerative disc disease, cervical 12/30/2015   Trigger finger of left hand 10/08/2015   Pes cavus 11/21/2013   Right lumbar radiculopathy 11/21/2013    PCP: Alvan Dorothyann BIRCH, MD  REFERRING PROVIDER: Alvan Dorothyann BIRCH, MD  REFERRING DIAG: N39.3 (ICD-10-CM) - Stress incontinence  THERAPY DIAG:  Muscle weakness (generalized)  Abnormal posture  Unspecified lack of  coordination  Rationale for Evaluation and Treatment: Rehabilitation  ONSET DATE: 03/2023  SUBJECTIVE:                                                                                                                                                                                           SUBJECTIVE STATEMENT: Pt is having occasional stress urinary incontinence. She feels like this started after she had COVID.   PAIN:  Are you having pain? No   PRECAUTIONS: None  RED FLAGS: None   WEIGHT BEARING RESTRICTIONS: No  FALLS:  Has patient fallen in last 6 months? 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  ACTIVITY LEVEL : gym 3x/week, frequent PT exercises, walks dog every dog 1-2 miles   PLOF: Independent  PATIENT GOALS: avoid   PERTINENT HISTORY:  Genital herpes, Rt lumbar radiculopathy, osteoporosis, HRT Sexual abuse: No  BOWEL MOVEMENT: Pain with bowel movement: No Type of bowel movement:Frequency 1x/day and Strain no Fully empty rectum: Yes:   Leakage: No Pads: No Fiber supplement/laxative No  URINATION: Pain with urination: No Fully empty bladder: Yes:   Stream: Strong Urgency: Yes  Frequency: every 2 hours; 1x.night Fluid Intake: about a gallon of water a day Leakage: Coughing, Sneezing, and Laughing Pads: No  INTERCOURSE:  Not sexually active, some history of pain in the past (10 years ago)  PREGNANCY: Vaginal deliveries 1 Tearing No Episiotomy No C-section deliveries 0 Currently pregnant No  PROLAPSE: None   OBJECTIVE:  Note: Objective measures were completed at Evaluation unless otherwise noted.  12/25/23: PATIENT SURVEYS:   PFIQ-7: 24  COGNITION: Overall cognitive status: Within functional limits for tasks assessed     SENSATION: Light touch: Appears intact   FUNCTIONAL TESTS:  Squat: Rt weight shift Single leg stance:  Rt: notable pelvic drop  Lt: initially level, but could not maintain after 5  seconds Curl-up test: small upper abdominal distortion and felt like she had to support head   GAIT: Assistive device utilized: NONE Comments: WNL  POSTURE: decreased lumbar lordosis, decreased thoracic kyphosis, anterior pelvic tilt, and Rt posterior pelvic rotation, small Lt thoracic/Lt lumbar scoliosis   LUMBARAROM/PROM:  A/PROM A/PROM  Eval (% available)  Flexion 50  Extension 50  Right lateral flexion 50  Left lateral flexion 50  Right rotation 50  Left rotation 50   (Blank rows = not tested)  PALPATION:   General: tightness throughout Rt lower quadrant and glutes; gluteal clenching  Pelvic Alignment: Rt posterior rotation; overall anterior pelvic tilt with gluteal clenching  Abdominal: WNL                External Perineal Exam: some palor in upper vestibule with white patches                             Internal Pelvic Floor: atrophy noticeable, no tenderness, good moisture levels  Patient confirms identification and approves PT to assess internal pelvic floor and treatment Yes  PELVIC MMT:   MMT eval  Vaginal 2/5, 4 second hold, 3 repeat contractions  Diastasis Recti 1-2 finger width separation  (Blank rows = not tested)        TONE: low  PROLAPSE: Small grade 1 anterior vaginal wall alxity  TODAY'S TREATMENT:                                                                                                                              DATE:  12/25/23 EVAL  Neuromuscular re-education: Pt provides verbal consent for internal vaginal/rectal pelvic floor exam.  Internal vaginal pelvic floor muscle contraction training Quick flicks Long holds Urge drill The knack   PATIENT EDUCATION:  Education details: See above Person educated: Patient Education method: Explanation, Demonstration, Tactile cues, Verbal cues, and Handouts Education comprehension: verbalized understanding  HOME EXERCISE PROGRAM: [id number]  ASSESSMENT:  CLINICAL IMPRESSION: Patient  is a 68 y.o. female who was seen today for physical therapy evaluation and treatment for stress urinary continence with coughing, laughing, and sneezing. Exam findings notable for decreased lumbar A/ROM, abnormal posture, core weakness and pelvic drop in single leg stance, abnormal squat form, upper abdominal distortion with curl-up test, pelvic floor muscle weakness and decreased endurance, pelvic floor muscle atrophy without tenderness, and small amount of anterior vaginal wall laxity. Signs and symptoms are most consistent with pelvic floor muscle weakness; she likely will make very quick progress with her high level of function. Initial treatment consisted of pelvic floor muscle contraction training with breath coordination, urge drill, and the knack. She will continue to benefit from skilled PT intervention in order to decrease stress urinary incontinence, address impairments, and progress functional strengthening program.   OBJECTIVE IMPAIRMENTS: decreased activity tolerance, decreased coordination, decreased endurance, decreased mobility, decreased ROM, decreased strength, increased fascial restrictions, increased muscle spasms, impaired flexibility, impaired tone, improper body mechanics, postural dysfunction, and pain.   ACTIVITY LIMITATIONS: squatting and continence  PARTICIPATION LIMITATIONS: community activity  PERSONAL FACTORS: 1 comorbidity: medical history are also affecting patient's functional outcome.   REHAB POTENTIAL: Good  CLINICAL DECISION MAKING: Stable/uncomplicated  EVALUATION COMPLEXITY: Low   GOALS: Goals reviewed with patient? Yes  SHORT TERM GOALS: Target date: 01/22/2024   Pt will be independent with HEP.   Baseline: Goal status: INITIAL  2.  Pt will report 25% improvement in urinary incontinence in order to improve confidence with community activity.  Baseline:  Goal status: INITIAL  3.  Pt will be independent with the knack and urge suppression technique  in order to improve bladder habits and decrease urinary incontinence.   Baseline:  Goal status: INITIAL  4.  Pt will increase pelvic floor muscle strength to 3/5 in order to decrease urinary incontinence with laughing, coughing, and sneezing.  Baseline:  Goal status: INITIAL   LONG TERM GOALS: Target date: 03/18/2024  Pt will be independent with advanced HEP.   Baseline:  Goal status: INITIAL  2.  Pt will report 75% improvement in urinary incontinence in order to improve confidence with community activity.  Baseline:  Goal status: INITIAL  3.  Pt will be able to go 2-3 hours in between voids without urgency or incontinence in order to improve QOL and perform all functional activities with less difficulty.   Baseline:  Goal status: INITIAL  4.  Pt will increase pelvic floor muscle endurance to >10 seconds in order to decrease urinary incontinence with laughing, coughing, and sneezing.  Baseline:  Goal status: INITIAL  PLAN:  PT FREQUENCY: 1-2x/week  PT DURATION: 12 weeks    PLANNED INTERVENTIONS: 97110-Therapeutic exercises, 97530- Therapeutic activity, 97112- Neuromuscular re-education, 97535- Self Care, 02859- Manual therapy, Dry Needling, and Biofeedback  PLAN FOR NEXT SESSION: progress pelvic floor muscle strengthening into functional activities  Josette Mares, PT, DPT07/01/2510:44 AM

## 2023-12-26 ENCOUNTER — Ambulatory Visit: Admitting: Family Medicine

## 2023-12-28 ENCOUNTER — Encounter: Payer: Self-pay | Admitting: Family Medicine

## 2023-12-28 DIAGNOSIS — R531 Weakness: Secondary | ICD-10-CM | POA: Diagnosis not present

## 2023-12-28 DIAGNOSIS — M9905 Segmental and somatic dysfunction of pelvic region: Secondary | ICD-10-CM | POA: Diagnosis not present

## 2023-12-28 DIAGNOSIS — M25551 Pain in right hip: Secondary | ICD-10-CM | POA: Diagnosis not present

## 2023-12-28 DIAGNOSIS — M9904 Segmental and somatic dysfunction of sacral region: Secondary | ICD-10-CM | POA: Diagnosis not present

## 2023-12-28 DIAGNOSIS — M25552 Pain in left hip: Secondary | ICD-10-CM | POA: Diagnosis not present

## 2023-12-28 DIAGNOSIS — M9906 Segmental and somatic dysfunction of lower extremity: Secondary | ICD-10-CM | POA: Diagnosis not present

## 2023-12-28 NOTE — Telephone Encounter (Signed)
 This was taken care of

## 2023-12-28 NOTE — Telephone Encounter (Signed)
 Information sent

## 2024-01-01 ENCOUNTER — Other Ambulatory Visit: Payer: Self-pay | Admitting: Family Medicine

## 2024-01-01 DIAGNOSIS — F419 Anxiety disorder, unspecified: Secondary | ICD-10-CM

## 2024-01-04 ENCOUNTER — Telehealth: Payer: Self-pay

## 2024-01-04 ENCOUNTER — Ambulatory Visit: Payer: Self-pay | Admitting: Family Medicine

## 2024-01-04 NOTE — Telephone Encounter (Signed)
 Copied from CRM [id number]. Topic: Clinical - Lab/Test Results >> Jan 04, 2024  4:12 PM Sheila Harris wrote: Reason for CRM: Patient returning call from office for lab results from 06/30. Called and transferred

## 2024-01-04 NOTE — Progress Notes (Signed)
 Sheila Harris, repeat bone density shows no significant change from your prior bone test which is great.  T-score is -3.4.  Are you still seeing the endocrinologist I cannot remember.  If you are we can always forward this result to them.

## 2024-01-04 NOTE — Progress Notes (Signed)
 Hi Sheila Harris, just wanted to let you know that a T-score is the reference we used for older adults and the Z-score is what we use for younger individuals.  It is not often we order a bone density on somebody who is in their 20s or 30s but there are specific medical conditions and circumstances where we might.

## 2024-01-04 NOTE — Telephone Encounter (Signed)
 Spoke with patient - gave results of bone density as written by provider.

## 2024-01-04 NOTE — Telephone Encounter (Signed)
 Spoke with patient and informed of results.  States she no longer has an endocrinologist that she sees She  asked the difference between a T-score and Z-score After researching this  I told her that I thought the T-score was for older adult diagnosis  and  the z-score was for younger adults.  ( Just wanted to be sure that I had told her accurately)  She states she does not want to start any medications to treat this per patient.

## 2024-01-14 DIAGNOSIS — C44529 Squamous cell carcinoma of skin of other part of trunk: Secondary | ICD-10-CM | POA: Diagnosis not present

## 2024-01-18 DIAGNOSIS — H2513 Age-related nuclear cataract, bilateral: Secondary | ICD-10-CM | POA: Diagnosis not present

## 2024-01-18 DIAGNOSIS — H3562 Retinal hemorrhage, left eye: Secondary | ICD-10-CM | POA: Diagnosis not present

## 2024-01-18 DIAGNOSIS — H35723 Serous detachment of retinal pigment epithelium, bilateral: Secondary | ICD-10-CM | POA: Diagnosis not present

## 2024-01-18 DIAGNOSIS — H35363 Drusen (degenerative) of macula, bilateral: Secondary | ICD-10-CM | POA: Diagnosis not present

## 2024-01-18 DIAGNOSIS — H43812 Vitreous degeneration, left eye: Secondary | ICD-10-CM | POA: Diagnosis not present

## 2024-01-18 DIAGNOSIS — H353132 Nonexudative age-related macular degeneration, bilateral, intermediate dry stage: Secondary | ICD-10-CM | POA: Diagnosis not present

## 2024-01-24 ENCOUNTER — Ambulatory Visit

## 2024-01-25 DIAGNOSIS — M25512 Pain in left shoulder: Secondary | ICD-10-CM | POA: Diagnosis not present

## 2024-01-25 DIAGNOSIS — M542 Cervicalgia: Secondary | ICD-10-CM | POA: Diagnosis not present

## 2024-01-25 DIAGNOSIS — M25511 Pain in right shoulder: Secondary | ICD-10-CM | POA: Diagnosis not present

## 2024-01-25 DIAGNOSIS — M9907 Segmental and somatic dysfunction of upper extremity: Secondary | ICD-10-CM | POA: Diagnosis not present

## 2024-01-25 DIAGNOSIS — M9902 Segmental and somatic dysfunction of thoracic region: Secondary | ICD-10-CM | POA: Diagnosis not present

## 2024-01-25 DIAGNOSIS — M9901 Segmental and somatic dysfunction of cervical region: Secondary | ICD-10-CM | POA: Diagnosis not present

## 2024-01-25 DIAGNOSIS — R0689 Other abnormalities of breathing: Secondary | ICD-10-CM | POA: Diagnosis not present

## 2024-01-25 DIAGNOSIS — M9903 Segmental and somatic dysfunction of lumbar region: Secondary | ICD-10-CM | POA: Diagnosis not present

## 2024-01-25 DIAGNOSIS — M9908 Segmental and somatic dysfunction of rib cage: Secondary | ICD-10-CM | POA: Diagnosis not present

## 2024-02-01 ENCOUNTER — Other Ambulatory Visit: Payer: Self-pay | Admitting: Family Medicine

## 2024-02-04 ENCOUNTER — Encounter

## 2024-02-04 DIAGNOSIS — H35363 Drusen (degenerative) of macula, bilateral: Secondary | ICD-10-CM | POA: Diagnosis not present

## 2024-02-04 DIAGNOSIS — H353132 Nonexudative age-related macular degeneration, bilateral, intermediate dry stage: Secondary | ICD-10-CM | POA: Diagnosis not present

## 2024-02-04 DIAGNOSIS — H43812 Vitreous degeneration, left eye: Secondary | ICD-10-CM | POA: Diagnosis not present

## 2024-02-04 DIAGNOSIS — H3562 Retinal hemorrhage, left eye: Secondary | ICD-10-CM | POA: Diagnosis not present

## 2024-02-04 DIAGNOSIS — H2513 Age-related nuclear cataract, bilateral: Secondary | ICD-10-CM | POA: Diagnosis not present

## 2024-02-04 DIAGNOSIS — H31092 Other chorioretinal scars, left eye: Secondary | ICD-10-CM | POA: Diagnosis not present

## 2024-02-04 DIAGNOSIS — H35723 Serous detachment of retinal pigment epithelium, bilateral: Secondary | ICD-10-CM | POA: Diagnosis not present

## 2024-02-11 ENCOUNTER — Other Ambulatory Visit: Payer: Self-pay | Admitting: Family Medicine

## 2024-02-11 DIAGNOSIS — E039 Hypothyroidism, unspecified: Secondary | ICD-10-CM

## 2024-02-19 ENCOUNTER — Encounter: Payer: Self-pay | Admitting: Sports Medicine

## 2024-02-26 DIAGNOSIS — D225 Melanocytic nevi of trunk: Secondary | ICD-10-CM | POA: Diagnosis not present

## 2024-02-26 DIAGNOSIS — L814 Other melanin hyperpigmentation: Secondary | ICD-10-CM | POA: Diagnosis not present

## 2024-02-26 DIAGNOSIS — L578 Other skin changes due to chronic exposure to nonionizing radiation: Secondary | ICD-10-CM | POA: Diagnosis not present

## 2024-02-26 DIAGNOSIS — L820 Inflamed seborrheic keratosis: Secondary | ICD-10-CM | POA: Diagnosis not present

## 2024-02-29 DIAGNOSIS — M9901 Segmental and somatic dysfunction of cervical region: Secondary | ICD-10-CM | POA: Diagnosis not present

## 2024-02-29 DIAGNOSIS — M9908 Segmental and somatic dysfunction of rib cage: Secondary | ICD-10-CM | POA: Diagnosis not present

## 2024-02-29 DIAGNOSIS — M549 Dorsalgia, unspecified: Secondary | ICD-10-CM | POA: Diagnosis not present

## 2024-02-29 DIAGNOSIS — M9906 Segmental and somatic dysfunction of lower extremity: Secondary | ICD-10-CM | POA: Diagnosis not present

## 2024-02-29 DIAGNOSIS — M9907 Segmental and somatic dysfunction of upper extremity: Secondary | ICD-10-CM | POA: Diagnosis not present

## 2024-02-29 DIAGNOSIS — M722 Plantar fascial fibromatosis: Secondary | ICD-10-CM | POA: Diagnosis not present

## 2024-02-29 DIAGNOSIS — M9900 Segmental and somatic dysfunction of head region: Secondary | ICD-10-CM | POA: Diagnosis not present

## 2024-02-29 DIAGNOSIS — M9902 Segmental and somatic dysfunction of thoracic region: Secondary | ICD-10-CM | POA: Diagnosis not present

## 2024-02-29 DIAGNOSIS — M542 Cervicalgia: Secondary | ICD-10-CM | POA: Diagnosis not present

## 2024-02-29 DIAGNOSIS — G8929 Other chronic pain: Secondary | ICD-10-CM | POA: Diagnosis not present

## 2024-02-29 DIAGNOSIS — M436 Torticollis: Secondary | ICD-10-CM | POA: Diagnosis not present

## 2024-03-11 ENCOUNTER — Ambulatory Visit: Attending: Family Medicine

## 2024-03-11 DIAGNOSIS — M6281 Muscle weakness (generalized): Secondary | ICD-10-CM | POA: Insufficient documentation

## 2024-03-11 DIAGNOSIS — R279 Unspecified lack of coordination: Secondary | ICD-10-CM | POA: Diagnosis not present

## 2024-03-11 DIAGNOSIS — R293 Abnormal posture: Secondary | ICD-10-CM | POA: Insufficient documentation

## 2024-03-11 NOTE — Therapy (Signed)
 OUTPATIENT PHYSICAL THERAPY FEMALE PELVIC TREATMENT   Patient Name: Sheila Harris MRN: [id number] DOB:Jul 07, 1955, 68 y.o., female Today's Date: 03/11/2024  END OF SESSION:  PT End of Session - 03/11/24 1033     Visit Number 2    Date for Recertification  06/03/24    Authorization Type [employer] $15 copay    Progress Note Due on Visit 10    PT Start Time 1031    PT Stop Time 1059    PT Time Calculation (min) 28 min    Activity Tolerance Patient tolerated treatment well    Behavior During Therapy WFL for tasks assessed/performed           Past Medical History:  Diagnosis Date   Genital herpes    Macular degeneration    Thyroid  disease    History reviewed. No pertinent surgical history. Patient Active Problem List   Diagnosis Date Noted   Stress incontinence 12/17/2023   Chronic pain of left wrist 05/07/2023   Anxiety disorder 01/11/2023   Contusion of knee, left 11/09/2022   Metatarsalgia of right foot 03/06/2022   Iron deficiency 02/23/2022   Dry age-related macular degeneration 12/08/2021   Chronic cough 08/11/2021   Chronic foot pain, right 07/12/2021   Left shoulder pain 05/05/2021   Hormone replacement therapy (HRT) 07/08/2020   Corn of foot right, 4/5 interspace 06/07/2020   Herpes infection 11/19/2019   Osteoporosis 11/19/2019   Factor V Leiden 11/19/2019   Hypothyroidism 11/19/2019   Anxiety 11/19/2019   Trigger finger of right hand 06/07/2016   Carpal tunnel syndrome, right 02/22/2016   Primary osteoarthritis of right hand 12/30/2015   Degenerative disc disease, cervical 12/30/2015   Trigger finger of left hand 10/08/2015   Pes cavus 11/21/2013   Right lumbar radiculopathy 11/21/2013    PCP: Alvan Dorothyann BIRCH, MD  REFERRING PROVIDER: Alvan Dorothyann BIRCH, MD  REFERRING DIAG: N39.3 (ICD-10-CM) - Stress incontinence  THERAPY DIAG:  Muscle weakness (generalized) - Plan: PT plan of care cert/re-cert  Abnormal posture - Plan: PT  plan of care cert/re-cert  Unspecified lack of coordination - Plan: PT plan of care cert/re-cert  Rationale for Evaluation and Treatment: Rehabilitation  ONSET DATE: 03/2023  SUBJECTIVE:                                                                                                                                                                                           SUBJECTIVE STATEMENT: Pt states that she is getting better. She mostly notices her stress incontinence when her bladder is very full. She has continued to perform her low back exercises at least every other  day.    PAIN:  Are you having pain? No   PRECAUTIONS: None  RED FLAGS: None   WEIGHT BEARING RESTRICTIONS: No  FALLS:  Has patient fallen in last 6 months? 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  ACTIVITY LEVEL : gym 3x/week, frequent PT exercises, walks dog every dog 1-2 miles   PLOF: Independent  PATIENT GOALS: avoid   PERTINENT HISTORY:  Genital herpes, Rt lumbar radiculopathy, osteoporosis, HRT Sexual abuse: No  BOWEL MOVEMENT: Pain with bowel movement: No Type of bowel movement:Frequency 1x/day and Strain no Fully empty rectum: Yes:   Leakage: No Pads: No Fiber supplement/laxative No  URINATION: Pain with urination: No Fully empty bladder: Yes:   Stream: Strong Urgency: Yes  Frequency: every 2 hours; 1x.night Fluid Intake: about a gallon of water a day Leakage: Coughing, Sneezing, and Laughing Pads: No  INTERCOURSE:  Not sexually active, some history of pain in the past (10 years ago)  PREGNANCY: Vaginal deliveries 1 Tearing No Episiotomy No C-section deliveries 0 Currently pregnant No  PROLAPSE: None   OBJECTIVE:  Note: Objective measures were completed at Evaluation unless otherwise noted. 03/11/24: Core activation: able to achieve with multimodal cues  Squat: Rt weight shift Single leg stance:  Rt: notable pelvic drop  Lt: initially level, but  could not maintain after 5 seconds Curl-up test: small upper abdominal distortion and felt like she had to support head No formal pelvic floor muscle exam du eto just having performed last session   12/25/23: PATIENT SURVEYS:   PFIQ-7: 19  COGNITION: Overall cognitive status: Within functional limits for tasks assessed     SENSATION: Light touch: Appears intact   FUNCTIONAL TESTS:  Squat: Rt weight shift Single leg stance:  Rt: notable pelvic drop  Lt: initially level, but could not maintain after 5 seconds Curl-up test: small upper abdominal distortion and felt like she had to support head   GAIT: Assistive device utilized: NONE Comments: WNL  POSTURE: decreased lumbar lordosis, decreased thoracic kyphosis, anterior pelvic tilt, and Rt posterior pelvic rotation, small Lt thoracic/Lt lumbar scoliosis   LUMBARAROM/PROM:  A/PROM A/PROM  Eval (% available)  Flexion 50  Extension 50  Right lateral flexion 50  Left lateral flexion 50  Right rotation 50  Left rotation 50   (Blank rows = not tested)  PALPATION:   General: tightness throughout Rt lower quadrant and glutes; gluteal clenching  Pelvic Alignment: Rt posterior rotation; overall anterior pelvic tilt with gluteal clenching  Abdominal: WNL                External Perineal Exam: some palor in upper vestibule with white patches                             Internal Pelvic Floor: atrophy noticeable, no tenderness, good moisture levels  Patient confirms identification and approves PT to assess internal pelvic floor and treatment Yes  PELVIC MMT:   MMT eval  Vaginal 2/5, 4 second hold, 3 repeat contractions  Diastasis Recti 1-2 finger width separation  (Blank rows = not tested)        TONE: low  PROLAPSE: Small grade 1 anterior vaginal wall alxity  TODAY'S TREATMENT:  DATE:   03/11/24 RE-EVAL Neuromuscular re-education: Transversus abdominus training with multimodal cues for improved motor control and breath coordination Bil supine UE ball press with transversus abdominus and pelvic floor muscle contractions and breath coordination 10x Supine hip adduction ball press with transversus abdominus and pelvic floor muscle contractions and breath coordination 10x Bridge with hip adduction, transversus abdominus, and pelvic floor muscle 2 x 10 Modified dead bug 10x bil Seated hip abduction red band with transversus abdominus and pelvic floor muscle 2 x 10 Seated resisted march red band with transversus abdominus and pelvic floor muscle 2 x 10  12/25/23 EVAL  Neuromuscular re-education: Pt provides verbal consent for internal vaginal/rectal pelvic floor exam. Internal vaginal pelvic floor muscle contraction training Quick flicks Long holds Urge drill The knack   PATIENT EDUCATION:  Education details: See above Person educated: Patient Education method: Explanation, Demonstration, Tactile cues, Verbal cues, and Handouts Education comprehension: verbalized understanding  HOME EXERCISE PROGRAM: [id number]  ASSESSMENT:  CLINICAL IMPRESSION: Patient is a 68 y.o. female seen for stress urinary continence with coughing, laughing, and sneezing. Pt has not been seen in 2.5 months since her initial evaluation. She still demonstrates functional core strength and no formal pelvic floor muscle exam performed this session. She can activate her deep core with exhale and pelvic floor muscles with multimodal cues. She is seeing progress with stress urinary incontinence and only is noticing it when her bladder is full or she hs very strong cough. Exercises were progressed today with good tolerance and HEP updated. She will continue to benefit from skilled PT intervention in order to decrease stress urinary incontinence, address impairments, and progress functional strengthening  program.   OBJECTIVE IMPAIRMENTS: decreased activity tolerance, decreased coordination, decreased endurance, decreased mobility, decreased ROM, decreased strength, increased fascial restrictions, increased muscle spasms, impaired flexibility, impaired tone, improper body mechanics, postural dysfunction, and pain.   ACTIVITY LIMITATIONS: squatting and continence  PARTICIPATION LIMITATIONS: community activity  PERSONAL FACTORS: 1 comorbidity: medical history are also affecting patient's functional outcome.   REHAB POTENTIAL: Good  CLINICAL DECISION MAKING: Stable/uncomplicated  EVALUATION COMPLEXITY: Low   GOALS: Goals reviewed with patient? Yes  SHORT TERM GOALS: Target date: 01/22/2024   Pt will be independent with HEP.   Baseline: Goal status: MET 03/11/24  2.  Pt will report 25% improvement in urinary incontinence in order to improve confidence with community activity.  Baseline:  Goal status: MET 03/11/24  3.  Pt will be independent with the knack and urge suppression technique in order to improve bladder habits and decrease urinary incontinence.   Baseline:  Goal status: MET 03/11/24  4.  Pt will increase pelvic floor muscle strength to 3/5 in order to decrease urinary incontinence with laughing, coughing, and sneezing.  Baseline:  Goal status: IN PROGRESS 03/11/24   LONG TERM GOALS: Target date: 03/18/2024  Pt will be independent with advanced HEP.   Baseline:  Goal status: IN PROGRESS 03/11/24  2.  Pt will report 75% improvement in urinary incontinence in order to improve confidence with community activity.  Baseline:  Goal status: IN PROGRESS 03/11/24  3.  Pt will be able to go 2-3 hours in between voids without urgency or incontinence in order to improve QOL and perform all functional activities with less difficulty.   Baseline:  Goal status: IN PROGRESS 03/11/24  4.  Pt will increase pelvic floor muscle endurance to >10 seconds in order to decrease urinary  incontinence with laughing, coughing, and sneezing.  Baseline:  Goal status:  IN PROGRESS 03/11/24  PLAN:  PT FREQUENCY: 1-2x/week  PT DURATION: 12 weeks    PLANNED INTERVENTIONS: 97110-Therapeutic exercises, 97530- Therapeutic activity, 97112- Neuromuscular re-education, 97535- Self Care, 02859- Manual therapy, Dry Needling, and Biofeedback  PLAN FOR NEXT SESSION: progress pelvic floor muscle strengthening into functional activities  Josette Mares, PT, DPT09/23/2511:01 AM

## 2024-03-20 DIAGNOSIS — M9902 Segmental and somatic dysfunction of thoracic region: Secondary | ICD-10-CM | POA: Diagnosis not present

## 2024-03-20 DIAGNOSIS — M9905 Segmental and somatic dysfunction of pelvic region: Secondary | ICD-10-CM | POA: Diagnosis not present

## 2024-03-20 DIAGNOSIS — M9903 Segmental and somatic dysfunction of lumbar region: Secondary | ICD-10-CM | POA: Diagnosis not present

## 2024-03-20 DIAGNOSIS — M9901 Segmental and somatic dysfunction of cervical region: Secondary | ICD-10-CM | POA: Diagnosis not present

## 2024-04-07 DIAGNOSIS — M9906 Segmental and somatic dysfunction of lower extremity: Secondary | ICD-10-CM | POA: Diagnosis not present

## 2024-04-07 DIAGNOSIS — M9904 Segmental and somatic dysfunction of sacral region: Secondary | ICD-10-CM | POA: Diagnosis not present

## 2024-04-07 DIAGNOSIS — M9905 Segmental and somatic dysfunction of pelvic region: Secondary | ICD-10-CM | POA: Diagnosis not present

## 2024-04-07 DIAGNOSIS — G8929 Other chronic pain: Secondary | ICD-10-CM | POA: Diagnosis not present

## 2024-04-07 DIAGNOSIS — M545 Low back pain, unspecified: Secondary | ICD-10-CM | POA: Diagnosis not present

## 2024-04-07 DIAGNOSIS — M9908 Segmental and somatic dysfunction of rib cage: Secondary | ICD-10-CM | POA: Diagnosis not present

## 2024-04-07 DIAGNOSIS — M25562 Pain in left knee: Secondary | ICD-10-CM | POA: Diagnosis not present

## 2024-04-07 DIAGNOSIS — M9902 Segmental and somatic dysfunction of thoracic region: Secondary | ICD-10-CM | POA: Diagnosis not present

## 2024-04-07 DIAGNOSIS — M9903 Segmental and somatic dysfunction of lumbar region: Secondary | ICD-10-CM | POA: Diagnosis not present

## 2024-04-08 DIAGNOSIS — M65331 Trigger finger, right middle finger: Secondary | ICD-10-CM | POA: Diagnosis not present

## 2024-04-24 ENCOUNTER — Other Ambulatory Visit: Payer: Self-pay | Admitting: Family Medicine

## 2024-04-28 DIAGNOSIS — H43812 Vitreous degeneration, left eye: Secondary | ICD-10-CM | POA: Diagnosis not present

## 2024-04-28 DIAGNOSIS — H35363 Drusen (degenerative) of macula, bilateral: Secondary | ICD-10-CM | POA: Diagnosis not present

## 2024-04-28 DIAGNOSIS — H353132 Nonexudative age-related macular degeneration, bilateral, intermediate dry stage: Secondary | ICD-10-CM | POA: Diagnosis not present

## 2024-04-28 DIAGNOSIS — H2513 Age-related nuclear cataract, bilateral: Secondary | ICD-10-CM | POA: Diagnosis not present

## 2024-04-28 DIAGNOSIS — H35723 Serous detachment of retinal pigment epithelium, bilateral: Secondary | ICD-10-CM | POA: Diagnosis not present

## 2024-04-28 DIAGNOSIS — H31092 Other chorioretinal scars, left eye: Secondary | ICD-10-CM | POA: Diagnosis not present

## 2024-04-30 ENCOUNTER — Other Ambulatory Visit: Payer: Self-pay | Admitting: Family Medicine

## 2024-05-05 ENCOUNTER — Ambulatory Visit: Attending: Family Medicine

## 2024-05-05 DIAGNOSIS — M6281 Muscle weakness (generalized): Secondary | ICD-10-CM | POA: Insufficient documentation

## 2024-05-05 DIAGNOSIS — R279 Unspecified lack of coordination: Secondary | ICD-10-CM | POA: Diagnosis not present

## 2024-05-05 DIAGNOSIS — R293 Abnormal posture: Secondary | ICD-10-CM | POA: Diagnosis not present

## 2024-05-05 NOTE — Therapy (Signed)
 OUTPATIENT PHYSICAL THERAPY FEMALE PELVIC TREATMENT   Patient Name: Sheila Harris MRN: [id number] DOB:06-21-1955, 68 y.o., female Today's Date: 05/05/2024  END OF SESSION:  PT End of Session - 05/05/24 1407     Visit Number 3    Date for Recertification  06/03/24    Authorization Type [employer] $15 copay    Progress Note Due on Visit 10    PT Start Time 1407    PT Stop Time 1445    PT Time Calculation (min) 38 min    Activity Tolerance Patient tolerated treatment well    Behavior During Therapy WFL for tasks assessed/performed            Past Medical History:  Diagnosis Date   Genital herpes    Macular degeneration    Thyroid  disease    History reviewed. No pertinent surgical history. Patient Active Problem List   Diagnosis Date Noted   Stress incontinence 12/17/2023   Chronic pain of left wrist 05/07/2023   Anxiety disorder 01/11/2023   Contusion of knee, left 11/09/2022   Metatarsalgia of right foot 03/06/2022   Iron deficiency 02/23/2022   Dry age-related macular degeneration 12/08/2021   Chronic cough 08/11/2021   Chronic foot pain, right 07/12/2021   Left shoulder pain 05/05/2021   Hormone replacement therapy (HRT) 07/08/2020   Corn of foot right, 4/5 interspace 06/07/2020   Herpes infection 11/19/2019   Osteoporosis 11/19/2019   Factor V Leiden 11/19/2019   Hypothyroidism 11/19/2019   Anxiety 11/19/2019   Trigger finger of right hand 06/07/2016   Carpal tunnel syndrome, right 02/22/2016   Primary osteoarthritis of right hand 12/30/2015   Degenerative disc disease, cervical 12/30/2015   Trigger finger of left hand 10/08/2015   Pes cavus 11/21/2013   Right lumbar radiculopathy 11/21/2013    PCP: Alvan Dorothyann BIRCH, MD  REFERRING PROVIDER: Alvan Dorothyann BIRCH, MD  REFERRING DIAG: N39.3 (ICD-10-CM) - Stress incontinence  THERAPY DIAG:  Muscle weakness (generalized)  Abnormal posture  Unspecified lack of  coordination  Rationale for Evaluation and Treatment: Rehabilitation  ONSET DATE: 03/2023  SUBJECTIVE:                                                                                                                                                                                           SUBJECTIVE STATEMENT: Pt states that she is doing very well. She has large improvement in bladder control unless she is a very full bladder.    PAIN:  Are you having pain? No   PRECAUTIONS: None  RED FLAGS: None   WEIGHT BEARING RESTRICTIONS: No  FALLS:  Has patient fallen  in last 6 months? 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  ACTIVITY LEVEL : gym 3x/week, frequent PT exercises, walks dog every dog 1-2 miles   PLOF: Independent  PATIENT GOALS: avoid   PERTINENT HISTORY:  Genital herpes, Rt lumbar radiculopathy, osteoporosis, HRT Sexual abuse: No  BOWEL MOVEMENT: Pain with bowel movement: No Type of bowel movement:Frequency 1x/day and Strain no Fully empty rectum: Yes:   Leakage: No Pads: No Fiber supplement/laxative No  URINATION: Pain with urination: No Fully empty bladder: Yes:   Stream: Strong Urgency: Yes  Frequency: every 2 hours; 1x.night Fluid Intake: about a gallon of water a day Leakage: Coughing, Sneezing, and Laughing Pads: No  INTERCOURSE:  Not sexually active, some history of pain in the past (10 years ago)  PREGNANCY: Vaginal deliveries 1 Tearing No Episiotomy No C-section deliveries 0 Currently pregnant No  PROLAPSE: None   OBJECTIVE:  Note: Objective measures were completed at Evaluation unless otherwise noted. 03/11/24: Core activation: able to achieve with multimodal cues  Squat: Rt weight shift Single leg stance:  Rt: notable pelvic drop  Lt: initially level, but could not maintain after 5 seconds Curl-up test: small upper abdominal distortion and felt like she had to support head No formal pelvic floor muscle exam  du eto just having performed last session   12/25/23: PATIENT SURVEYS:   PFIQ-7: 19  COGNITION: Overall cognitive status: Within functional limits for tasks assessed     SENSATION: Light touch: Appears intact   FUNCTIONAL TESTS:  Squat: Rt weight shift Single leg stance:  Rt: notable pelvic drop  Lt: initially level, but could not maintain after 5 seconds Curl-up test: small upper abdominal distortion and felt like she had to support head   GAIT: Assistive device utilized: NONE Comments: WNL  POSTURE: decreased lumbar lordosis, decreased thoracic kyphosis, anterior pelvic tilt, and Rt posterior pelvic rotation, small Lt thoracic/Lt lumbar scoliosis   LUMBARAROM/PROM:  A/PROM A/PROM  Eval (% available)  Flexion 50  Extension 50  Right lateral flexion 50  Left lateral flexion 50  Right rotation 50  Left rotation 50   (Blank rows = not tested)  PALPATION:   General: tightness throughout Rt lower quadrant and glutes; gluteal clenching  Pelvic Alignment: Rt posterior rotation; overall anterior pelvic tilt with gluteal clenching  Abdominal: WNL                External Perineal Exam: some palor in upper vestibule with white patches                             Internal Pelvic Floor: atrophy noticeable, no tenderness, good moisture levels  Patient confirms identification and approves PT to assess internal pelvic floor and treatment Yes  PELVIC MMT:   MMT eval  Vaginal 2/5, 4 second hold, 3 repeat contractions  Diastasis Recti 1-2 finger width separation  (Blank rows = not tested)        TONE: low  PROLAPSE: Small grade 1 anterior vaginal wall alxity  TODAY'S TREATMENT:  DATE:  05/05/24 Neuromuscular re-education: Standing chop + 3 lbs 10x bil Standing march with anterior weight hold + 3 lbs 2 x 10 Therapeutic activities: Squats  with anterior weight hold 2 x 10 Pallof press + green band 2 x 10 Standing bil shoulder extension + green band 2 x 10 Standing bent rows 5 lbs 10x bil 3 way kick 10x each, bil    03/11/24 RE-EVAL Neuromuscular re-education: Transversus abdominus training with multimodal cues for improved motor control and breath coordination Bil supine UE ball press with transversus abdominus and pelvic floor muscle contractions and breath coordination 10x Supine hip adduction ball press with transversus abdominus and pelvic floor muscle contractions and breath coordination 10x Bridge with hip adduction, transversus abdominus, and pelvic floor muscle 2 x 10 Modified dead bug 10x bil Seated hip abduction red band with transversus abdominus and pelvic floor muscle 2 x 10 Seated resisted march red band with transversus abdominus and pelvic floor muscle 2 x 10  12/25/23 EVAL  Neuromuscular re-education: Pt provides verbal consent for internal vaginal/rectal pelvic floor exam. Internal vaginal pelvic floor muscle contraction training Quick flicks Long holds Urge drill The knack   PATIENT EDUCATION:  Education details: See above Person educated: Patient Education method: Explanation, Demonstration, Tactile cues, Verbal cues, and Handouts Education comprehension: verbalized understanding  HOME EXERCISE PROGRAM: [id number]  ASSESSMENT:  CLINICAL IMPRESSION: Patient is a 68 y.o. female seen for stress urinary continence with coughing, laughing, and sneezing. Pt has been doing very well with improved bladder control. She feels prepared for discharge due to this improved control. Due to having made excellent progress towards goal completion and functional improvement, she is prepared to discharge at this time. She was encouraged to call with any questions or concerns.   OBJECTIVE IMPAIRMENTS: decreased activity tolerance, decreased coordination, decreased endurance, decreased mobility, decreased ROM,  decreased strength, increased fascial restrictions, increased muscle spasms, impaired flexibility, impaired tone, improper body mechanics, postural dysfunction, and pain.   ACTIVITY LIMITATIONS: squatting and continence  PARTICIPATION LIMITATIONS: community activity  PERSONAL FACTORS: 1 comorbidity: medical history are also affecting patient's functional outcome.   REHAB POTENTIAL: Good  CLINICAL DECISION MAKING: Stable/uncomplicated  EVALUATION COMPLEXITY: Low   GOALS: Goals reviewed with patient? Yes  SHORT TERM GOALS: Target date: 01/22/2024   Pt will be independent with HEP.   Baseline: Goal status: MET 03/11/24  2.  Pt will report 25% improvement in urinary incontinence in order to improve confidence with community activity.  Baseline:  Goal status: MET 03/11/24  3.  Pt will be independent with the knack and urge suppression technique in order to improve bladder habits and decrease urinary incontinence.   Baseline:  Goal status: MET 03/11/24  4.  Pt will increase pelvic floor muscle strength to 3/5 in order to decrease urinary incontinence with laughing, coughing, and sneezing.  Baseline:  Goal status: DISCHARGED 05/05/24   LONG TERM GOALS: Target date: 03/18/2024  Pt will be independent with advanced HEP.   Baseline:  Goal status: MET 05/05/24  2.  Pt will report 75% improvement in urinary incontinence in order to improve confidence with community activity.  Baseline:  Goal status: MET 05/05/24  3.  Pt will be able to go 2-3 hours in between voids without urgency or incontinence in order to improve QOL and perform all functional activities with less difficulty.   Baseline:  Goal status: MET 05/05/24  4.  Pt will increase pelvic floor muscle endurance to >10 seconds in order to decrease urinary  incontinence with laughing, coughing, and sneezing.  Baseline:  Goal status: DISCHARGED 05/05/24  PLAN:  PT FREQUENCY: -  PT DURATION: -  PLANNED INTERVENTIONS:  -  PLAN FOR NEXT SESSION: D/C   PHYSICAL THERAPY DISCHARGE SUMMARY  Visits from Start of Care: 3  Current functional level related to goals / functional outcomes: Independent   Remaining deficits: See above   Education / Equipment: HEP   Patient agrees to discharge. Patient goals were partially met. Patient is being discharged due to being pleased with the current functional level.  Josette Mares, PT, DPT11/17/252:37 PM

## 2024-05-13 ENCOUNTER — Ambulatory Visit

## 2024-05-20 ENCOUNTER — Encounter

## 2024-05-20 DIAGNOSIS — M7741 Metatarsalgia, right foot: Secondary | ICD-10-CM | POA: Diagnosis not present

## 2024-05-20 DIAGNOSIS — M25571 Pain in right ankle and joints of right foot: Secondary | ICD-10-CM | POA: Diagnosis not present

## 2024-05-20 DIAGNOSIS — B079 Viral wart, unspecified: Secondary | ICD-10-CM | POA: Diagnosis not present

## 2024-05-21 DIAGNOSIS — M25562 Pain in left knee: Secondary | ICD-10-CM | POA: Diagnosis not present

## 2024-05-29 DIAGNOSIS — M25562 Pain in left knee: Secondary | ICD-10-CM | POA: Diagnosis not present

## 2024-06-07 ENCOUNTER — Other Ambulatory Visit: Payer: Self-pay | Admitting: Family Medicine

## 2024-06-18 ENCOUNTER — Telehealth: Payer: Self-pay

## 2024-06-18 NOTE — Telephone Encounter (Signed)
 Patients spouse came into office to drop off Patient Assistance paperwork, forms faxed successfully, thanks.

## 2024-07-18 ENCOUNTER — Other Ambulatory Visit: Payer: Self-pay | Admitting: Family Medicine

## 2024-07-21 ENCOUNTER — Encounter: Admitting: Family Medicine

## 2024-07-28 ENCOUNTER — Encounter: Admitting: Family Medicine
